# Patient Record
Sex: Female | Born: 2000 | Race: White | Hispanic: No | Marital: Single | Smoking: Never smoker
Health system: Southern US, Community
[De-identification: ages and names within clinical notes are randomized; demographics above are authoritative.]

## PROBLEM LIST (undated history)

## (undated) DIAGNOSIS — N390 Urinary tract infection, site not specified: Secondary | ICD-10-CM

## (undated) DIAGNOSIS — J45909 Unspecified asthma, uncomplicated: Secondary | ICD-10-CM

## (undated) DIAGNOSIS — O139 Gestational [pregnancy-induced] hypertension without significant proteinuria, unspecified trimester: Secondary | ICD-10-CM

## (undated) HISTORY — DX: Gestational (pregnancy-induced) hypertension without significant proteinuria, unspecified trimester: O13.9

---
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
## 2019-01-17 ENCOUNTER — Other Ambulatory Visit: Payer: Self-pay

## 2019-01-17 ENCOUNTER — Encounter: Payer: Self-pay | Admitting: Obstetrics and Gynecology

## 2019-01-17 ENCOUNTER — Other Ambulatory Visit (HOSPITAL_COMMUNITY)
Admission: RE | Admit: 2019-01-17 | Discharge: 2019-01-17 | Disposition: A | Discharge status: Home / Self Care | Payer: Medicaid Other | Source: Ambulatory Visit | Attending: Obstetrics and Gynecology | Admitting: Obstetrics and Gynecology

## 2019-01-17 ENCOUNTER — Ambulatory Visit (INDEPENDENT_AMBULATORY_CARE_PROVIDER_SITE_OTHER): Payer: Medicaid Other | Admitting: Obstetrics and Gynecology

## 2019-01-17 ENCOUNTER — Ambulatory Visit: Payer: Medicaid Other | Admitting: Obstetrics and Gynecology

## 2019-01-17 VITALS — BP 118/72 | HR 70 | Ht 63.0 in | Wt 169.0 lb

## 2019-01-17 DIAGNOSIS — N941 Unspecified dyspareunia: Secondary | ICD-10-CM | POA: Diagnosis not present

## 2019-01-17 NOTE — Progress Notes (Signed)
GYN presents for painful sex on deep penetration x 6 days.  Denies discharge, odor or abdominal pain.

## 2019-01-17 NOTE — Progress Notes (Signed)
18 yo P0 here for the evaluation of dyspareunia. Patient states that this started almost a week ago. She describes lower abdominal pain with deep penetration irrespective of sexual position. She also reports a similar pain prior to urination. Patient denies any abnormal vaginal bleeding or discharge. She denies any fever  Past Medical History:  Diagnosis Date  . UTI (urinary tract infection)    Past Surgical History:  Procedure Laterality Date  . TONSILLECTOMY AND ADENOIDECTOMY Bilateral 09/29/2012   Procedure: TONSILLECTOMY AND ADENOIDECTOMY;  Surgeon: Ruby Cola, MD;  Location: [facility] OR;  Service: ENT;  Laterality: Bilateral;   Family History  Problem Relation Age of Onset  . Cervical cancer Mother   . Ovarian cancer Maternal Grandmother    Social History   Tobacco Use  . Smoking status: Passive Smoke Exposure - Never Smoker  . Smokeless tobacco: Never Used  Substance Use Topics  . Alcohol use: No  . Drug use: No   ROS See pertinent in HPI  Blood pressure 118/72, pulse 70, height 5\' 3"  (1.6 m), weight 169 lb (76.7 kg), last menstrual period 12/17/2018. GENERAL: Well-developed, well-nourished female in no acute distress.  ABDOMEN: Soft, nontender, nondistended. No organomegaly. PELVIC: Normal external female genitalia. Vagina is pink and rugated.  Normal discharge. Normal appearing cervix. Uterus is normal in size. No adnexal mass or tenderness. EXTREMITIES: No cyanosis, clubbing, or edema, 2+ distal pulses.  A/P 18 yo with dysparunia - Urine culture and vaginal swab collected - Patient will be contacted with any abnormal results - May need to refer to pelvic PT if all results are negative - RTC prn

## 2019-01-19 LAB — URINE CULTURE

## 2019-01-25 LAB — CERVICOVAGINAL ANCILLARY ONLY
Bacterial Vaginitis (gardnerella): NEGATIVE
Candida Glabrata: NEGATIVE
Candida Vaginitis: NEGATIVE
Chlamydia: NEGATIVE
Comment: NEGATIVE
Comment: NEGATIVE
Comment: NEGATIVE
Comment: NEGATIVE
Comment: NEGATIVE
Comment: NORMAL
Neisseria Gonorrhea: NEGATIVE
Trichomonas: NEGATIVE

## 2019-01-26 NOTE — Addendum Note (Signed)
Addended by: Mora Bellman on: 01/26/2019 08:23 AM   Modules accepted: Orders

## 2019-01-29 ENCOUNTER — Ambulatory Visit: Payer: Medicaid Other | Admitting: Physical Therapy

## 2019-02-06 ENCOUNTER — Ambulatory Visit: Payer: Medicaid Other | Attending: Obstetrics and Gynecology | Admitting: Physical Therapy

## 2019-02-12 MED ORDER — SLYND 4 MG PO TABS: 1.0000 | ORAL_TABLET | Freq: Every day | ORAL | 4 refills | Status: DC

## 2019-03-16 DIAGNOSIS — Z3041 Encounter for surveillance of contraceptive pills: Secondary | ICD-10-CM | POA: Diagnosis not present

## 2019-03-16 DIAGNOSIS — L708 Other acne: Secondary | ICD-10-CM | POA: Diagnosis not present

## 2019-04-03 DIAGNOSIS — H16223 Keratoconjunctivitis sicca, not specified as Sjogren's, bilateral: Secondary | ICD-10-CM | POA: Diagnosis not present

## 2019-04-03 DIAGNOSIS — H40033 Anatomical narrow angle, bilateral: Secondary | ICD-10-CM | POA: Diagnosis not present

## 2019-04-05 DIAGNOSIS — H5213 Myopia, bilateral: Secondary | ICD-10-CM | POA: Diagnosis not present

## 2019-05-07 ENCOUNTER — Encounter (HOSPITAL_COMMUNITY): Payer: Self-pay | Admitting: *Deleted

## 2019-05-07 ENCOUNTER — Other Ambulatory Visit: Payer: Self-pay | Admitting: Advanced Practice Midwife

## 2019-05-07 ENCOUNTER — Inpatient Hospital Stay (HOSPITAL_COMMUNITY): Payer: Medicaid Other

## 2019-05-07 ENCOUNTER — Inpatient Hospital Stay (HOSPITAL_COMMUNITY)
Admission: EM | Admit: 2019-05-07 | Discharge: 2019-05-07 | Disposition: A | Discharge status: Home / Self Care | Payer: Medicaid Other | Attending: Obstetrics & Gynecology | Admitting: Obstetrics & Gynecology

## 2019-05-07 DIAGNOSIS — O26891 Other specified pregnancy related conditions, first trimester: Secondary | ICD-10-CM | POA: Insufficient documentation

## 2019-05-07 DIAGNOSIS — Z7722 Contact with and (suspected) exposure to environmental tobacco smoke (acute) (chronic): Secondary | ICD-10-CM | POA: Diagnosis not present

## 2019-05-07 DIAGNOSIS — Z3A01 Less than 8 weeks gestation of pregnancy: Secondary | ICD-10-CM

## 2019-05-07 DIAGNOSIS — O3680X0 Pregnancy with inconclusive fetal viability, not applicable or unspecified: Secondary | ICD-10-CM | POA: Diagnosis not present

## 2019-05-07 DIAGNOSIS — R109 Unspecified abdominal pain: Secondary | ICD-10-CM | POA: Insufficient documentation

## 2019-05-07 LAB — ABO/RH: ABO/RH(D): O NEG

## 2019-05-07 LAB — CBC
HCT: 40.4 % (ref 36.0–46.0)
Hemoglobin: 13.7 g/dL (ref 12.0–15.0)
MCH: 30.3 pg (ref 26.0–34.0)
MCHC: 33.9 g/dL (ref 30.0–36.0)
MCV: 89.4 fL (ref 80.0–100.0)
Platelets: 221 10*3/uL (ref 150–400)
RBC: 4.52 MIL/uL (ref 3.87–5.11)
RDW: 13.2 % (ref 11.5–15.5)
WBC: 7.7 10*3/uL (ref 4.0–10.5)
nRBC: 0 % (ref 0.0–0.2)

## 2019-05-07 LAB — POCT PREGNANCY, URINE: Preg Test, Ur: POSITIVE — AB

## 2019-05-07 LAB — URINALYSIS, ROUTINE W REFLEX MICROSCOPIC
Bilirubin Urine: NEGATIVE
Glucose, UA: NEGATIVE mg/dL
Hgb urine dipstick: NEGATIVE
Ketones, ur: NEGATIVE mg/dL
Leukocytes,Ua: NEGATIVE
Nitrite: NEGATIVE
Protein, ur: NEGATIVE mg/dL
Specific Gravity, Urine: 1.012 (ref 1.005–1.030)
pH: 7 (ref 5.0–8.0)

## 2019-05-07 LAB — HCG, QUANTITATIVE, PREGNANCY: hCG, Beta Chain, Quant, S: 2412 m[IU]/mL — ABNORMAL HIGH (ref <5)

## 2019-05-07 LAB — WET PREP, GENITAL
Clue Cells Wet Prep HPF POC: NONE SEEN
Sperm: NONE SEEN
Trich, Wet Prep: NONE SEEN
Yeast Wet Prep HPF POC: NONE SEEN

## 2019-05-07 NOTE — MAU Note (Signed)
Burgess Amor PA called for provider to provider report, took report on patient due to no provider being on the unit for report.

## 2019-05-07 NOTE — ED Triage Notes (Signed)
Pt states she is 5w 5d pregnant.  C/o lower abd cramping.  Denies vaginal bleeding.  Burgess Amor, PA at triage for [facility].  Report called to Marisue Humble in MAU and pt transported to MAU in wheelchair.

## 2019-05-07 NOTE — MAU Note (Signed)
.   Angela Calderon is a 19 y.o. at [redacted]w[redacted]d here in MAU reporting: 6 positive pregnancy tests at home. Lower abdominal cramping started yesterday morning at 11am. Denies any VB. Pt states that she has had high blood before when she was on birth control . LMP: 03/28/19 Onset of complaint: yesterday Pain score: 4 Vitals:   05/07/19 0729 05/07/19 0801  BP: 132/82 (!) 146/78  Pulse: 100 85  Resp: 16 16  Temp: 98.9 F (37.2 C) 98.8 F (37.1 C)  SpO2: 100%      FHT: Lab orders placed from triage: UA/UPT

## 2019-05-07 NOTE — Discharge Instructions (Signed)
Return to care   If you have heavier bleeding that soaks through more that 2 pads per hour for an hour or more  If you bleed so much that you feel like you might pass out or you do pass out  If you have significant abdominal pain that is not improved with Tylenol   If you develop a fever > 100.5     First Trimester of Pregnancy  The first trimester of pregnancy is from week 1 until the end of week 13 (months 1 through 3). During this time, your baby will begin to develop inside you. At 6-8 weeks, the eyes and face are formed, and the heartbeat can be seen on ultrasound. At the end of 12 weeks, all the baby's organs are formed. Prenatal care is all the medical care you receive before the birth of your baby. Make sure you get good prenatal care and follow all of your doctor's instructions. Follow these instructions at home: Medicines  Take over-the-counter and prescription medicines only as told by your doctor. Some medicines are safe and some medicines are not safe during pregnancy.  Take a prenatal vitamin that contains at least 600 micrograms (mcg) of folic acid.  If you have trouble pooping (constipation), take medicine that will make your stool soft (stool softener) if your doctor approves. Eating and drinking   Eat regular, healthy meals.  Your doctor will tell you the amount of weight gain that is right for you.  Avoid raw meat and uncooked cheese.  If you feel sick to your stomach (nauseous) or throw up (vomit): ? Eat 4 or 5 small meals a day instead of 3 large meals. ? Try eating a few soda crackers. ? Drink liquids between meals instead of during meals.  To prevent constipation: ? Eat foods that are high in fiber, like fresh fruits and vegetables, whole grains, and beans. ? Drink enough fluids to keep your pee (urine) clear or pale yellow. Activity  Exercise only as told by your doctor. Stop exercising if you have cramps or pain in your lower belly (abdomen) or low  back.  Do not exercise if it is too hot, too humid, or if you are in a place of great height (high altitude).  Try to avoid standing for long periods of time. Move your legs often if you must stand in one place for a long time.  Avoid heavy lifting.  Wear low-heeled shoes. Sit and stand up straight.  You can have sex unless your doctor tells you not to. Relieving pain and discomfort  Wear a good support bra if your breasts are sore.  Take warm water baths (sitz baths) to soothe pain or discomfort caused by hemorrhoids. Use hemorrhoid cream if your doctor says it is okay.  Rest with your legs raised if you have leg cramps or low back pain.  If you have puffy, bulging veins (varicose veins) in your legs: ? Wear support hose or compression stockings as told by your doctor. ? Raise (elevate) your feet for 15 minutes, 3-4 times a day. ? Limit salt in your food. Prenatal care  Schedule your prenatal visits by the twelfth week of pregnancy.  Write down your questions. Take them to your prenatal visits.  Keep all your prenatal visits as told by your doctor. This is important. Safety  Wear your seat belt at all times when driving.  Make a list of emergency phone numbers. The list should include numbers for family, friends, the hospital,  and police and fire departments. General instructions  Ask your doctor for a referral to a local prenatal class. Begin classes no later than at the start of month 6 of your pregnancy.  Ask for help if you need counseling or if you need help with nutrition. Your doctor can give you advice or tell you where to go for help.  Do not use hot tubs, steam rooms, or saunas.  Do not douche or use tampons or scented sanitary pads.  Do not cross your legs for long periods of time.  Avoid all herbs and alcohol. Avoid drugs that are not approved by your doctor.  Do not use any tobacco products, including cigarettes, chewing tobacco, and electronic  cigarettes. If you need help quitting, ask your doctor. You may get counseling or other support to help you quit.  Avoid cat litter boxes and soil used by cats. These carry germs that can cause birth defects in the baby and can cause a loss of your baby (miscarriage) or stillbirth.  Visit your dentist. At home, brush your teeth with a soft toothbrush. Be gentle when you floss. Contact a doctor if:  You are dizzy.  You have mild cramps or pressure in your lower belly.  You have a nagging pain in your belly area.  You continue to feel sick to your stomach, you throw up, or you have watery poop (diarrhea).  You have a bad smelling fluid coming from your vagina.  You have pain when you pee (urinate).  You have increased puffiness (swelling) in your face, hands, legs, or ankles. Get help right away if:  You have a fever.  You are leaking fluid from your vagina.  You have spotting or bleeding from your vagina.  You have very bad belly cramping or pain.  You gain or lose weight rapidly.  You throw up blood. It may look like coffee grounds.  You are around people who have [place] measles, fifth disease, or chickenpox.  You have a very bad headache.  You have shortness of breath.  You have any kind of trauma, such as from a fall or a car accident. Summary  The first trimester of pregnancy is from week 1 until the end of week 13 (months 1 through 3).  To take care of yourself and your unborn baby, you will need to eat healthy meals, take medicines only if your doctor tells you to do so, and do activities that are safe for you and your baby.  Keep all follow-up visits as told by your doctor. This is important as your doctor will have to ensure that your baby is healthy and growing well. This information is not intended to replace advice given to you by your health care provider. Make sure you discuss any questions you have with your health care provider. Document Revised:  07/20/2018 Document Reviewed: 04/06/2016 Elsevier Patient Education  2020 [employer].

## 2019-05-07 NOTE — MAU Provider Note (Signed)
Chief Complaint: Abdominal Pain   First Provider Initiated Contact with Patient 05/07/19 0818     SUBJECTIVE HPI: Angela Calderon is a 19 y.o. G1P0000 at [redacted]w[redacted]d who presents to Maternity Admissions reporting abdominal cramping. Symptoms started yesterday morning. Pain throughout lower abdomen. Last BM was 2 days ago; has not treated constipation. Vomited once this morning.  Denies dysuria, vaginal bleeding, or vaginal discharge.   Location: abdomen Quality: cramping Severity: 4/10 on pain scale Duration: 2 days Timing: intermittent Modifying factors: none. Hasn't treated symptoms.  Associated signs and symptoms: vomiting, constipation  Past Medical History:  Diagnosis Date  . UTI (urinary tract infection)    OB History  Gravida Para Term Preterm AB Living  1 0 0 0 0 0  SAB TAB Ectopic Multiple Live Births  0 0 0 0 0    # Outcome Date GA Lbr Len/2nd Weight Sex Delivery Anes PTL Lv  1 Current            Past Surgical History:  Procedure Laterality Date  . TONSILLECTOMY AND ADENOIDECTOMY Bilateral 09/29/2012   Procedure: TONSILLECTOMY AND ADENOIDECTOMY;  Surgeon: Melvenia Beam, MD;  Location: [facility] OR;  Service: ENT;  Laterality: Bilateral;   Social History   Socioeconomic History  . Marital status: Single    Spouse name: Not on file  . Number of children: Not on file  . Years of education: Not on file  . Highest education level: Not on file  Occupational History  . Not on file  Tobacco Use  . Smoking status: Passive Smoke Exposure - Never Smoker  . Smokeless tobacco: Never Used  Substance and Sexual Activity  . Alcohol use: No  . Drug use: No  . Sexual activity: Yes    Partners: Male    Birth control/protection: Pill  Other Topics Concern  . Not on file  Social History Narrative   ** Merged History Encounter **       Social Determinants of Health   Financial Resource Strain:   . Difficulty of Paying Living Expenses: Not on file  Food Insecurity:   . Worried  About Programme researcher, broadcasting/film/video in the Last Year: Not on file  . Ran Out of Food in the Last Year: Not on file  Transportation Needs:   . Lack of Transportation (Medical): Not on file  . Lack of Transportation (Non-Medical): Not on file  Physical Activity:   . Days of Exercise per Week: Not on file  . Minutes of Exercise per Session: Not on file  Stress:   . Feeling of Stress : Not on file  Social Connections:   . Frequency of Communication with Friends and Family: Not on file  . Frequency of Social Gatherings with Friends and Family: Not on file  . Attends Religious Services: Not on file  . Active Member of Clubs or Organizations: Not on file  . Attends Banker Meetings: Not on file  . Marital Status: Not on file  Intimate Partner Violence:   . Fear of Current or Ex-Partner: Not on file  . Emotionally Abused: Not on file  . Physically Abused: Not on file  . Sexually Abused: Not on file   Family History  Problem Relation Age of Onset  . Cervical cancer Mother   . Ovarian cancer Maternal Grandmother    No current facility-administered medications on file prior to encounter.   Current Outpatient Medications on File Prior to Encounter  Medication Sig Dispense Refill  . albuterol (VENTOLIN  HFA) 108 (90 Base) MCG/ACT inhaler albuterol sulfate HFA 90 mcg/actuation aerosol inhaler  TAKE 2 PUFFS BY MOUTH EVERY 4 HOURS    . Prenatal Vit-Fe Fumarate-FA (PRENATAL MULTIVITAMIN) TABS tablet Take 1 tablet by mouth daily at 12 noon.     No Known Allergies  I have reviewed patient's Past Medical Hx, Surgical Hx, Family Hx, Social Hx, medications and allergies.   Review of Systems  Constitutional: Negative.   Gastrointestinal: Positive for abdominal pain, constipation and vomiting. Negative for diarrhea and nausea.  Genitourinary: Negative.     OBJECTIVE Patient Vitals for the past 24 hrs:  BP Temp Temp src Pulse Resp SpO2 Height Weight  05/07/19 1125 140/74 - - 80 16 - - -   05/07/19 0811 132/78 - - 86 - - - -  05/07/19 0802 - - - 93 - - - -  05/07/19 0801 (!) 146/78 98.8 F (37.1 C) - 85 16 - 5\' 3"  (1.6 m) 74.4 kg  05/07/19 0729 132/82 98.9 F (37.2 C) Oral 100 16 100 % - -   Constitutional: Well-developed, well-nourished female in no acute distress.  Cardiovascular: normal rate & rhythm, no murmur Respiratory: normal rate and effort. Lung sounds clear throughout GI: Abd soft, non-tender, Pos BS x 4. No guarding or rebound tenderness MS: Extremities nontender, no edema, normal ROM Neurologic: Alert and oriented x 4.  GU: NEFG, no blood     LAB RESULTS Results for orders placed or performed during the hospital encounter of 05/07/19 (from the past 24 hour(s))  Urinalysis, Routine w reflex microscopic     Status: Abnormal   Collection Time: 05/07/19  7:56 AM  Result Value Ref Range   Color, Urine YELLOW YELLOW   APPearance HAZY (A) CLEAR   Specific Gravity, Urine 1.012 1.005 - 1.030   pH 7.0 5.0 - 8.0   Glucose, UA NEGATIVE NEGATIVE mg/dL   Hgb urine dipstick NEGATIVE NEGATIVE   Bilirubin Urine NEGATIVE NEGATIVE   Ketones, ur NEGATIVE NEGATIVE mg/dL   Protein, ur NEGATIVE NEGATIVE mg/dL   Nitrite NEGATIVE NEGATIVE   Leukocytes,Ua NEGATIVE NEGATIVE  Pregnancy, urine POC     Status: Abnormal   Collection Time: 05/07/19  8:02 AM  Result Value Ref Range   Preg Test, Ur POSITIVE (A) NEGATIVE  Wet prep, genital     Status: Abnormal   Collection Time: 05/07/19  8:31 AM  Result Value Ref Range   Yeast Wet Prep HPF POC NONE SEEN NONE SEEN   Trich, Wet Prep NONE SEEN NONE SEEN   Clue Cells Wet Prep HPF POC NONE SEEN NONE SEEN   WBC, Wet Prep HPF POC MANY (A) NONE SEEN   Sperm NONE SEEN   CBC     Status: None   Collection Time: 05/07/19  9:47 AM  Result Value Ref Range   WBC 7.7 4.0 - 10.5 K/uL   RBC 4.52 3.87 - 5.11 MIL/uL   Hemoglobin 13.7 12.0 - 15.0 g/dL   HCT 40.4 36.0 - 46.0 %   MCV 89.4 80.0 - 100.0 fL   MCH 30.3 26.0 - 34.0 pg   MCHC 33.9  30.0 - 36.0 g/dL   RDW 13.2 11.5 - 15.5 %   Platelets 221 150 - 400 K/uL   nRBC 0.0 0.0 - 0.2 %  ABO/Rh     Status: None   Collection Time: 05/07/19  9:47 AM  Result Value Ref Range   ABO/RH(D)      O NEG Performed at [facility], [address], [place], [place] [address]   hCG, quantitative, pregnancy     Status: Abnormal   Collection Time: 05/07/19  9:47 AM  Result Value Ref Range   hCG, Beta Chain, Quant, S 2,412 (H) <5 mIU/mL    IMAGING US OB LESS THAN 14 WEEKS WITH OB TRANSVAGINAL  Result Date: 05/07/2019 CLINICAL DATA:  Abdominal pain and first-trimester pregnancy EXAM: OBSTETRIC <14 WK [place] AND TRANSVAGINAL OB US TECHNIQUE: Both transabdominal and transvaginal ultrasound examinations were performed for complete evaluation of the gestation as well as the maternal uterus, adnexal regions, and pelvic cul-de-sac. Transvaginal technique was performed to assess early pregnancy. COMPARISON:  None. FINDINGS: Intrauterine gestational sac: Presumed and single Yolk sac:  Not seen Embryo:  Not seen MSD: 4.58 mm   5 w   1 d Subchorionic hemorrhage:  None visualized. Maternal uterus/adnexae: Normal IMPRESSION: Probable intrauterine gestational sac but no yolk sac or fetal pole yet visualized. Mean sac diameter would correlate with 5 weeks 1 day. Probable early intrauterine gestational sac, but no yolk sac, fetal pole, or cardiac activity yet visualized. Recommend follow-up quantitative B-HCG levels and follow-up US in 14 days to assess viability. This recommendation follows SRU consensus guidelines: Diagnostic Criteria for Nonviable Pregnancy Early in the First Trimester. Malva Limes Med 2013; 016:0109-32. Electronically Signed   By: Marnee Spring M.D.   On: 05/07/2019 09:03    MAU COURSE Orders Placed This Encounter  Procedures  . Wet prep, genital  . US OB LESS THAN 14 WEEKS WITH OB TRANSVAGINAL  . Urinalysis, Routine w reflex microscopic  . CBC  . hCG, quantitative, pregnancy  .  Pregnancy, urine POC  . ABO/Rh  . Discharge patient   No orders of the defined types were placed in this encounter.   MDM +UPT UA, wet prep, GC/chlamydia, CBC, ABO/Rh, quant hCG, and [place] today to rule out ectopic pregnancy which can be life threatening.   RH negative. No bleeding  Ultrasound shows possible empty IUGS. HCG is 2412 This abdominal pain could represent a normal pregnancy, spontaneous abortion, or even an ectopic pregnancy which can be life-threatening. Cultures were obtained to rule out pelvic infection.     ASSESSMENT 1. Pregnancy of unknown anatomic location   2. Abdominal pain during pregnancy in first trimester     PLAN Discharge home in stable condition. SAB vs ectopic precautions GC/CT pending Msg to [facility] for stat HCG on Wednesday  Follow-up Information    [facility] Follow up.   Specialty: Obstetrics and Gynecology Why: the office will call you for an appointment on Wednesday Contact information: [address] [place] [address] [phone]       [facility] Follow up.   Specialty: Obstetrics and Gynecology Why: return for worsening symptoms Contact information: [address] [id number] mc [place] [address] [phone]         Allergies as of 05/07/2019   No Known Allergies     Medication List    STOP taking these medications   cephALEXin 500 MG capsule Commonly known as: KEFLEX   famotidine 20 MG tablet Commonly known as: PEPCID   ibuprofen 600 MG tablet Commonly known as: ADVIL   metroNIDAZOLE 500 MG tablet Commonly known as: Flagyl   norethindrone 0.35 MG tablet Commonly known as: MICRONOR   norethindrone-ethinyl estradiol 1/35 tablet Commonly known as: Ortho-Novum 1/35 (28)   phenazopyridine 200 MG tablet Commonly known  as: Pyridium   Slynd 4 MG Tabs Generic drug: Drospirenone     TAKE these  medications   albuterol 108 (90 Base) MCG/ACT inhaler Commonly known as: VENTOLIN HFA albuterol sulfate HFA 90 mcg/actuation aerosol inhaler  TAKE 2 PUFFS BY MOUTH EVERY 4 HOURS   prenatal multivitamin Tabs tablet Take 1 tablet by mouth daily at 12 noon.        Judeth Horn, NP 05/07/2019  3:46 PM

## 2019-05-07 NOTE — ED Provider Notes (Signed)
MSE was initiated and I personally evaluated the patient and placed orders (if any) at  7:45 AM on May 07, 2019.  The patient appears stable so that the remainder of the MSE may be completed by another provider.  Pt presents to main Ed with complaints of low pelvic cramping, increased flatulence.  Denies vaginal discharge or spotting.  [redacted]w[redacted]d per LMP and home preg tests.  Has not yet established prenatal care.  No dizziness, n/v or other complaint.    PE: VSS, Borderline tachy, no respiratory distress.    Appropriate for transfer to MAU.  Call to MAU.  Unable to speak to provider, was told was in OR.  Advised pt being transferred to them.    Burgess Amor, PA-C 05/07/19 7939    Tegeler, Canary Brim, MD 05/07/19 2010

## 2019-05-08 ENCOUNTER — Other Ambulatory Visit: Payer: Self-pay

## 2019-05-08 ENCOUNTER — Encounter (HOSPITAL_COMMUNITY): Payer: Self-pay | Admitting: Family Medicine

## 2019-05-08 ENCOUNTER — Inpatient Hospital Stay (HOSPITAL_COMMUNITY)
Admission: AD | Admit: 2019-05-08 | Discharge: 2019-05-08 | Disposition: A | Discharge status: Home / Self Care | Payer: Medicaid Other | Attending: Family Medicine | Admitting: Family Medicine

## 2019-05-08 DIAGNOSIS — Z3A01 Less than 8 weeks gestation of pregnancy: Secondary | ICD-10-CM | POA: Diagnosis not present

## 2019-05-08 DIAGNOSIS — O3680X0 Pregnancy with inconclusive fetal viability, not applicable or unspecified: Secondary | ICD-10-CM | POA: Diagnosis not present

## 2019-05-08 DIAGNOSIS — O034 Incomplete spontaneous abortion without complication: Secondary | ICD-10-CM | POA: Diagnosis not present

## 2019-05-08 DIAGNOSIS — O209 Hemorrhage in early pregnancy, unspecified: Secondary | ICD-10-CM | POA: Diagnosis not present

## 2019-05-08 DIAGNOSIS — Z6791 Unspecified blood type, Rh negative: Secondary | ICD-10-CM | POA: Insufficient documentation

## 2019-05-08 DIAGNOSIS — Z7722 Contact with and (suspected) exposure to environmental tobacco smoke (acute) (chronic): Secondary | ICD-10-CM | POA: Diagnosis not present

## 2019-05-08 DIAGNOSIS — O26899 Other specified pregnancy related conditions, unspecified trimester: Secondary | ICD-10-CM

## 2019-05-08 DIAGNOSIS — O039 Complete or unspecified spontaneous abortion without complication: Secondary | ICD-10-CM | POA: Insufficient documentation

## 2019-05-08 LAB — GC/CHLAMYDIA PROBE AMP (~~LOC~~) NOT AT ARMC
Chlamydia: NEGATIVE
Comment: NEGATIVE
Comment: NORMAL
Neisseria Gonorrhea: NEGATIVE

## 2019-05-08 LAB — HCG, QUANTITATIVE, PREGNANCY: hCG, Beta Chain, Quant, S: 1858 m[IU]/mL — ABNORMAL HIGH (ref <5)

## 2019-05-08 MED ORDER — RHO D IMMUNE GLOBULIN 1500 UNIT/2ML IJ SOSY
300.0000 ug | PREFILLED_SYRINGE | Freq: Once | INTRAMUSCULAR | Status: AC
  Administered 2019-05-08: 22:00:00 300 ug via INTRAMUSCULAR
  Filled 2019-05-08: qty 2

## 2019-05-08 MED ORDER — MISOPROSTOL 200 MCG PO TABS: ORAL_TABLET | ORAL | 1 refills | Status: DC

## 2019-05-08 MED ORDER — PROMETHAZINE HCL 25 MG PO TABS: 25.0000 mg | ORAL_TABLET | Freq: Four times a day (QID) | ORAL | 2 refills | Status: DC | PRN

## 2019-05-08 MED ORDER — IBUPROFEN 600 MG PO TABS: 600.0000 mg | ORAL_TABLET | Freq: Four times a day (QID) | ORAL | 1 refills | Status: DC | PRN

## 2019-05-08 MED ORDER — OXYCODONE-ACETAMINOPHEN 5-325 MG PO TABS: 1.0000 | ORAL_TABLET | Freq: Four times a day (QID) | ORAL | 0 refills | Status: DC | PRN

## 2019-05-08 NOTE — Discharge Instructions (Signed)
Incomplete Miscarriage A miscarriage is the loss of an unborn baby (fetus) before the 20th week of pregnancy. In an incomplete miscarriage, parts of the fetus or placenta (afterbirth) remain in the body. Most miscarriages happen in the first 3 months of pregnancy. Sometimes, it happens before a woman even knows she is pregnant. Having a miscarriage can be an emotional experience. If you have had a miscarriage, talk with your health care provider about any questions you may have about miscarrying, the grieving process, and your future pregnancy plans. What are the causes? This condition may be caused by:  Problems with the genes or chromosomes that make it impossible for the baby to develop normally. These problems are most often the result of random errors that occur early in development, and are not passed from parent to child (not inherited).  Infection of the cervix or uterus.  Conditions that affect hormone balance in the body.  Problems with the cervix, such as the cervix opening and thinning before pregnancy is at term (cervical insufficiency).  Problems with the uterus, such as a uterus with an abnormal shape, fibroids in the uterus, or problems that were present from birth (congenital abnormalities).  Certain medical conditions.  Smoking, drinking alcohol, or using drugs.  Injury (trauma). Many times, the cause of a miscarriage is not known. What are the signs or symptoms? Symptoms of this condition include:  Vaginal bleeding or spotting, with or without cramps or pain.  Pain or cramping in the abdomen or lower back.  Passing fluid, tissue, or blood clots from the vagina. How is this diagnosed? This condition may be diagnosed based on:  A physical exam.  Ultrasound.  Blood tests.  Urine tests. How is this treated? An incomplete miscarriage may be treated with:  Dilation and curettage (D&C). This is a procedure in which the cervix is stretched open and the lining of  the uterus (endometrium) is scraped to remove any remaining tissue from the pregnancy.  Medicines, such as: ? Antibiotic medicine to treat infection. ? Medicine to help any remaining tissue pass out of your uterus. ? Medicine to reduce (contract) the size of the uterus. These medicines may be given if you have a lot of bleeding. If you have Rh negative blood and your baby was Rh positive, you will need a shot of medicine called Rh immunoglobulinto protect future babies from Rh blood problems. "Rh-negative" and "Rh-positive" refer to whether or not the blood has a specific protein found on the surface of red blood cells (Rh factor). Follow these instructions at home: Medicines   Take over-the-counter and prescription medicines only as told by your health care provider.  If you were prescribed antibiotic medicine, take your antibiotic as told by your health care provider. Do not stop taking the antibiotic even if you start to feel better.  Do not take NSAIDs, such as aspirin and ibuprofen, unless approved by your doctor. These medicines can cause bleeding. Activity  Rest as directed. Ask your health care provider what activities are safe for you.  Have someone help with home and family responsibilities during this time. General instructions  Keep track of the number of sanitary pads you use each day and how soaked (saturated) they are. Write down this information.  Monitor the amount of tissue or blood clots that you pass from your vagina. Save any large amounts of tissue for your health care provider to examine.  Do not use tampons, douche, or have sex until your health care provider   approves.  To help you and your partner with the process of grieving, talk with your health care provider or seek counseling to help cope with the pregnancy loss.  When you are ready, meet with your health care provider to discuss important steps you should take for your health, as well as steps to take in  order to have a healthy pregnancy in the future.  Keep all follow-up visits as told by your health care provider. This is important. Where to find more information  The American Congress of Obstetricians and Gynecologists: www.acog.org  [place] Department of Health and Human Services Office of Women's Health: www.womenshealth.gov Contact a health care provider if:  You have a fever or chills.  You have a foul smelling vaginal discharge. Get help right away if:  You have severe cramps or pain in your back or abdomen.  You pass walnut-sized (or larger) blood clots or tissue from your vagina.  You have heavy bleeding, soaking more than 1 regular sanitary pad in an hour.  You become lightheaded or weak.  You pass out.  You have feelings of sadness that take over your thoughts, or you have thoughts of hurting yourself. Summary  In an incomplete miscarriage, parts of the fetus or placenta (afterbirth) remain in the body.  There are multiple treatment options for an incomplete miscarriage, talk to your health care provider about the best option for you.  Follow your health care provider's instructions for follow-up care.  To help you and your partner with the process of grieving, talk with your health care provider or seek counseling to help cope with the pregnancy loss. This information is not intended to replace advice given to you by your health care provider. Make sure you discuss any questions you have with your health care provider. Document Revised: 05/05/2017 Document Reviewed: 05/05/2016 Elsevier Patient Education  2020 [employer]  

## 2019-05-08 NOTE — MAU Provider Note (Signed)
Chief Complaint: Abdominal Pain and Vaginal Bleeding   First Provider Initiated Contact with Patient 05/08/19 2011        SUBJECTIVE HPI: Angela Calderon is a 19 y.o. G1P0000 at [redacted]w[redacted]d by LMP who presents to maternity admissions reporting vaginal bleeding for the past hour.  Soaked clothing.  Mild cramping.  Was seen here yesterday for cramping and found to have a small gestational sac without yolk sac or embryo.. She denies vaginal itching/burning, urinary symptoms, h/a, dizziness, n/v, or fever/chills.    Abdominal Pain This is a recurrent problem. The current episode started in the past 7 days. The problem occurs intermittently. The problem has been unchanged. The pain is mild. The quality of the pain is cramping. The abdominal pain does not radiate. Pertinent negatives include no constipation, diarrhea, dysuria, fever, frequency, nausea or vomiting. Nothing aggravates the pain. The pain is relieved by nothing. She has tried nothing for the symptoms.  Vaginal Bleeding The patient's primary symptoms include pelvic pain and vaginal bleeding. The patient's pertinent negatives include no genital itching, genital lesions, genital odor or vaginal discharge. This is a new problem. The current episode started today. The problem occurs intermittently. The pain is mild. She is pregnant. Associated symptoms include abdominal pain. Pertinent negatives include no constipation, diarrhea, dysuria, fever, frequency, nausea or vomiting. The vaginal discharge was bloody. The vaginal bleeding is lighter than menses. She has been passing clots. She has not been passing tissue. Nothing aggravates the symptoms. She has tried nothing for the symptoms.   RN Note: Stared having bleeding approximately 1 hour ago, dark red blood.  Not wearing a pad but it has soaked through her clothes.  Reports she's feeling a light amount of pain-constant pain.  Cramping has been going on for 2 hours  Past Medical History:  Diagnosis  Date  . UTI (urinary tract infection)    Past Surgical History:  Procedure Laterality Date  . TONSILLECTOMY AND ADENOIDECTOMY Bilateral 09/29/2012   Procedure: TONSILLECTOMY AND ADENOIDECTOMY;  Surgeon: Ruby Cola, MD;  Location: [place];  Service: ENT;  Laterality: Bilateral;   Social History   Socioeconomic History  . Marital status: Single    Spouse name: Not on file  . Number of children: Not on file  . Years of education: Not on file  . Highest education level: Not on file  Occupational History  . Not on file  Tobacco Use  . Smoking status: Passive Smoke Exposure - Never Smoker  . Smokeless tobacco: Never Used  Substance and Sexual Activity  . Alcohol use: No  . Drug use: No  . Sexual activity: Yes    Partners: Male    Birth control/protection: Pill  Other Topics Concern  . Not on file  Social History Narrative   ** Merged History Encounter **       Social Determinants of Health   Financial Resource Strain:   . Difficulty of Paying Living Expenses: Not on file  Food Insecurity:   . Worried About Charity fundraiser in the Last Year: Not on file  . Ran Out of Food in the Last Year: Not on file  Transportation Needs:   . Lack of Transportation (Medical): Not on file  . Lack of Transportation (Non-Medical): Not on file  Physical Activity:   . Days of Exercise per Week: Not on file  . Minutes of Exercise per Session: Not on file  Stress:   . Feeling of Stress : Not on file  Social Connections:   .  Frequency of Communication with Friends and Family: Not on file  . Frequency of Social Gatherings with Friends and Family: Not on file  . Attends Religious Services: Not on file  . Active Member of Clubs or Organizations: Not on file  . Attends Banker Meetings: Not on file  . Marital Status: Not on file  Intimate Partner Violence:   . Fear of Current or Ex-Partner: Not on file  . Emotionally Abused: Not on file  . Physically Abused: Not on file  .  Sexually Abused: Not on file   No current facility-administered medications on file prior to encounter.   Current Outpatient Medications on File Prior to Encounter  Medication Sig Dispense Refill  . albuterol (VENTOLIN HFA) 108 (90 Base) MCG/ACT inhaler albuterol sulfate HFA 90 mcg/actuation aerosol inhaler  TAKE 2 PUFFS BY MOUTH EVERY 4 HOURS    . Prenatal Vit-Fe Fumarate-FA (PRENATAL MULTIVITAMIN) TABS tablet Take 1 tablet by mouth daily at 12 noon.     No Known Allergies  I have reviewed patient's Past Medical Hx, Surgical Hx, Family Hx, Social Hx, medications and allergies.   ROS:  Review of Systems  Constitutional: Negative for fever.  Gastrointestinal: Positive for abdominal pain. Negative for constipation, diarrhea, nausea and vomiting.  Genitourinary: Positive for pelvic pain and vaginal bleeding. Negative for dysuria, frequency and vaginal discharge.   Review of Systems  Other systems negative   Physical Exam  Physical Exam Patient Vitals for the past 24 hrs:  BP Temp Temp src Pulse Resp SpO2 Weight  05/08/19 1940 135/85 98.9 F (37.2 C) -- 99 19 100 % --  05/08/19 1935 -- -- -- -- -- -- 74.6 kg  05/08/19 1913 (!) 154/98 98.8 F (37.1 C) Oral (!) 121 -- 100 % --   Constitutional: Well-developed, well-nourished female in no acute distress.  Cardiovascular: normal rate Respiratory: normal effort GI: Abd soft, non-tender. Pos BS x 4 MS: Extremities nontender, no edema, normal ROM Neurologic: Alert and oriented x 4.  GU: Neg CVAT.  PELVIC EXAM: Cervix pink, visually closed, without lesion, small to moderate clotted blood in vault,  vaginal walls and external genitalia normal   LAB RESULTS Results for orders placed or performed during the hospital encounter of 05/08/19 (from the past 24 hour(s))  hCG, quantitative, pregnancy     Status: Abnormal   Collection Time: 05/08/19  8:30 PM  Result Value Ref Range   hCG, Beta Chain, Quant, S 1,858 (H) <5 mIU/mL  Rh IG  workup (includes ABO/Rh)     Status: None (Preliminary result)   Collection Time: 05/08/19  8:30 PM  Result Value Ref Range   Gestational Age(Wks) 6    ABO/RH(D) O NEG    Antibody Screen      NEG Performed at [facility], [address], [place], [place] [address]    Unit Number [id number]    Blood Component Type RHIG    Unit division 00    Status of Unit ALLOCATED    Transfusion Status OK TO TRANSFUSE     Ref. Range 05/07/2019 09:47  HCG, Beta Chain, Quant, S Latest Ref Range: <5 mIU/mL 2,412 (H)    --/--/O NEG (01/26 2030)  IMAGING DONE YESTERDAY US OB LESS THAN 14 WEEKS WITH OB TRANSVAGINAL  Result Date: 05/07/2019 CLINICAL DATA:  Abdominal pain and first-trimester pregnancy EXAM: OBSTETRIC <14 WK [place] AND TRANSVAGINAL OB US TECHNIQUE: Both transabdominal and transvaginal ultrasound examinations were performed for complete evaluation of the gestation  as well as the maternal uterus, adnexal regions, and pelvic cul-de-sac. Transvaginal technique was performed to assess early pregnancy. COMPARISON:  None. FINDINGS: Intrauterine gestational sac: Presumed and single Yolk sac:  Not seen Embryo:  Not seen MSD: 4.58 mm   5 w   1 d Subchorionic hemorrhage:  None visualized. Maternal uterus/adnexae: Normal IMPRESSION: Probable intrauterine gestational sac but no yolk sac or fetal pole yet visualized. Mean sac diameter would correlate with 5 weeks 1 day. Probable early intrauterine gestational sac, but no yolk sac, fetal pole, or cardiac activity yet visualized. Recommend follow-up quantitative B-HCG levels and follow-up US in 14 days to assess viability. This recommendation follows SRU consensus guidelines: Diagnostic Criteria for Nonviable Pregnancy Early in the First Trimester. Malva Limes Med 2013; 749:4496-75. Electronically Signed   By: Marnee Spring M.D.   On: 05/07/2019 09:03    MAU Management/MDM: Ordered Quant HCG and Rhophylac workup   Hemoglobin was 13.7 yesterday so nor  repeated. This bleeding/pain can represent a normal pregnancy with bleeding, spontaneous abortion or even an ectopic which can be life-threatening.  The process as listed above helps to determine which of these is present.  Discussed findings of decreasing HCG level indicates an Inevitable SAB Offered options of expectant management vs. Cytotec.  She works 60 hrs/week and wants to get it overwith so will use Cytotec Discussed process and medications involved Consult Dr Adrian Blackwater re: plan of care.  Since we never saw a yolk sac, cannot definitively rule out ectopic pregnancy.  Considered Methotrexate but will proceed with CYtotec and recheck HCG late this week to see how well it drops    If it only drops small amount, will need Methotrexate.   WIll give Rhophylac before she leaves  ASSESSMENT Pregnancy at [redacted]w[redacted]d Inevitable Spontaneous abortion Rh Negative status   PLAN Discharge home Plan to repeat HCG level in 2 days to see how far it drops .  If it drops only a small amount, will need methotrexate.   Otherwise follow weekly until 0. Rhophylac today. Pt stable at time of discharge. Encouraged to return here or to other Urgent Care/ED if she develops worsening of symptoms, increase in pain, fever, or other concerning symptoms.    Wynelle Bourgeois CNM, MSN Certified Nurse-Midwife 05/08/2019  10:05 PM

## 2019-05-08 NOTE — MAU Note (Signed)
Stared having bleeding approximately 1 hour ago, dark red blood.  Not wearing a pad but it has soaked through her clothes.  Reports she's feeling a light amount of pain-constant pain.  Cramping has been going on for 2 hours.

## 2019-05-08 NOTE — ED Provider Notes (Signed)
MSE was initiated and I personally evaluated the patient and placed orders (if any) at  7:17 PM on May 08, 2019.  19 year old female reportedly [redacted] weeks pregnant seen at MAU yesterday for pelvic cramping returns today for worsening pelvic cramping (not one-sided) with bleeding.  Patient reports about an hour ago she felt like she was peeing on herself and noticed it was blood.  States it is not heavy but is also not light spotting it is somewhere in between, not currently using pads or tampons.  Patient is tearful on exam and visibly upset, she is tachycardic with a heart rate of 120, blood pressure is elevated.  Discussed with MAU provider, patient to go to MAU for evaluation.  The patient appears stable so that the remainder of the MSE may be completed by another provider.   Jeannie Fend, PA-C 05/08/19 1918    Clarene Duke Ambrose Finland, MD 05/08/19 [phone]

## 2019-05-09 ENCOUNTER — Other Ambulatory Visit: Payer: Medicaid Other

## 2019-05-09 LAB — RH IG WORKUP (INCLUDES ABO/RH)
ABO/RH(D): O NEG
Antibody Screen: NEGATIVE
Gestational Age(Wks): 6
Unit division: 0

## 2019-05-11 ENCOUNTER — Ambulatory Visit: Payer: Medicaid Other

## 2019-05-11 ENCOUNTER — Other Ambulatory Visit: Payer: Self-pay

## 2019-05-11 VITALS — BP 118/75 | HR 76 | Wt 164.9 lb

## 2019-05-11 DIAGNOSIS — O3680X0 Pregnancy with inconclusive fetal viability, not applicable or unspecified: Secondary | ICD-10-CM | POA: Diagnosis not present

## 2019-05-11 LAB — BETA HCG QUANT (REF LAB): hCG Quant: 317 m[IU]/mL

## 2019-05-11 NOTE — Progress Notes (Signed)
Pt is in the office for stat hcg, pt denies pain and reports bleeding has slowed down a lot and is close to stopping. Pt states that emotionally she is doing better and is able to process and accept what is happening Pt was advised that she will be called with results.

## 2019-05-11 NOTE — Progress Notes (Signed)
Results for orders placed or performed in visit on 05/11/19 (from the past 24 hour(s))  Beta hCG quant (ref lab)     Status: None   Collection Time: 05/11/19  8:56 AM  Result Value Ref Range   hCG Quant 317 mIU/mL   Narrative   Performed at:  01 - [facility] [address]  [address], [place], [place]  [id number] Lab Director: Mila Homer MD, Phone:  [phone]   HCG results today indicate SAB as expected from last MAU visit. Dr. Jolayne Panther reviewed results and has communicated this to [facility] to update the patient.   Marny Lowenstein, PA-C 05/11/2019 2:49 PM

## 2019-05-12 DIAGNOSIS — Z20828 Contact with and (suspected) exposure to other viral communicable diseases: Secondary | ICD-10-CM | POA: Diagnosis not present

## 2019-05-14 ENCOUNTER — Telehealth: Payer: Self-pay

## 2019-05-14 NOTE — Telephone Encounter (Signed)
LVM for patient to give [place] a call back regarding lab results.

## 2019-05-14 NOTE — Telephone Encounter (Signed)
Informed pt to come in once out of quarantine (beginning on 2/8) for another lab only visit to follow HCG down to 0. Pt voices understanding and reports she will call back to schedule this.

## 2019-05-19 DIAGNOSIS — Z03818 Encounter for observation for suspected exposure to other biological agents ruled out: Secondary | ICD-10-CM | POA: Diagnosis not present

## 2019-05-19 DIAGNOSIS — Z20828 Contact with and (suspected) exposure to other viral communicable diseases: Secondary | ICD-10-CM | POA: Diagnosis not present

## 2019-05-21 ENCOUNTER — Other Ambulatory Visit: Payer: Medicaid Other

## 2019-05-22 ENCOUNTER — Other Ambulatory Visit: Payer: Medicaid Other

## 2019-06-06 ENCOUNTER — Encounter: Payer: Medicaid Other | Admitting: Advanced Practice Midwife

## 2019-06-10 DIAGNOSIS — H1013 Acute atopic conjunctivitis, bilateral: Secondary | ICD-10-CM | POA: Diagnosis not present

## 2019-08-23 ENCOUNTER — Ambulatory Visit: Payer: Medicaid Other | Admitting: Advanced Practice Midwife

## 2020-05-01 ENCOUNTER — Other Ambulatory Visit: Payer: Self-pay

## 2020-05-01 ENCOUNTER — Inpatient Hospital Stay (HOSPITAL_COMMUNITY)
Admission: AD | Admit: 2020-05-01 | Discharge: 2020-05-01 | Disposition: A | Discharge status: Home / Self Care | Payer: Medicaid Other | Attending: Obstetrics & Gynecology | Admitting: Obstetrics & Gynecology

## 2020-05-01 DIAGNOSIS — Z3202 Encounter for pregnancy test, result negative: Secondary | ICD-10-CM | POA: Diagnosis not present

## 2020-05-01 LAB — HCG, SERUM, QUALITATIVE: Preg, Serum: NEGATIVE

## 2020-05-01 LAB — POCT PREGNANCY, URINE: Preg Test, Ur: NEGATIVE

## 2020-05-01 NOTE — MAU Provider Note (Signed)
Event Date/Time  First Provider Initiated Contact with Patient 05/01/20 2137     S [name] is a 20 y.o. G1P0000 patient who presents to MAU today requesting pregnancy confirmation and Rhogam. Most recent LMP 04/12/2020. Unprotected sex 01/01 and 01/05 then took Plan B on 01/06. Patient had unprotected sex on 01/11 and experienced bleeding 01/12. She is concerned for pregnancy and states she was told that she would need Rhogam each trimester. She endorses multiple home pregnancy tests, some negative and some positive. She denies abdominal pain, vaginal bleeding, nausea, vomiting, breast tenderness, dysuria, fever, recent illness.  Patient is s/p SAB diagnosed and managed exactly one year ago and is concerned that she will have a traumatic miscarriage experience on the one year anniversary of her loss.  O BP 137/90 (BP Location: Right Arm)    Pulse (!) 102    Resp 16    SpO2 100%    Physical Exam Vitals and nursing note reviewed. Exam conducted with a chaperone present.  Constitutional:      Appearance: Normal appearance.  Cardiovascular:     Rate and Rhythm: Normal rate.     Pulses: Normal pulses.  Pulmonary:     Effort: Pulmonary effort is normal.     Breath sounds: Normal breath sounds.  Abdominal:     General: Abdomen is flat.     Tenderness: There is no abdominal tenderness.  Skin:    Capillary Refill: Capillary refill takes less than 2 seconds.  Neurological:     Mental Status: She is alert and oriented to person, place, and time.    A Medical screening exam complete Negative urine pregnancy test Negative qualitative serum (ordered due to absence of symptoms s/p discussion with Dr. Crissie Calderon)  P Discharge from MAU in stable condition Patient may return to MAU as needed for pregnancy-related concerns  Angela Calderon 05/01/2020 11:54 PM

## 2020-05-01 NOTE — MAU Note (Signed)
Pt reports positive and negative home pregnancy tests.  Presents to MAU for evaluation of possible pregnancy.  Pt denies any vaginal bleeding.

## 2020-05-01 NOTE — Discharge Instructions (Signed)

## 2020-07-08 DIAGNOSIS — N9089 Other specified noninflammatory disorders of vulva and perineum: Secondary | ICD-10-CM | POA: Diagnosis not present

## 2020-09-05 ENCOUNTER — Other Ambulatory Visit: Payer: Self-pay

## 2020-09-05 ENCOUNTER — Encounter: Payer: Self-pay | Admitting: Obstetrics and Gynecology

## 2020-09-05 ENCOUNTER — Ambulatory Visit (INDEPENDENT_AMBULATORY_CARE_PROVIDER_SITE_OTHER): Payer: Medicaid Other | Admitting: Obstetrics and Gynecology

## 2020-09-05 ENCOUNTER — Other Ambulatory Visit (HOSPITAL_COMMUNITY)
Admission: RE | Admit: 2020-09-05 | Discharge: 2020-09-05 | Disposition: A | Discharge status: Home / Self Care | Payer: Medicaid Other | Source: Ambulatory Visit | Attending: Obstetrics and Gynecology | Admitting: Obstetrics and Gynecology

## 2020-09-05 DIAGNOSIS — N898 Other specified noninflammatory disorders of vagina: Secondary | ICD-10-CM

## 2020-09-05 DIAGNOSIS — Z30011 Encounter for initial prescription of contraceptive pills: Secondary | ICD-10-CM | POA: Diagnosis not present

## 2020-09-05 DIAGNOSIS — Z309 Encounter for contraceptive management, unspecified: Secondary | ICD-10-CM

## 2020-09-05 HISTORY — DX: Encounter for contraceptive management, unspecified: Z30.9

## 2020-09-05 MED ORDER — NORETHIN ACE-ETH ESTRAD-FE 1-20 MG-MCG PO TABS: 1.0000 | ORAL_TABLET | Freq: Every day | ORAL | 11 refills | Status: DC

## 2020-09-05 NOTE — Progress Notes (Signed)
Patient ID: Angela Calderon, female   DOB: 2000/10/11, 19 y.o.   MRN: [id number] Ms Lagrand presents with c/o intermittent vaginal odor after IC. She has a new sexual partner. No change in her vaginal discharge. Cycles are monthly, except this month she had some spotting mid cycle. Home UPT negative No contraception H/O SAB x 1  PE AF VSS Lungs clear Heart RRR Abd soft + Bs  A/P Vaginal odor        Contraception management  Suspect vaginal odor related to ph change with new sexual partner. Reviewed with pt. Self swab collected today. Will treat according to results. Discussed contraception may help with this problem plus provide contraception. Pt is agreeable. U/R/B and back up method reviewed with pt. To start Sunday after next cycle. F/U in 3-4 months

## 2020-09-05 NOTE — Progress Notes (Signed)
Pt states she is having issue with vaginal odor, no change in d/c.  Pt would like std screen to r/o any problems.

## 2020-09-09 LAB — CERVICOVAGINAL ANCILLARY ONLY
Bacterial Vaginitis (gardnerella): POSITIVE — AB
Candida Glabrata: NEGATIVE
Candida Vaginitis: NEGATIVE
Chlamydia: NEGATIVE
Comment: NEGATIVE
Comment: NEGATIVE
Comment: NEGATIVE
Comment: NEGATIVE
Comment: NEGATIVE
Comment: NORMAL
Neisseria Gonorrhea: NEGATIVE
Trichomonas: NEGATIVE

## 2020-09-10 ENCOUNTER — Other Ambulatory Visit: Payer: Self-pay | Admitting: *Deleted

## 2020-09-10 MED ORDER — METRONIDAZOLE 500 MG PO TABS: 500.0000 mg | ORAL_TABLET | Freq: Two times a day (BID) | ORAL | 1 refills | Status: DC

## 2020-09-10 NOTE — Progress Notes (Signed)
Rx sent for +BV See lab result

## 2020-09-15 DIAGNOSIS — Z03818 Encounter for observation for suspected exposure to other biological agents ruled out: Secondary | ICD-10-CM | POA: Diagnosis not present

## 2020-09-15 DIAGNOSIS — J309 Allergic rhinitis, unspecified: Secondary | ICD-10-CM | POA: Diagnosis not present

## 2020-09-15 DIAGNOSIS — J069 Acute upper respiratory infection, unspecified: Secondary | ICD-10-CM | POA: Diagnosis not present

## 2020-09-15 DIAGNOSIS — R509 Fever, unspecified: Secondary | ICD-10-CM | POA: Diagnosis not present

## 2020-09-15 DIAGNOSIS — Z20822 Contact with and (suspected) exposure to covid-19: Secondary | ICD-10-CM | POA: Diagnosis not present

## 2020-09-17 DIAGNOSIS — R059 Cough, unspecified: Secondary | ICD-10-CM | POA: Diagnosis not present

## 2020-09-17 DIAGNOSIS — Z03818 Encounter for observation for suspected exposure to other biological agents ruled out: Secondary | ICD-10-CM | POA: Diagnosis not present

## 2020-09-17 DIAGNOSIS — Z20822 Contact with and (suspected) exposure to covid-19: Secondary | ICD-10-CM | POA: Diagnosis not present

## 2020-09-17 DIAGNOSIS — J189 Pneumonia, unspecified organism: Secondary | ICD-10-CM | POA: Diagnosis not present

## 2020-09-17 DIAGNOSIS — J309 Allergic rhinitis, unspecified: Secondary | ICD-10-CM | POA: Diagnosis not present

## 2020-10-18 ENCOUNTER — Encounter (HOSPITAL_COMMUNITY): Payer: Self-pay

## 2020-10-18 ENCOUNTER — Emergency Department (HOSPITAL_COMMUNITY)
Admission: EM | Admit: 2020-10-18 | Discharge: 2020-10-18 | Disposition: A | Discharge status: Home / Self Care | Payer: Medicaid Other | Attending: Emergency Medicine | Admitting: Emergency Medicine

## 2020-10-18 ENCOUNTER — Other Ambulatory Visit: Payer: Self-pay

## 2020-10-18 DIAGNOSIS — L292 Pruritus vulvae: Secondary | ICD-10-CM | POA: Diagnosis not present

## 2020-10-18 DIAGNOSIS — B373 Candidiasis of vulva and vagina: Secondary | ICD-10-CM | POA: Diagnosis not present

## 2020-10-18 DIAGNOSIS — B3731 Acute candidiasis of vulva and vagina: Secondary | ICD-10-CM

## 2020-10-18 DIAGNOSIS — Z7722 Contact with and (suspected) exposure to environmental tobacco smoke (acute) (chronic): Secondary | ICD-10-CM | POA: Insufficient documentation

## 2020-10-18 DIAGNOSIS — R300 Dysuria: Secondary | ICD-10-CM | POA: Diagnosis present

## 2020-10-18 LAB — URINALYSIS, ROUTINE W REFLEX MICROSCOPIC
Bilirubin Urine: NEGATIVE
Glucose, UA: NEGATIVE mg/dL
Hgb urine dipstick: NEGATIVE
Ketones, ur: NEGATIVE mg/dL
Nitrite: NEGATIVE
Protein, ur: NEGATIVE mg/dL
Specific Gravity, Urine: 1.019 (ref 1.005–1.030)
pH: 6 (ref 5.0–8.0)

## 2020-10-18 LAB — WET PREP, GENITAL
Clue Cells Wet Prep HPF POC: NONE SEEN
Sperm: NONE SEEN
Trich, Wet Prep: NONE SEEN

## 2020-10-18 LAB — POC URINE PREG, ED: Preg Test, Ur: NEGATIVE

## 2020-10-18 MED ORDER — FLUCONAZOLE 150 MG PO TABS: 150.0000 mg | ORAL_TABLET | Freq: Every day | ORAL | 0 refills | Status: AC

## 2020-10-18 NOTE — ED Triage Notes (Signed)
Pt states she has hx of recurrent UTIs and has been having burning with urination x1 month. Pt reports malodorous vaginal discharge intermittently during that time. States she was seen at [facility] and prescribed 500 mg Flagyl on June 1 without relief. LMP 6/24

## 2020-10-18 NOTE — Discharge Instructions (Addendum)
Take Diflucan as prescribed and complete the full course. Consider vaginal probiotic suppository. Be sure to drink plenty of water. If you would prefer to see a different provider (than your PCP) or for GYN health needs, please see list below.  Walk-ins for certain complaints available at:   [facility] [address]  [phone]  See hours at [email or web address]   [facility] [employer] [facility] [employer] [facility]  [address]  [phone]   [facility] [employer] [facility] [employer]  [phone]   You can make an appointment to see a GYN provider:   [facility] for [facility] at [facility]  [address]  [phone]   [facility] for [facility] at [facility]  [address] [employer]  [phone]   [facility] for [facility] at [facility]  [phone]   Center for [facility] at [facility]  [address]  [phone]   If you already have an established OB/GYN provider in the area you can make an appointment with them as well.

## 2020-10-18 NOTE — ED Provider Notes (Signed)
[facility] EMERGENCY DEPARTMENT Provider Note   CSN: [id number] Arrival date & time: 10/18/20  1683     History Chief Complaint  Patient presents with   Recurrent UTI    Angela Calderon is a 20 y.o. female.  20 year old female presents with complaint of dysuria, vaginal itching and odor with discharge.  Patient states symptoms have been intermittent since March when she began having intercourse with a new partner.  Patient has been to her gynecologist for her symptoms however reported seeing a female provider was not comfortable having a pelvic exam at that time so a urine sample was sent.  Patient has tested positive for BV as well as yeast on separate visits.  States her symptoms today started about mid June.  Denies abdominal pain, fevers, nausea, vomiting.  No other complaints or concerns today.  LMP 10/03/2020      Past Medical History:  Diagnosis Date   UTI (urinary tract infection)     Patient Active Problem List   Diagnosis Date Noted   Vaginal odor 09/05/2020   Contraception management 09/05/2020    Past Surgical History:  Procedure Laterality Date   TONSILLECTOMY AND ADENOIDECTOMY Bilateral 09/29/2012   Procedure: TONSILLECTOMY AND ADENOIDECTOMY;  Surgeon: Melvenia Beam, MD;  Location: [facility] OR;  Service: ENT;  Laterality: Bilateral;     OB History     Gravida  1   Para  0   Term  0   Preterm  0   AB  0   Living  0      SAB  0   IAB  0   Ectopic  0   Multiple  0   Live Births  0           Family History  Problem Relation Age of Onset   Cervical cancer Mother    Ovarian cancer Maternal Grandmother     Social History   Tobacco Use   Smoking status: Passive Smoke Exposure - Never Smoker   Smokeless tobacco: Never  Vaping Use   Vaping Use: Never used  Substance Use Topics   Alcohol use: No   Drug use: No    Home Medications Prior to Admission medications   Medication Sig Start Date End Date Taking?  Authorizing Provider  fluconazole (DIFLUCAN) 150 MG tablet Take 1 tablet (150 mg total) by mouth daily for 5 days. 10/18/20 10/23/20 Yes Jeannie Fend, PA-C  metroNIDAZOLE (FLAGYL) 500 MG tablet Take 1 tablet (500 mg total) by mouth 2 (two) times daily. 09/10/20   Hermina Staggers, MD  norethindrone-ethinyl estradiol-FE (JUNEL FE 1/20) 1-20 MG-MCG tablet Take 1 tablet by mouth daily. 09/05/20   Hermina Staggers, MD    Allergies    Patient has no known allergies.  Review of Systems   Review of Systems  Constitutional:  Negative for chills and fever.  Respiratory:  Negative for shortness of breath.   Cardiovascular:  Negative for chest pain.  Gastrointestinal:  Negative for abdominal pain, constipation, diarrhea, nausea and vomiting.  Genitourinary:  Positive for dysuria, frequency and vaginal discharge. Negative for pelvic pain.  Skin:  Negative for rash and wound.  Allergic/Immunologic: Negative for immunocompromised state.  Neurological:  Negative for weakness.  Hematological:  Negative for adenopathy.  Psychiatric/Behavioral:  Negative for confusion.   All other systems reviewed and are negative.  Physical Exam Updated Vital Signs BP 108/70 (BP Location: Right Arm)   Pulse 66   Temp 98.4 F (36.9 C) (  Oral)   Resp 17   Ht 5\' 3"  (1.6 m)   Wt 63.5 kg   LMP 10/03/2020 (Exact Date)   SpO2 100%   BMI 24.80 kg/m   Physical Exam Vitals and nursing note reviewed. Exam conducted with a chaperone present.  Constitutional:      General: She is not in acute distress.    Appearance: She is well-developed. She is not diaphoretic.  HENT:     Head: Normocephalic and atraumatic.  Cardiovascular:     Rate and Rhythm: Normal rate and regular rhythm.     Pulses: Normal pulses.     Heart sounds: Normal heart sounds.  Pulmonary:     Effort: Pulmonary effort is normal.     Breath sounds: Normal breath sounds.  Abdominal:     Palpations: Abdomen is soft.     Tenderness: There is no  abdominal tenderness. There is no right CVA tenderness or left CVA tenderness.  Genitourinary:    Vagina: Vaginal discharge, erythema and tenderness present.     Cervix: No cervical motion tenderness or discharge.     Uterus: Normal. Not tender.      Adnexa: Right adnexa normal and left adnexa normal.     Comments: Large amount of thick, white, cheesy discharge present in the vagina with general irritation Musculoskeletal:     Right lower leg: No edema.     Left lower leg: No edema.  Skin:    General: Skin is warm and dry.  Neurological:     Mental Status: She is alert and oriented to person, place, and time.  Psychiatric:        Behavior: Behavior normal.    ED Results / Procedures / Treatments   Labs (all labs ordered are listed, but only abnormal results are displayed) Labs Reviewed  WET PREP, GENITAL - Abnormal; Notable for the following components:      Result Value   Yeast Wet Prep HPF POC PRESENT (*)    WBC, Wet Prep HPF POC MANY (*)    All other components within normal limits  URINALYSIS, ROUTINE W REFLEX MICROSCOPIC - Abnormal; Notable for the following components:   APPearance HAZY (*)    Leukocytes,Ua LARGE (*)    Bacteria, UA RARE (*)    All other components within normal limits  URINE CULTURE  POC URINE PREG, ED  GC/CHLAMYDIA PROBE AMP ([place]) NOT AT [facility]    EKG None  Radiology No results found.  Procedures Pelvic exam  Date/Time: 10/18/2020 9:24 AM Performed by: 12/19/2020, PA-C Authorized by: Jeannie Fend, PA-C  Consent: Verbal consent obtained. Risks and benefits: risks, benefits and alternatives were discussed Consent given by: patient Patient understanding: patient states understanding of the procedure being performed Patient identity confirmed: verbally with patient Local anesthesia used: no  Anesthesia: Local anesthesia used: no  Sedation: Patient sedated: no  Patient tolerance: patient tolerated the procedure well with no  immediate complications     Medications Ordered in ED Medications - No data to display  ED Course  I have reviewed the triage vital signs and the nursing notes.  Pertinent labs & imaging results that were available during my care of the patient were reviewed by me and considered in my medical decision making (see chart for details).  Clinical Course as of 10/18/20 1020  Sat Oct 18, 2020  1229 20 year old female presents with complaint of dysuria, frequency, vaginal discharge and odor as above.  On exam with chaperone present  is found to have a copious amount of thick white cheesy discharge with vaginal irritation. Wet prep is positive for yeast.  Pregnancy test is negative.  Urinalysis with large leukocytes, rare bacteria and is a contaminated sample, suspect secondary to yeast.  We will send a culture due to her complaints of dysuria and frequency. Plan is to treat with Diflucan, she has tried Monistat twice without improvement in her symptoms.  Patient is advised to follow-up in her MyChart account for her gonorrhea and Chlamydia tests although reports having a negative test last month with PCP. Advised to follow-up with PCP for recheck or referred to GYN clinic for recheck if symptoms or not improving.  Discussed use of vaginal probiotic. [LM]    Clinical Course User Index [LM] Alden Hipp   MDM Rules/Calculators/A&P                           Final Clinical Impression(s) / ED Diagnoses Final diagnoses:  Yeast vaginitis    Rx / DC Orders ED Discharge Orders          Ordered    fluconazole (DIFLUCAN) 150 MG tablet  Daily        10/18/20 1005             Alden Hipp 10/18/20 1020    Vanetta Mulders, MD 10/19/20 [phone]

## 2020-10-20 ENCOUNTER — Telehealth: Payer: Self-pay

## 2020-10-20 LAB — URINE CULTURE: Culture: 20000 — AB

## 2020-10-20 LAB — GC/CHLAMYDIA PROBE AMP (~~LOC~~) NOT AT ARMC
Chlamydia: NEGATIVE
Comment: NEGATIVE
Comment: NORMAL
Neisseria Gonorrhea: NEGATIVE

## 2020-10-20 NOTE — Telephone Encounter (Signed)
Transition Care Management Follow-up Telephone Call Date of discharge and from where: 10/18/2020- [facility]  How have you been since you were released from the hospital? Feeling better  Any questions or concerns? No  Items Reviewed: Did the pt receive and understand the discharge instructions provided? Yes  Medications obtained and verified? Yes  Other? No  Any new allergies since your discharge? No  Dietary orders reviewed? N/A Do you have support at home? Yes   Home Care and Equipment/Supplies: Were home health services ordered? not applicable If so, what is the name of the agency? N/A  Has the agency set up a time to come to the patient's home? not applicable Were any new equipment or medical supplies ordered?  No What is the name of the medical supply agency? N/A Were you able to get the supplies/equipment? not applicable Do you have any questions related to the use of the equipment or supplies? No  Functional Questionnaire: (I = Independent and D = Dependent) ADLs: I  Bathing/Dressing- I  Meal Prep- I  Eating- I  Maintaining continence- I  Transferring/Ambulation- I  Managing Meds- I  Follow up appointments reviewed:  PCP Hospital f/u appt confirmed? No  Patient does not have PCP. Stated she will fine one. Specialist Hospital f/u appt confirmed? No   Are transportation arrangements needed? No  If their condition worsens, is the pt aware to call PCP or go to the Emergency Dept.? Yes Was the patient provided with contact information for the PCP's office or ED? Yes Was to pt encouraged to call back with questions or concerns? Yes

## 2020-10-21 ENCOUNTER — Telehealth: Payer: Self-pay | Admitting: *Deleted

## 2020-10-21 NOTE — Telephone Encounter (Signed)
Post ED Visit - Positive Culture Follow-up  Culture report reviewed by antimicrobial stewardship pharmacist: [facility] []  , Pharm.D. []  Enzo Bi, Pharm.D., BCPS AQ-ID []  , Pharm.D., BCPS []  Celedonio Miyamoto, Pharm.D., BCPS []  Raymond, Garvin Fila.D., BCPS, AAHIVP []  , Pharm.D., BCPS, AAHIVP []  Georgina Pillion, PharmD, BCPS []  , PharmD, BCPS []  [place], PharmD, BCPS []  [address], PharmD []  , PharmD, BCPS []  Estella Husk, PharmD  Pharmacy Team []  Lysle Pearl, PharmD []  , PharmD []  Rembert Climes, PharmD []  , Rph []  Agapito Games) , PharmD []  Verlan Friends, PharmD []  , PharmD []  Mervyn Gay, PharmD []  , PharmD []  Vinnie Level, PharmD []  [facility], PharmD []  , PharmD []  Len Childs, PharmD   Positive urine culture, likely yeast. Treated with Fluconazole, organism sensitive to the same and no further patient follow-up is required at this time. , PharmD  Greer Pickerel Talley 10/21/2020, 12:00 PM

## 2020-10-29 ENCOUNTER — Other Ambulatory Visit: Payer: Self-pay

## 2020-10-29 DIAGNOSIS — N390 Urinary tract infection, site not specified: Secondary | ICD-10-CM

## 2020-10-29 MED ORDER — NITROFURANTOIN MONOHYD MACRO 100 MG PO CAPS: 100.0000 mg | ORAL_CAPSULE | Freq: Two times a day (BID) | ORAL | 1 refills | Status: DC

## 2020-10-31 ENCOUNTER — Ambulatory Visit: Payer: Medicaid Other | Admitting: Obstetrics and Gynecology

## 2020-10-31 ENCOUNTER — Other Ambulatory Visit: Payer: Self-pay

## 2020-10-31 ENCOUNTER — Inpatient Hospital Stay (HOSPITAL_COMMUNITY)
Admission: AD | Admit: 2020-10-31 | Discharge: 2020-10-31 | Disposition: A | Discharge status: Home / Self Care | Payer: Medicaid Other | Attending: Obstetrics and Gynecology | Admitting: Obstetrics and Gynecology

## 2020-10-31 DIAGNOSIS — Z3202 Encounter for pregnancy test, result negative: Secondary | ICD-10-CM | POA: Insufficient documentation

## 2020-10-31 LAB — POCT PREGNANCY, URINE: Preg Test, Ur: NEGATIVE

## 2020-10-31 NOTE — MAU Provider Note (Signed)
Patient Angela Calderon is a 20 y.o. G1P0000  Here with complaints of vaginal bleeding. She reports clotting;  her LMP was on 10/03/2020. She reports that her pregnancy test at home was negative but she was  worried because she had a miscarriage last year and it was the "same symptoms".  She reports pain yesterday to, 9/10. Today her pain is a 2/10. The pain and bleeding have now resolved.   She is taking antibiotics for UTI; was seen in [facility] last week and given that RX.   Given no positive pregnancy test at home and negative UPT here in [place], patient is not ruled in for pregnancy of unknown location.  Reviewed that she most likely is having a heavy period, not a miscarriage.   Reviewed signs of pregnancy, heavy bleeding of a period, how and when to take pregnancy test.   Patient is tearful about not getting pregnant; support and reassurance given. Patient can follow up at [facility] as necessary.   Charlesetta Garibaldi Amadou Katzenstein 10/31/2020, 8:33 AM

## 2020-10-31 NOTE — MAU Note (Signed)
Pt reports her period is 2 days early and it has been heavier than usual passing clots nickel and quarter sized. Cramping was stronger as well. Pt stated bleeding today is a lot lighter. Was worried she might be having a miscarriage because when she did last year the bleeding and cramping was the same.

## 2020-11-05 ENCOUNTER — Ambulatory Visit: Payer: Medicaid Other | Admitting: Obstetrics and Gynecology

## 2020-11-10 ENCOUNTER — Ambulatory Visit (INDEPENDENT_AMBULATORY_CARE_PROVIDER_SITE_OTHER): Payer: Medicaid Other | Admitting: Obstetrics and Gynecology

## 2020-11-10 ENCOUNTER — Other Ambulatory Visit (HOSPITAL_COMMUNITY)
Admission: RE | Admit: 2020-11-10 | Discharge: 2020-11-10 | Disposition: A | Discharge status: Home / Self Care | Payer: Medicaid Other | Source: Ambulatory Visit | Attending: Obstetrics and Gynecology | Admitting: Obstetrics and Gynecology

## 2020-11-10 ENCOUNTER — Other Ambulatory Visit: Payer: Self-pay

## 2020-11-10 ENCOUNTER — Encounter: Payer: Self-pay | Admitting: Obstetrics and Gynecology

## 2020-11-10 VITALS — BP 114/77 | HR 77 | Wt 139.0 lb

## 2020-11-10 DIAGNOSIS — Z124 Encounter for screening for malignant neoplasm of cervix: Secondary | ICD-10-CM

## 2020-11-10 DIAGNOSIS — Z3009 Encounter for other general counseling and advice on contraception: Secondary | ICD-10-CM | POA: Diagnosis not present

## 2020-11-10 DIAGNOSIS — N760 Acute vaginitis: Secondary | ICD-10-CM | POA: Diagnosis not present

## 2020-11-10 MED ORDER — LO LOESTRIN FE 1 MG-10 MCG / 10 MCG PO TABS: 1.0000 | ORAL_TABLET | Freq: Every day | ORAL | 11 refills | Status: DC

## 2020-11-10 NOTE — Progress Notes (Signed)
20 yo P0 here as ED follow up on recent UTI and concerns for recurrent yeast infection. Patient reports a monthly period lasting 5-7 days. She is sexually active without contraception. She would welcome a pregnancy if it were to happen but she is interested in contraception. Patient reports the presence of an occasional brown discharge. She denies  pelvic pain  Past Medical History:  Diagnosis Date   UTI (urinary tract infection)    Past Surgical History:  Procedure Laterality Date   TONSILLECTOMY AND ADENOIDECTOMY Bilateral 09/29/2012   Procedure: TONSILLECTOMY AND ADENOIDECTOMY;  Surgeon: Melvenia Beam, MD;  [facility]: [facility];  Service: ENT;  Laterality: Bilateral;   Family History  Problem Relation Age of Onset   Cervical cancer Mother    Ovarian cancer Maternal Grandmother    Social History   Tobacco Use   Smoking status: Passive Smoke Exposure - Never Smoker   Smokeless tobacco: Never  Vaping Use   Vaping Use: Never used  Substance Use Topics   Alcohol use: No   Drug use: No   ROS See pertinent in HPI. All other systems reviewed and non contributory  Blood pressure 114/77, pulse 77, weight 139 lb (63 kg), last menstrual period 10/29/2020, unknown if currently breastfeeding. GENERAL: Well-developed, well-nourished female in no acute distress.  HEENT: Normocephalic, atraumatic. Sclerae anicteric.  NECK: Supple. Normal thyroid.  LUNGS: Clear to auscultation bilaterally.  HEART: Regular rate and rhythm. BREASTS: Symmetric in size. No palpable masses or lymphadenopathy, skin changes, or nipple drainage. ABDOMEN: Soft, nontender, nondistended. No organomegaly. PELVIC: Normal external female genitalia. Vagina is pink and rugated.  Normal discharge. Normal appearing cervix. Uterus is normal in size.  No adnexal mass or tenderness. EXTREMITIES: No cyanosis, clubbing, or edema, 2+ distal pulses.  A/P 20 yo with vaginitis - vaginal swab collected - Patient declined STI testing-  Patient with negative GC/Cl on 10/18/20 - Pap smear collected - Rx Lo Loestrin provided - patient will be contacted with abnormal results - RTC prn

## 2020-11-10 NOTE — Progress Notes (Signed)
RGYN patient presents for F/U on UTI and yeast infection.  LMP:10/29/20   CC: Over the weekend noticed vaginal discharge at first was brownish, then later had a yeast like white discharge no vaginal itching Pt also noticed sharp pelvic pain.  *Pt wants swab declines STD screening.   UPT : neg pt requested .

## 2020-11-12 LAB — URINE CULTURE

## 2020-11-12 LAB — CYTOLOGY - PAP: Diagnosis: NEGATIVE

## 2020-11-12 LAB — CERVICOVAGINAL ANCILLARY ONLY
Bacterial Vaginitis (gardnerella): NEGATIVE
Candida Glabrata: NEGATIVE
Candida Vaginitis: NEGATIVE
Comment: NEGATIVE
Comment: NEGATIVE
Comment: NEGATIVE

## 2020-11-24 ENCOUNTER — Inpatient Hospital Stay (HOSPITAL_COMMUNITY)
Admission: AD | Admit: 2020-11-24 | Discharge: 2020-11-24 | Disposition: A | Discharge status: Home / Self Care | Payer: Medicaid Other | Attending: Obstetrics & Gynecology | Admitting: Obstetrics & Gynecology

## 2020-11-24 DIAGNOSIS — R109 Unspecified abdominal pain: Secondary | ICD-10-CM | POA: Diagnosis not present

## 2020-11-24 DIAGNOSIS — Z3201 Encounter for pregnancy test, result positive: Secondary | ICD-10-CM | POA: Diagnosis present

## 2020-11-24 LAB — HCG, QUANTITATIVE, PREGNANCY: hCG, Beta Chain, Quant, S: 29 m[IU]/mL — ABNORMAL HIGH (ref <5)

## 2020-11-24 LAB — POCT PREGNANCY, URINE: Preg Test, Ur: POSITIVE — AB

## 2020-11-24 NOTE — MAU Provider Note (Signed)
S Angela Calderon is a 20 y.o. G1P0000 possibly pregnant female who presents to MAU today with complaint of positive home pregnancy tests and very mild diffuse cramping this morning, none now, also denies vaginal bleeding. Period is not due until 11/29/20 but she has breast tenderness and nausea so took 3 HPTs. Expresses concern for safety of the pregnancy given hx of miscarriage and current BV (being treated). Also requests PNV prescription if she is pregnant.   Receives care at [facility], records reviewed.  Pertinent items noted in HPI and remainder of comprehensive ROS otherwise negative.  O BP (!) 134/92   Pulse 91   Ht 5\' 3"  (1.6 m)   LMP 10/29/2020 (Exact Date)   BMI 24.62 kg/m  Physical Exam Vitals and nursing note reviewed.  Constitutional:      General: She is not in acute distress.    Appearance: She is well-developed and normal weight. She is not ill-appearing.  HENT:     Head: Normocephalic.  Eyes:     Pupils: Pupils are equal, round, and reactive to light.  Cardiovascular:     Rate and Rhythm: Normal rate and regular rhythm.     Pulses: Normal pulses.  Pulmonary:     Effort: Pulmonary effort is normal.  Abdominal:     General: There is no distension.     Palpations: Abdomen is soft.     Tenderness: There is no abdominal tenderness.  Musculoskeletal:        General: Normal range of motion.  Skin:    General: Skin is warm.     Capillary Refill: Capillary refill takes less than 2 seconds.  Neurological:     Mental Status: She is alert and oriented to person, place, and time.  Psychiatric:        Mood and Affect: Mood normal.        Behavior: Behavior normal.        Thought Content: Thought content normal.        Judgment: Judgment normal.   Results for orders placed or performed during the hospital encounter of 11/24/20 (from the past 24 hour(s))  Pregnancy, urine POC     Status: Abnormal   Collection Time: 11/24/20  8:53 PM  Result Value Ref Range    Preg Test, Ur POSITIVE (A) NEGATIVE  hCG, quantitative, pregnancy     Status: Abnormal   Collection Time: 11/24/20  9:10 PM  Result Value Ref Range   hCG, Beta Chain, Quant, S 29 (H) <5 mIU/mL  Pregnancy test potentially positive (two done, first negative, second VERY faint).   Quantitative HCG ordered, but since pt has no pain she was sent home, will call with HCG results and prescribe PNV if needed. Encouraged her to keep taking flagyl (with food) and reassured her that it is safe in pregnancy and recommended to treat BV to prevent cramping, etc. Pt expressed understanding and relief.  Pt informed of results via MyChart message. PNV sent to pharmacy, pregnancy verification letter uploaded to MyChart. Pregnancy episode started. Advised pt to follow up in MAU if cramping or bleeding occurs.  A Pregnant, less than 8 weeks Medical screening exam complete  P Discharge from MAU in stable condition with return precautions Will begin care at [facility]  Warning signs for worsening condition that would warrant emergency follow-up discussed Patient may return to MAU as needed for emergent OB/GYN related complaints  11/26/20, CNM 11/25/2020 2:43 AM

## 2020-11-24 NOTE — MAU Note (Signed)
Had positive HPT.  Started  having some cramping this morning but none now. . Denies any vag bleeding or discharge. Is being treated for BV and is concerned if she should continue antibiotic or not.

## 2020-11-25 ENCOUNTER — Other Ambulatory Visit: Payer: Self-pay | Admitting: Certified Nurse Midwife

## 2020-11-25 ENCOUNTER — Encounter: Payer: Self-pay | Admitting: Certified Nurse Midwife

## 2020-11-25 DIAGNOSIS — Z3401 Encounter for supervision of normal first pregnancy, first trimester: Secondary | ICD-10-CM | POA: Insufficient documentation

## 2020-11-25 DIAGNOSIS — Z3A01 Less than 8 weeks gestation of pregnancy: Secondary | ICD-10-CM

## 2020-11-25 DIAGNOSIS — Z8759 Personal history of other complications of pregnancy, childbirth and the puerperium: Secondary | ICD-10-CM

## 2020-11-25 HISTORY — DX: Personal history of other complications of pregnancy, childbirth and the puerperium: Z87.59

## 2020-11-25 MED ORDER — PRENATAL PLUS 27-1 MG PO TABS: 1.0000 | ORAL_TABLET | Freq: Every day | ORAL | 9 refills | Status: DC

## 2020-12-09 DIAGNOSIS — R112 Nausea with vomiting, unspecified: Secondary | ICD-10-CM | POA: Diagnosis not present

## 2020-12-12 ENCOUNTER — Encounter (HOSPITAL_COMMUNITY): Payer: Self-pay | Admitting: Obstetrics & Gynecology

## 2020-12-12 ENCOUNTER — Other Ambulatory Visit: Payer: Self-pay

## 2020-12-12 ENCOUNTER — Inpatient Hospital Stay (HOSPITAL_COMMUNITY)
Admission: AD | Admit: 2020-12-12 | Discharge: 2020-12-12 | Disposition: A | Discharge status: Home / Self Care | Payer: Medicaid Other | Attending: Obstetrics & Gynecology | Admitting: Obstetrics & Gynecology

## 2020-12-12 ENCOUNTER — Telehealth: Payer: Self-pay | Admitting: *Deleted

## 2020-12-12 ENCOUNTER — Inpatient Hospital Stay (HOSPITAL_COMMUNITY): Payer: Medicaid Other

## 2020-12-12 DIAGNOSIS — O209 Hemorrhage in early pregnancy, unspecified: Secondary | ICD-10-CM | POA: Insufficient documentation

## 2020-12-12 DIAGNOSIS — R102 Pelvic and perineal pain: Secondary | ICD-10-CM | POA: Insufficient documentation

## 2020-12-12 DIAGNOSIS — Z6791 Unspecified blood type, Rh negative: Secondary | ICD-10-CM

## 2020-12-12 DIAGNOSIS — O26891 Other specified pregnancy related conditions, first trimester: Secondary | ICD-10-CM | POA: Insufficient documentation

## 2020-12-12 DIAGNOSIS — Z3A01 Less than 8 weeks gestation of pregnancy: Secondary | ICD-10-CM | POA: Insufficient documentation

## 2020-12-12 DIAGNOSIS — R109 Unspecified abdominal pain: Secondary | ICD-10-CM

## 2020-12-12 DIAGNOSIS — O26899 Other specified pregnancy related conditions, unspecified trimester: Secondary | ICD-10-CM

## 2020-12-12 DIAGNOSIS — Z3490 Encounter for supervision of normal pregnancy, unspecified, unspecified trimester: Secondary | ICD-10-CM

## 2020-12-12 LAB — CBC
HCT: 38.4 % (ref 36.0–46.0)
Hemoglobin: 13.1 g/dL (ref 12.0–15.0)
MCH: 30.6 pg (ref 26.0–34.0)
MCHC: 34.1 g/dL (ref 30.0–36.0)
MCV: 89.7 fL (ref 80.0–100.0)
Platelets: 288 10*3/uL (ref 150–400)
RBC: 4.28 MIL/uL (ref 3.87–5.11)
RDW: 12.5 % (ref 11.5–15.5)
WBC: 12 10*3/uL — ABNORMAL HIGH (ref 4.0–10.5)
nRBC: 0 % (ref 0.0–0.2)

## 2020-12-12 LAB — COMPREHENSIVE METABOLIC PANEL
ALT: 16 U/L (ref 0–44)
AST: 19 U/L (ref 15–41)
Albumin: 3.8 g/dL (ref 3.5–5.0)
Alkaline Phosphatase: 54 U/L (ref 38–126)
Anion gap: 6 (ref 5–15)
BUN: 8 mg/dL (ref 6–20)
CO2: 24 mmol/L (ref 22–32)
Calcium: 9.3 mg/dL (ref 8.9–10.3)
Chloride: 107 mmol/L (ref 98–111)
Creatinine, Ser: 0.75 mg/dL (ref 0.44–1.00)
GFR, Estimated: >60 mL/min (ref >60)
Glucose, Bld: 89 mg/dL (ref 70–99)
Potassium: 4 mmol/L (ref 3.5–5.1)
Sodium: 137 mmol/L (ref 135–145)
Total Bilirubin: 0.9 mg/dL (ref 0.3–1.2)
Total Protein: 6.4 g/dL — ABNORMAL LOW (ref 6.5–8.1)

## 2020-12-12 LAB — URINALYSIS, ROUTINE W REFLEX MICROSCOPIC
Bilirubin Urine: NEGATIVE
Glucose, UA: NEGATIVE mg/dL
Hgb urine dipstick: NEGATIVE
Ketones, ur: NEGATIVE mg/dL
Leukocytes,Ua: NEGATIVE
Nitrite: NEGATIVE
Protein, ur: NEGATIVE mg/dL
Specific Gravity, Urine: 1.019 (ref 1.005–1.030)
pH: 5 (ref 5.0–8.0)

## 2020-12-12 LAB — HCG, QUANTITATIVE, PREGNANCY: hCG, Beta Chain, Quant, S: 56433 m[IU]/mL — ABNORMAL HIGH (ref <5)

## 2020-12-12 LAB — WET PREP, GENITAL
Sperm: NONE SEEN
Trich, Wet Prep: NONE SEEN
Yeast Wet Prep HPF POC: NONE SEEN

## 2020-12-12 LAB — POCT PREGNANCY, URINE: Preg Test, Ur: POSITIVE — AB

## 2020-12-12 MED ORDER — RHO D IMMUNE GLOBULIN 1500 UNIT/2ML IJ SOSY
300.0000 ug | PREFILLED_SYRINGE | Freq: Once | INTRAMUSCULAR | Status: AC
  Administered 2020-12-12: 300 ug via INTRAMUSCULAR
  Filled 2020-12-12: qty 2

## 2020-12-12 MED ORDER — TERCONAZOLE 0.4 % VA CREA: 1.0000 | TOPICAL_CREAM | Freq: Every day | VAGINAL | 0 refills | Status: DC

## 2020-12-12 NOTE — Telephone Encounter (Signed)
Returned TC to patient regarding report of pink discharge with 6.[redacted] wk GA pregnancy. Patient's concern was previously addressed in returned MyChart message following consultation with Dr. Alysia Penna. Per Dr. Alysia Penna, RN is to follow protocol and advise that the patient is seen in the office or in MAU. Patient instructed to go to MAU do to office closing at 12N.

## 2020-12-12 NOTE — MAU Provider Note (Addendum)
Patient Angela Calderon is a  20 y.o. G2P0010  At [redacted]w[redacted]d here with complaints of vaginal bleeding and abdominal pain that started last night. She reports some nausea and vomiting but denies diarrhea, constipation, fever, SOB.  She receives her care at [facility]. She denies any other complaints. SHe called her provider today and they told her to come into MAU based on the fact that she was early pregnant with abdominal pain and spotting.  History     CSN: [id number]  Arrival date and time: 12/12/20 1628   Event Date/Time   First Provider Initiated Contact with Patient 12/12/20 1723      Chief Complaint  Patient presents with   Abdominal Pain   Vaginal Bleeding   Abdominal Pain This is a new problem. The current episode started yesterday. The problem has been resolved since onset. The pain is located in the suprapubic region. The pain is at a severity of 4/10. Associated symptoms include nausea. Pertinent negatives include no constipation, diarrhea, dysuria or fever. Nothing relieves the symptoms.  Vaginal Bleeding The patient's primary symptoms include vaginal bleeding. This is a new problem. The current episode started yesterday. The problem has been resolved. Associated symptoms include abdominal pain and nausea. Pertinent negatives include no constipation, diarrhea, dysuria or fever. The vaginal discharge was thin. The vaginal bleeding is spotting. She has not been passing clots. She has not been passing tissue.   OB History     Gravida  2   Para  0   Term  0   Preterm  0   AB  1   Living  0      SAB  1   IAB  0   Ectopic  0   Multiple  0   Live Births  0           Past Medical History:  Diagnosis Date   Contraception management 09/05/2020   UTI (urinary tract infection)     Past Surgical History:  Procedure Laterality Date   TONSILLECTOMY AND ADENOIDECTOMY Bilateral 09/29/2012   Procedure: TONSILLECTOMY AND ADENOIDECTOMY;  Surgeon: Melvenia Beam, MD;   Location: [facility] OR;  Service: ENT;  Laterality: Bilateral;    Family History  Problem Relation Age of Onset   Cervical cancer Mother    Ovarian cancer Maternal Grandmother     Social History   Tobacco Use   Smoking status: Never    Passive exposure: Yes   Smokeless tobacco: Never  Vaping Use   Vaping Use: Never used  Substance Use Topics   Alcohol use: No   Drug use: No    Allergies: No Known Allergies  Medications Prior to Admission  Medication Sig Dispense Refill Last Dose   metroNIDAZOLE (FLAGYL) 500 MG tablet Take 1 tablet (500 mg total) by mouth 2 (two) times daily. (Patient not taking: Reported on 11/10/2020) 14 tablet 1    prenatal vitamin w/FE, FA (PRENATAL 1 + 1) 27-1 MG TABS tablet Take 1 tablet by mouth daily at 12 noon. 30 tablet 9     Review of Systems  Constitutional:  Negative for fever.  Respiratory: Negative.    Cardiovascular: Negative.   Gastrointestinal:  Positive for abdominal pain and nausea. Negative for constipation and diarrhea.  Genitourinary:  Positive for vaginal bleeding. Negative for dysuria.  Neurological: Negative.   Physical Exam   Blood pressure 135/81, pulse 93, temperature 99.3 F (37.4 C), temperature source Oral, resp. rate 16, last menstrual period 10/29/2020, SpO2 99 %, unknown if  currently breastfeeding.  Physical Exam Constitutional:      Appearance: She is well-developed.  Abdominal:     General: Abdomen is flat.     Palpations: Abdomen is soft.     Tenderness: There is no abdominal tenderness.  Genitourinary:    Vagina: Normal.     Cervix: Normal.     Adnexa: Right adnexa normal and left adnexa normal.  Neurological:     Mental Status: She is alert.  NEFG; white discharge in the vagina consistent with yeast, but no blood. Cervix is long, closed, posterior, no CMT, no suprapubic tenderness.   MAU Course  Procedures  MDM -patient had full ectopic work-up done due to complaint of early pregnant and abdominal pain with  spotting - I have independently reviewed the [place] images, which reveal finding of SIUP with cardiac activity. -Patient blood type O neg; she received Rhogam in MAU -bHCG is above 56, 000 -patient had uneventful MAU course Assessment and Plan   1. Intrauterine pregnancy   2. Abdominal pain   -reviewed first trimester precautions, patient and partner verbalized understanding -RX for terazole given -patient stable for discharge with plan to start prenatal care; she has appt set up in two weeks at [facility] 12/12/2020, 6:12 PM

## 2020-12-12 NOTE — MAU Note (Signed)
Angela Calderon is a 20 y.o. at [redacted]w[redacted]d here in MAU reporting: intermittent vaginal bleeding and abdominal pain since yesterday. States bleeding started as pink yesterday and is darker today, bleeding is only when she wipes. Is also concerned for yeast infection because she is having some itching and shedding of some skin.   Onset of complaint: yesterday  Pain score: 4/10  Vitals:   12/12/20 1705  BP: 135/81  Pulse: 93  Resp: 16  Temp: 99.3 F (37.4 C)  SpO2: 99%     Lab orders placed from triage: upt, ua

## 2020-12-13 DIAGNOSIS — Z6791 Unspecified blood type, Rh negative: Secondary | ICD-10-CM

## 2020-12-13 DIAGNOSIS — O26899 Other specified pregnancy related conditions, unspecified trimester: Secondary | ICD-10-CM

## 2020-12-13 LAB — RH IG WORKUP (INCLUDES ABO/RH)
ABO/RH(D): O NEG
Antibody Screen: NEGATIVE
Gestational Age(Wks): 6.2
Unit division: 0

## 2020-12-16 LAB — GC/CHLAMYDIA PROBE AMP (~~LOC~~) NOT AT ARMC
Chlamydia: NEGATIVE
Comment: NEGATIVE
Comment: NORMAL
Neisseria Gonorrhea: NEGATIVE

## 2020-12-17 ENCOUNTER — Other Ambulatory Visit: Payer: Self-pay | Admitting: *Deleted

## 2020-12-17 MED ORDER — DICLEGIS 10-10 MG PO TBEC: DELAYED_RELEASE_TABLET | ORAL | 3 refills | Status: DC

## 2020-12-17 NOTE — Progress Notes (Signed)
Pt states need for N&V Rx.  Pt states she has been taking Promethazine but cannot function due to drowsiness. Diclegis sent today.

## 2020-12-25 ENCOUNTER — Ambulatory Visit (INDEPENDENT_AMBULATORY_CARE_PROVIDER_SITE_OTHER): Payer: Medicaid Other

## 2020-12-25 DIAGNOSIS — Z3401 Encounter for supervision of normal first pregnancy, first trimester: Secondary | ICD-10-CM

## 2020-12-25 MED ORDER — BLOOD PRESSURE MONITOR DEVI: 0 refills | Status: DC

## 2020-12-25 NOTE — Progress Notes (Signed)
New OB Intake  I connected with  Angela Calderon on 12/25/20 at 10:00 AM EDT by telephone Video Visit and verified that I am speaking with the correct person using two identifiers. Nurse is located at [facility] and pt is located at office.  I discussed the limitations, risks, security and privacy concerns of performing an evaluation and management service by telephone and the availability of in person appointments. I also discussed with the patient that there may be a patient responsible charge related to this service. The patient expressed understanding and agreed to proceed.  I explained I am completing New OB Intake today. We discussed her EDD of 08/05/2021 that is based on LMP of 10/31/2020. Pt is G2/P0. I reviewed her allergies, medications, Medical/Surgical/OB history, and appropriate screenings. I informed her of [facility] services. Based on history, this is a/an  pregnancy uncomplicated .   Patient Active Problem List   Diagnosis Date Noted   Rh negative state in antepartum period 12/13/2020   Supervision of low-risk first pregnancy, first trimester 11/25/2020   History of miscarriage 11/25/2020    Concerns addressed today  Delivery Plans:  Plans to deliver at [facility].   MyChart/Babyscripts MyChart access verified. I explained pt will have some visits in office and some virtually. Babyscripts instructions given and order placed. Patient verifies receipt of registration text/e-mail. Account successfully created and app downloaded.  Blood Pressure Cuff  Blood pressure cuff ordered for patient to pick-up from [employer]. Explained after first prenatal appt pt will check weekly and document in Babyscripts.  Weight scale: Patient    have weight scale. Weight scale ordered for patient to pick up form [facility].   Anatomy US Explained first scheduled [place] will be around 19 weeks. Anatomy US scheduled for MFM at next in-person appointment. Pt notified to arrive at  01/08/2021.  Labs Discussed Avelina Laine genetic screening with patient. Would like both Panorama and Horizon drawn at new OB visit. Routine prenatal labs needed.  Covid Vaccine Patient has not covid vaccine.   Mother/ Baby Dyad Candidate?    If yes, offer as possibility  Informed patient of [facility] website  and placed link in her AVS.   Social Determinants of Health Food Insecurity: Patient expresses food insecurity. Food Market information given to patient; explained patient may visit at the end of first OB appointment. WIC Referral: Patient is interested in referral to [facility].  Transportation: Patient denies transportation needs. Childcare: Discussed no children allowed at ultrasound appointments. Offered childcare services; Patient would like link to Pregnancy Navigators   Placed OB Box on problem list and updated  First visit review I reviewed new OB appt with pt. I explained she will have a pelvic exam, ob bloodwork with genetic screening, and PAP smear. Explained pt will be seen by provider at first visit; encounter routed to appropriate provider. Explained that patient will be seen by pregnancy navigator following visit with provider. [facility] information placed in AVS.   Amaree Loisel A Cheree Ditto, CMA 12/25/2020  10:43 AM

## 2020-12-31 ENCOUNTER — Other Ambulatory Visit: Payer: Self-pay

## 2020-12-31 ENCOUNTER — Ambulatory Visit (INDEPENDENT_AMBULATORY_CARE_PROVIDER_SITE_OTHER): Payer: Medicaid Other

## 2020-12-31 ENCOUNTER — Other Ambulatory Visit (HOSPITAL_COMMUNITY)
Admission: RE | Admit: 2020-12-31 | Discharge: 2020-12-31 | Disposition: A | Discharge status: Home / Self Care | Payer: Medicaid Other | Source: Ambulatory Visit | Attending: Obstetrics and Gynecology | Admitting: Obstetrics and Gynecology

## 2020-12-31 DIAGNOSIS — N898 Other specified noninflammatory disorders of vagina: Secondary | ICD-10-CM | POA: Insufficient documentation

## 2020-12-31 DIAGNOSIS — R300 Dysuria: Secondary | ICD-10-CM | POA: Diagnosis not present

## 2020-12-31 LAB — POCT URINALYSIS DIPSTICK
Bilirubin, UA: NEGATIVE
Glucose, UA: NEGATIVE
Ketones, UA: NEGATIVE
Nitrite, UA: NEGATIVE
Odor: NEGATIVE
Protein, UA: NEGATIVE
Spec Grav, UA: 1.01 (ref 1.010–1.025)
Urobilinogen, UA: 0.2 E.U./dL
pH, UA: 6 (ref 5.0–8.0)

## 2020-12-31 NOTE — Progress Notes (Signed)
SUBJECTIVE:  20 y.o. female complains of malodorous vaginal discharge, dysuria x 1 week and tinge of pink spotting after wiping. Denies abnormal vaginal bleeding or significant pelvic pain or fever. Pt also c/o dysuria. Denies history of known exposure to STD.  Patient's last menstrual period was 10/29/2020 (exact date).  OBJECTIVE:  She appears well, afebrile. Urine dipstick: positive for leukocytes, small trace blood  ASSESSMENT:  Vaginal Discharge  Vaginal Odor   PLAN:  GC, chlamydia, trichomonas, BVAG, CVAG probe sent to lab. Treatment: To be determined once lab results are reviewed by the provider ROV prn if symptoms persist or worsen.

## 2020-12-31 NOTE — Progress Notes (Signed)
Agree with A & P. 

## 2021-01-01 LAB — CERVICOVAGINAL ANCILLARY ONLY
Bacterial Vaginitis (gardnerella): POSITIVE — AB
Candida Glabrata: NEGATIVE
Candida Vaginitis: NEGATIVE
Chlamydia: NEGATIVE
Comment: NEGATIVE
Comment: NEGATIVE
Comment: NEGATIVE
Comment: NEGATIVE
Comment: NEGATIVE
Comment: NORMAL
Neisseria Gonorrhea: NEGATIVE
Trichomonas: NEGATIVE

## 2021-01-02 ENCOUNTER — Other Ambulatory Visit: Payer: Self-pay

## 2021-01-02 DIAGNOSIS — B9689 Other specified bacterial agents as the cause of diseases classified elsewhere: Secondary | ICD-10-CM

## 2021-01-02 DIAGNOSIS — N760 Acute vaginitis: Secondary | ICD-10-CM

## 2021-01-02 MED ORDER — METRONIDAZOLE 500 MG PO TABS: 500.0000 mg | ORAL_TABLET | Freq: Two times a day (BID) | ORAL | 0 refills | Status: DC

## 2021-01-04 LAB — URINE CULTURE, OB REFLEX

## 2021-01-04 LAB — CULTURE, OB URINE

## 2021-01-05 ENCOUNTER — Encounter: Payer: Self-pay | Admitting: *Deleted

## 2021-01-05 ENCOUNTER — Other Ambulatory Visit: Payer: Self-pay | Admitting: *Deleted

## 2021-01-05 DIAGNOSIS — O2341 Unspecified infection of urinary tract in pregnancy, first trimester: Secondary | ICD-10-CM

## 2021-01-05 MED ORDER — NITROFURANTOIN MONOHYD MACRO 100 MG PO CAPS: 100.0000 mg | ORAL_CAPSULE | Freq: Two times a day (BID) | ORAL | 0 refills | Status: DC

## 2021-01-05 NOTE — Progress Notes (Signed)
RX sent for [employer]. Patient is active in MyChart. Message sent regarding RX. Patient education on UTI in pregnancy included.

## 2021-01-08 ENCOUNTER — Other Ambulatory Visit: Payer: Self-pay

## 2021-01-08 ENCOUNTER — Ambulatory Visit (INDEPENDENT_AMBULATORY_CARE_PROVIDER_SITE_OTHER): Payer: Medicaid Other | Admitting: Obstetrics and Gynecology

## 2021-01-08 ENCOUNTER — Encounter: Payer: Self-pay | Admitting: Obstetrics and Gynecology

## 2021-01-08 VITALS — BP 133/81 | HR 112 | Wt 141.0 lb

## 2021-01-08 DIAGNOSIS — Z31430 Encounter of female for testing for genetic disease carrier status for procreative management: Secondary | ICD-10-CM | POA: Diagnosis not present

## 2021-01-08 DIAGNOSIS — Z6791 Unspecified blood type, Rh negative: Secondary | ICD-10-CM

## 2021-01-08 DIAGNOSIS — Z3481 Encounter for supervision of other normal pregnancy, first trimester: Secondary | ICD-10-CM | POA: Diagnosis not present

## 2021-01-08 DIAGNOSIS — O26899 Other specified pregnancy related conditions, unspecified trimester: Secondary | ICD-10-CM

## 2021-01-08 DIAGNOSIS — Z3A10 10 weeks gestation of pregnancy: Secondary | ICD-10-CM | POA: Diagnosis not present

## 2021-01-08 DIAGNOSIS — Z3401 Encounter for supervision of normal first pregnancy, first trimester: Secondary | ICD-10-CM | POA: Diagnosis not present

## 2021-01-08 NOTE — Progress Notes (Signed)
Pt request refills on phenergan.

## 2021-01-08 NOTE — Patient Instructions (Addendum)
Considering Waterbirth? Guide for patients at [facility]) Why consider waterbirth? Gentle birth for babies  Less pain medicine used in labor  May allow for passive descent/less pushing  May reduce perineal tears  More mobility and instinctive maternal position changes  Increased maternal relaxation   Is waterbirth safe? What are the risks of infection, drowning or other complications? Infection:  Very low risk (3.7 % for tub vs 4.8% for bed)  7 in 8000 waterbirths with documented infection  Poorly cleaned equipment most common cause  Slightly lower group B strep transmission rate  Drowning  Maternal:  Very low risk  Related to seizures or fainting  Newborn:  Very low risk. No evidence of increased risk of respiratory problems in multiple large studies  Physiological protection from breathing under water  Avoid underwater birth if there are any fetal complications  Once baby's head is out of the water, keep it out.  Birth complication  Some reports of cord trauma, but risk decreased by bringing baby to surface gradually  No evidence of increased risk of shoulder dystocia. Mothers can usually change positions faster in water than in a bed, possibly aiding the maneuvers to free the shoulder.   There are 2 things you MUST do to have a waterbirth with [facility]: Attend a waterbirth class at [facility] at [facility]   3rd Wednesday of every month from 7-9 pm (virtual during COVID) [employer] at [email or web address] or [email or web address] or by calling [phone] Bring [place] the certificate from the class to your prenatal appointment or send via MyChart Meet with a midwife at 36 weeks* to see if you can still plan a waterbirth and to sign the consent.   *We also recommend that you schedule as many of your prenatal visits with a midwife as possible.    Helpful information: You may want to bring a bathing suit top to the hospital  to wear during labor but this is optional.  All other supplies are provided by the hospital. Please arrive at the hospital with signs of active labor, and do not wait at home until late in labor. It takes 45 min- 2 hours for COVID testing, fetal monitoring, and check in to your room to take place, plus transport and filling of the waterbirth tub.    Things that would prevent you from having a waterbirth: Unknown or Positive COVID-19 diagnosis upon admission to hospital* Premature, <37wks  Previous cesarean birth  Presence of thick meconium-stained fluid  Multiple gestation (Twins, triplets, etc.)  Uncontrolled diabetes or gestational diabetes requiring medication  Hypertension diagnosed in pregnancy or preexisting hypertension (gestational hypertension, preeclampsia, or chronic hypertension) Fetal growth restriction (your baby measures less than 10th percentile on ultrasound) Heavy vaginal bleeding  Non-reassuring fetal heart rate  Active infection (MRSA, etc.). Group B Strep is NOT a contraindication for waterbirth.  If your labor has to be induced and induction method requires continuous monitoring of the baby's heart rate  Other risks/issues identified by your obstetrical provider   Please remember that birth is unpredictable. Under certain unforeseeable circumstances your provider may advise against giving birth in the tub. These decisions will be made on a case-by-case basis and with the safety of you and your baby as our highest priority.   *Please remember that in order to have a waterbirth, you must test Negative to COVID-19 upon admission to the hospital.  Updated 07/21/20   Second Trimester of Pregnancy The second trimester of pregnancy is  from week 13 through week 27. This is months 4 through 6 of pregnancy. The second trimester is often a time when you feel your best. Your body has adjusted to being pregnant, and you begin to feel better physically. During the second  trimester: Morning sickness has lessened or stopped completely. You may have more energy. You may have an increase in appetite. The second trimester is also a time when the unborn baby (fetus) is growing rapidly. At the end of the sixth month, the fetus may be up to 12 inches long and weigh about 1 pounds. You will likely begin to feel the baby move (quickening) between 16 and 20 weeks of pregnancy. Body changes during your second trimester Your body continues to go through many changes during your second trimester. The changes vary and generally return to normal after the baby is born. Physical changes Your weight will continue to increase. You will notice your lower abdomen bulging out. You may begin to get stretch marks on your hips, abdomen, and breasts. Your breasts will continue to grow and to become tender. Dark spots or blotches (chloasma or mask of pregnancy) may develop on your face. A dark line from your belly button to the pubic area (linea nigra) may appear. You may have changes in your hair. These can include thickening of your hair, rapid growth, and changes in texture. Some people also have hair loss during or after pregnancy, or hair that feels dry or thin. Health changes You may develop headaches. You may have heartburn. You may develop constipation. You may develop hemorrhoids or swollen, bulging veins (varicose veins). Your gums may bleed and may be sensitive to brushing and flossing. You may urinate more often because the fetus is pressing on your bladder. You may have back pain. This is caused by: Weight gain. Pregnancy hormones that are relaxing the joints in your pelvis. A shift in weight and the muscles that support your balance. Follow these instructions at home: Medicines Follow your health care provider's instructions regarding medicine use. Specific medicines may be either safe or unsafe to take during pregnancy. Do not take any medicines unless approved by  your health care provider. Take a prenatal vitamin that contains at least 600 micrograms (mcg) of folic acid. Eating and drinking Eat a healthy diet that includes fresh fruits and vegetables, whole grains, good sources of protein such as meat, eggs, or tofu, and low-fat dairy products. Avoid raw meat and unpasteurized juice, milk, and cheese. These carry germs that can harm you and your baby. You may need to take these actions to prevent or treat constipation: Drink enough fluid to keep your urine pale yellow. Eat foods that are high in fiber, such as beans, whole grains, and fresh fruits and vegetables. Limit foods that are high in fat and processed sugars, such as fried or sweet foods. Activity Exercise only as directed by your health care provider. Most people can continue their usual exercise routine during pregnancy. Try to exercise for 30 minutes at least 5 days a week. Stop exercising if you develop contractions in your uterus. Stop exercising if you develop pain or cramping in the lower abdomen or lower back. Avoid exercising if it is very hot or humid or if you are at a high altitude. Avoid heavy lifting. If you choose to, you may have sex unless your health care provider tells you not to. Relieving pain and discomfort Wear a supportive bra to prevent discomfort from breast tenderness. Take warm sitz  sitz baths to soothe any pain or discomfort caused by hemorrhoids. Use hemorrhoid cream if your health care provider approves. Rest with your legs raised (elevated) if you have leg cramps or low back pain. If you develop varicose veins: Wear support hose as told by your health care provider. Elevate your feet for 15 minutes, 3-4 times a day. Limit salt in your diet. Safety Wear your seat belt at all times when driving or riding in a car. Talk with your health care provider if someone is verbally or physically abusive to you. Lifestyle Do not use hot tubs, steam rooms, or saunas. Do not  douche. Do not use tampons or scented sanitary pads. Avoid cat litter boxes and soil used by cats. These carry germs that can cause birth defects in the baby and possibly loss of the fetus by miscarriage or stillbirth. Do not use herbal remedies, alcohol, illegal drugs, or medicines that are not approved by your health care provider. Chemicals in these products can harm your baby. Do not use any products that contain nicotine or tobacco, such as cigarettes, e-cigarettes, and chewing tobacco. If you need help quitting, ask your health care provider. General instructions During a routine prenatal visit, your health care provider will do a physical exam and other tests. He or she will also discuss your overall health. Keep all follow-up visits. This is important. Ask your health care provider for a referral to a local prenatal education class. Ask for help if you have counseling or nutritional needs during pregnancy. Your health care provider can offer advice or refer you to specialists for help with various needs. Where to find more information American Pregnancy Association: americanpregnancy.org American College of Obstetricians and Gynecologists: acog.org/en/Womens%20Health/Pregnancy Office on Women's Health: womenshealth.gov/pregnancy Contact a health care provider if you have: A headache that does not go away when you take medicine. Vision changes or you see spots in front of your eyes. Mild pelvic cramps, pelvic pressure, or nagging pain in the abdominal area. Persistent nausea, vomiting, or diarrhea. A bad-smelling vaginal discharge or foul-smelling urine. Pain when you urinate. Sudden or extreme swelling of your face, hands, ankles, feet, or legs. A fever. Get help right away if you: Have fluid leaking from your vagina. Have spotting or bleeding from your vagina. Have severe abdominal cramping or pain. Have difficulty breathing. Have chest pain. Have fainting spells. Have not felt  your baby move for the time period told by your health care provider. Have new or increased pain, swelling, or redness in an arm or leg. Summary The second trimester of pregnancy is from week 13 through week 27 (months 4 through 6). Do not use herbal remedies, alcohol, illegal drugs, or medicines that are not approved by your health care provider. Chemicals in these products can harm your baby. Exercise only as directed by your health care provider. Most people can continue their usual exercise routine during pregnancy. Keep all follow-up visits. This is important. This information is not intended to replace advice given to you by your health care provider. Make sure you discuss any questions you have with your health care provider. Document Revised: 09/05/2019 Document Reviewed: 07/12/2019 Elsevier Patient Education  2022 Elsevier Inc.  Contraception Choices Contraception, also called birth control, refers to methods or devices that prevent pregnancy. Hormonal methods Contraceptive implant A contraceptive implant is a thin, plastic tube that contains a hormone that prevents pregnancy. It is different from an intrauterine device (IUD). It is inserted into the upper part of the   by a health care provider. Implants can be effective for up to 3 years. Progestin-only injections Progestin-only injections are injections of progestin, a synthetic form of the hormone progesterone. They are given every 3 months by a health care provider. Birth control pills Birth control pills are pills that contain hormones that prevent pregnancy. They must be taken once a day, preferably at the same time each day. A prescription is needed to use this method of contraception. Birth control patch The birth control patch contains hormones that prevent pregnancy. It is placed on the skin and must be changed once a week for three weeks and removed on the fourth week. A prescription is needed to use this method of  contraception. Vaginal ring A vaginal ring contains hormones that prevent pregnancy. It is placed in the vagina for three weeks and removed on the fourth week. After that, the process is repeated with a new ring. A prescription is needed to use this method of contraception. Emergency contraceptive Emergency contraceptives prevent pregnancy after unprotected sex. They come in pill form and can be taken up to 5 days after sex. They work best the sooner they are taken after having sex. Most emergency contraceptives are available without a prescription. This method should not be used as your only form of birth control. Barrier methods Female condom A female condom is a thin sheath that is worn over the penis during sex. Condoms keep sperm from going inside a woman's body. They can be used with a sperm-killing substance (spermicide) to increase their effectiveness. They should be thrown away after one use. Female condom A female condom is a soft, loose-fitting sheath that is put into the vagina before sex. The condom keeps sperm from going inside a woman's body. They should be thrown away after one use. Diaphragm A diaphragm is a soft, dome-shaped barrier. It is inserted into the vagina before sex, along with a spermicide. The diaphragm blocks sperm from entering the uterus, and the spermicide kills sperm. A diaphragm should be left in the vagina for 6-8 hours after sex and removed within 24 hours. A diaphragm is prescribed and fitted by a health care provider. A diaphragm should be replaced every 1-2 years, after giving birth, after gaining more than 15 lb (6.8 kg), and after pelvic surgery. Cervical cap A cervical cap is a round, soft latex or plastic cup that fits over the cervix. It is inserted into the vagina before sex, along with spermicide. It blocks sperm from entering the uterus. The cap should be left in place for 6-8 hours after sex and removed within 48 hours. A cervical cap must be prescribed and  fitted by a health care provider. It should be replaced every 2 years. Sponge A sponge is a soft, circular piece of polyurethane foam with spermicide in it. The sponge helps block sperm from entering the uterus, and the spermicide kills sperm. To use it, you make it wet and then insert it into the vagina. It should be inserted before sex, left in for at least 6 hours after sex, and removed and thrown away within 30 hours. Spermicides Spermicides are chemicals that kill or block sperm from entering the cervix and uterus. They can come as a cream, jelly, suppository, foam, or tablet. A spermicide should be inserted into the vagina with an applicator at least 10-15 minutes before sex to allow time for it to work. The process must be repeated every time you have sex. Spermicides do not require a prescription.  Intrauterine contraception Intrauterine device (IUD) An IUD is a T-shaped device that is put in a woman's uterus. There are two types: Hormone IUD.This type contains progestin, a synthetic form of the hormone progesterone. This type can stay in place for 3-5 years. Copper IUD.This type is wrapped in copper wire. It can stay in place for 10 years. Permanent methods of contraception Female tubal ligation In this method, a woman's fallopian tubes are sealed, tied, or blocked during surgery to prevent eggs from traveling to the uterus. Hysteroscopic sterilization In this method, a small, flexible insert is placed into each fallopian tube. The inserts cause scar tissue to form in the fallopian tubes and block them, so sperm cannot reach an egg. The procedure takes about 3 months to be effective. Another form of birth control must be used during those 3 months. Female sterilization This is a procedure to tie off the tubes that carry sperm (vasectomy). After the procedure, the man can still ejaculate fluid (semen). Another form of birth control must be used for 3 months after the procedure. Natural planning  methods Natural family planning In this method, a couple does not have sex on days when the woman could become pregnant. Calendar method In this method, the woman keeps track of the length of each menstrual cycle, identifies the days when pregnancy can happen, and does not have sex on those days. Ovulation method In this method, a couple avoids sex during ovulation. Symptothermal method This method involves not having sex during ovulation. The woman typically checks for ovulation by watching changes in her temperature and in the consistency of cervical mucus. Post-ovulation method In this method, a couple waits to have sex until after ovulation. Where to find more information Centers for Disease Control and Prevention: FootballExhibition.com.br Summary Contraception, also called birth control, refers to methods or devices that prevent pregnancy. Hormonal methods of contraception include implants, injections, pills, patches, vaginal rings, and emergency contraceptives. Barrier methods of contraception can include female condoms, female condoms, diaphragms, cervical caps, sponges, and spermicides. There are two types of IUDs (intrauterine devices). An IUD can be put in a woman's uterus to prevent pregnancy for 3-5 years. Permanent sterilization can be done through a procedure for males and females. Natural family planning methods involve nothaving sex on days when the woman could become pregnant. This information is not intended to replace advice given to you by your health care provider. Make sure you discuss any questions you have with your health care provider. Document Revised: 09/03/2019 Document Reviewed: 09/03/2019 Elsevier Patient Education  2022 [employer].

## 2021-01-08 NOTE — Progress Notes (Signed)
  Subjective:    Angela Calderon is a G2P0010 [redacted]w[redacted]d being seen today for her first obstetrical visit.  Her obstetrical history is significant for first pregnancy. Patient does intend to breast feed. Pregnancy history fully reviewed.  Patient reports nausea slowly improving with phenergan  Vitals:   01/08/21 1524  BP: 133/81  Pulse: (!) 112  Weight: 141 lb (64 kg)    HISTORY: OB History  Gravida Para Term Preterm AB Living  2 0 0 0 1 0  SAB IAB Ectopic Multiple Live Births  1 0 0 0 0    # Outcome Date GA Lbr Len/2nd Weight Sex Delivery Anes PTL Lv  2 Current           1 SAB            Past Medical History:  Diagnosis Date  . Contraception management 09/05/2020  . UTI (urinary tract infection)    Past Surgical History:  Procedure Laterality Date  . TONSILLECTOMY AND ADENOIDECTOMY Bilateral 09/29/2012   Procedure: TONSILLECTOMY AND ADENOIDECTOMY;  Surgeon: Melvenia Beam, MD;  Location: [facility] OR;  Service: ENT;  Laterality: Bilateral;   Family History  Problem Relation Age of Onset  . Cervical cancer Mother   . Ovarian cancer Maternal Grandmother      Exam    Uterus:     Pelvic Exam:    Perineum: No Hemorrhoids, Normal Perineum   Vulva: normal   Vagina:  normal mucosa, normal discharge   pH:    Cervix: nulliparous appearance and closed and long   Adnexa: normal adnexa   Bony Pelvis: gynecoid  System: Breast:  normal appearance, no masses or tenderness   Skin: normal coloration and turgor, no rashes    Neurologic: oriented, no focal deficits   Extremities: normal strength, tone, and muscle mass   HEENT extra ocular movement intact   Mouth/Teeth mucous membranes moist, pharynx normal without lesions and dental hygiene good   Neck supple and no masses   Cardiovascular: regular rate and rhythm   Respiratory:  appears well, vitals normal, no respiratory distress, acyanotic, normal RR, chest clear, no wheezing, crepitations, rhonchi, normal symmetric air entry    Abdomen: soft, non-tender; bowel sounds normal; no masses,  no organomegaly   Urinary:       Assessment:    Pregnancy: G2P0010 Patient Active Problem List   Diagnosis Date Noted  . Rh negative state in antepartum period 12/13/2020  . Supervision of low-risk first pregnancy, first trimester 11/25/2020  . History of miscarriage 11/25/2020        Plan:     Initial labs drawn. Prenatal vitamins. Problem list reviewed and updated. Genetic Screening discussed : panorama and carrier screening ordered.  Ultrasound discussed; fetal survey: ordered.  Follow up in 4 weeks. 50% of 30 min visit spent on counseling and coordination of care.     Angela Calderon 01/08/2021

## 2021-01-09 ENCOUNTER — Other Ambulatory Visit: Payer: Self-pay | Admitting: *Deleted

## 2021-01-09 MED ORDER — PROMETHAZINE HCL 25 MG PO TABS: 12.5000 mg | ORAL_TABLET | ORAL | 0 refills | Status: DC | PRN

## 2021-01-12 LAB — AB SCR+ANTIBODY ID: Antibody Screen: POSITIVE — AB

## 2021-01-12 LAB — OBSTETRIC PANEL, INCLUDING HIV
Basophils Absolute: 0.1 10*3/uL (ref 0.0–0.2)
Basos: 0 %
EOS (ABSOLUTE): 0.1 10*3/uL (ref 0.0–0.4)
Eos: 1 %
HIV Screen 4th Generation wRfx: NONREACTIVE
Hematocrit: 39.3 % (ref 34.0–46.6)
Hemoglobin: 13.4 g/dL (ref 11.1–15.9)
Hepatitis B Surface Ag: NEGATIVE
Immature Grans (Abs): 0 10*3/uL (ref 0.0–0.1)
Immature Granulocytes: 0 %
Lymphocytes Absolute: 2.3 10*3/uL (ref 0.7–3.1)
Lymphs: 17 %
MCH: 30.7 pg (ref 26.6–33.0)
MCHC: 34.1 g/dL (ref 31.5–35.7)
MCV: 90 fL (ref 79–97)
Monocytes Absolute: 0.9 10*3/uL (ref 0.1–0.9)
Monocytes: 7 %
Neutrophils Absolute: 10.2 10*3/uL — ABNORMAL HIGH (ref 1.4–7.0)
Neutrophils: 75 %
Platelets: 301 10*3/uL (ref 150–450)
RBC: 4.37 x10E6/uL (ref 3.77–5.28)
RDW: 12.6 % (ref 11.7–15.4)
RPR Ser Ql: NONREACTIVE
Rh Factor: NEGATIVE
Rubella Antibodies, IGG: 3.83 index (ref >0.99)
WBC: 13.6 10*3/uL — ABNORMAL HIGH (ref 3.4–10.8)

## 2021-01-16 ENCOUNTER — Encounter: Payer: Self-pay | Admitting: Obstetrics and Gynecology

## 2021-01-19 ENCOUNTER — Encounter: Payer: Self-pay | Admitting: Obstetrics and Gynecology

## 2021-02-13 ENCOUNTER — Other Ambulatory Visit: Payer: Self-pay | Admitting: Obstetrics and Gynecology

## 2021-02-13 ENCOUNTER — Other Ambulatory Visit: Payer: Self-pay

## 2021-02-13 ENCOUNTER — Ambulatory Visit (INDEPENDENT_AMBULATORY_CARE_PROVIDER_SITE_OTHER): Payer: Medicaid Other | Admitting: Obstetrics and Gynecology

## 2021-02-13 VITALS — BP 116/76 | HR 77 | Wt 141.0 lb

## 2021-02-13 DIAGNOSIS — Z3401 Encounter for supervision of normal first pregnancy, first trimester: Secondary | ICD-10-CM | POA: Diagnosis not present

## 2021-02-13 DIAGNOSIS — O2340 Unspecified infection of urinary tract in pregnancy, unspecified trimester: Secondary | ICD-10-CM

## 2021-02-13 DIAGNOSIS — R300 Dysuria: Secondary | ICD-10-CM

## 2021-02-13 DIAGNOSIS — Z3A15 15 weeks gestation of pregnancy: Secondary | ICD-10-CM | POA: Diagnosis not present

## 2021-02-13 DIAGNOSIS — O2332 Infections of other parts of urinary tract in pregnancy, second trimester: Secondary | ICD-10-CM | POA: Diagnosis not present

## 2021-02-13 LAB — POCT URINALYSIS DIPSTICK
Bilirubin, UA: NEGATIVE
Blood, UA: NEGATIVE
Glucose, UA: NEGATIVE
Ketones, UA: POSITIVE
Nitrite, UA: POSITIVE
Protein, UA: POSITIVE — AB
Spec Grav, UA: 1.015 (ref 1.010–1.025)
Urobilinogen, UA: 0.2 E.U./dL
pH, UA: 6 (ref 5.0–8.0)

## 2021-02-13 MED ORDER — CEFADROXIL 500 MG PO CAPS: 500.0000 mg | ORAL_CAPSULE | Freq: Two times a day (BID) | ORAL | 0 refills | Status: AC

## 2021-02-13 NOTE — Progress Notes (Signed)
   PRENATAL VISIT NOTE  Subjective:  Angela Calderon is a 20 y.o. G2P0010 at [redacted]w[redacted]d being seen today for ongoing prenatal care.  She is currently monitored for the following issues for this low-risk pregnancy and has Supervision of low-risk first pregnancy, first trimester; History of miscarriage; and Rh negative state in antepartum period on their problem list.  Patient reports  dysuria. She was treated for a UTI in the first trimester and feels she vomited up the medication. She continues to have dysuria. .   Lockie Pares. Bleeding: None.  Movement: Present. Denies leaking of fluid.   The following portions of the patient's history were reviewed and updated as appropriate: allergies, current medications, past family history, past medical history, past social history, past surgical history and problem list.   Objective:   Vitals:   02/13/21 0838  BP: 116/76  Pulse: 77  Weight: 141 lb (64 kg)    Fetal Status: Fetal Heart Rate (bpm): 145   Movement: Present     General:  Alert, oriented and cooperative. Patient is in no acute distress.  Skin: Skin is warm and dry. No rash noted.   Cardiovascular: Normal heart rate noted  Respiratory: Normal respiratory effort, no problems with respiration noted  Abdomen: Soft, gravid, appropriate for gestational age.  Pain/Pressure: Absent     Pelvic: Cervical exam deferred        Extremities: Normal range of motion.  Edema: None  Mental Status: Normal mood and affect. Normal behavior. Normal judgment and thought content.   Assessment and Plan:  Pregnancy: G2P0010 at [redacted]w[redacted]d  1. Supervision of low-risk first pregnancy, first trimester  AFP today MFM [place] is scheduled She has started feeling fetal movements.   2. Dysuria  UA & urine culture   Preterm labor symptoms and general obstetric precautions including but not limited to vaginal bleeding, contractions, leaking of fluid and fetal movement were reviewed in detail with the patient. Please refer to  After Visit Summary for other counseling recommendations.   No follow-ups on file.  Future Appointments  Date Time Provider Department Center  03/11/2021  1:45 PM [facility] US[facility]    Venia Carbon, NP

## 2021-02-13 NOTE — Addendum Note (Signed)
Addended by: Maretta Bees on: 02/13/2021 10:23 AM   Modules accepted: Orders

## 2021-02-15 LAB — AFP, SERUM, OPEN SPINA BIFIDA
AFP MoM: 1.11
AFP Value: 35.5 ng/mL
Gest. Age on Collection Date: 15 weeks
Maternal Age At EDD: 21.8 yr
OSBR Risk 1 IN: 8647
Test Results:: NEGATIVE
Weight: 141 [lb_av]

## 2021-02-16 ENCOUNTER — Encounter: Payer: Self-pay | Admitting: Obstetrics and Gynecology

## 2021-02-16 DIAGNOSIS — O2340 Unspecified infection of urinary tract in pregnancy, unspecified trimester: Secondary | ICD-10-CM | POA: Insufficient documentation

## 2021-02-24 LAB — URINE CULTURE

## 2021-03-03 ENCOUNTER — Inpatient Hospital Stay (HOSPITAL_COMMUNITY)
Admission: AD | Admit: 2021-03-03 | Discharge: 2021-03-03 | Disposition: A | Discharge status: Home / Self Care | Payer: Medicaid Other | Attending: Obstetrics & Gynecology | Admitting: Obstetrics & Gynecology

## 2021-03-03 ENCOUNTER — Other Ambulatory Visit: Payer: Self-pay

## 2021-03-03 ENCOUNTER — Encounter (HOSPITAL_COMMUNITY): Payer: Self-pay | Admitting: Obstetrics & Gynecology

## 2021-03-03 DIAGNOSIS — O2342 Unspecified infection of urinary tract in pregnancy, second trimester: Secondary | ICD-10-CM | POA: Diagnosis not present

## 2021-03-03 DIAGNOSIS — Z3A17 17 weeks gestation of pregnancy: Secondary | ICD-10-CM | POA: Diagnosis not present

## 2021-03-03 DIAGNOSIS — U071 COVID-19: Secondary | ICD-10-CM | POA: Insufficient documentation

## 2021-03-03 DIAGNOSIS — O98512 Other viral diseases complicating pregnancy, second trimester: Secondary | ICD-10-CM | POA: Diagnosis not present

## 2021-03-03 LAB — CBC WITH DIFFERENTIAL/PLATELET
Abs Immature Granulocytes: 0.09 10*3/uL — ABNORMAL HIGH (ref 0.00–0.07)
Basophils Absolute: 0 10*3/uL (ref 0.0–0.1)
Basophils Relative: 0 %
Eosinophils Absolute: 0 10*3/uL (ref 0.0–0.5)
Eosinophils Relative: 0 %
HCT: 34.4 % — ABNORMAL LOW (ref 36.0–46.0)
Hemoglobin: 11.6 g/dL — ABNORMAL LOW (ref 12.0–15.0)
Immature Granulocytes: 1 %
Lymphocytes Relative: 3 %
Lymphs Abs: 0.4 10*3/uL — ABNORMAL LOW (ref 0.7–4.0)
MCH: 30.6 pg (ref 26.0–34.0)
MCHC: 33.7 g/dL (ref 30.0–36.0)
MCV: 90.8 fL (ref 80.0–100.0)
Monocytes Absolute: 0.8 10*3/uL (ref 0.1–1.0)
Monocytes Relative: 7 %
Neutro Abs: 9.7 10*3/uL — ABNORMAL HIGH (ref 1.7–7.7)
Neutrophils Relative %: 89 %
Platelets: 241 10*3/uL (ref 150–400)
RBC: 3.79 MIL/uL — ABNORMAL LOW (ref 3.87–5.11)
RDW: 12.9 % (ref 11.5–15.5)
WBC: 11 10*3/uL — ABNORMAL HIGH (ref 4.0–10.5)
nRBC: 0 % (ref 0.0–0.2)

## 2021-03-03 LAB — COMPREHENSIVE METABOLIC PANEL
ALT: 16 U/L (ref 0–44)
AST: 20 U/L (ref 15–41)
Albumin: 3.2 g/dL — ABNORMAL LOW (ref 3.5–5.0)
Alkaline Phosphatase: 55 U/L (ref 38–126)
Anion gap: 9 (ref 5–15)
BUN: <5 mg/dL — ABNORMAL LOW (ref 6–20)
CO2: 21 mmol/L — ABNORMAL LOW (ref 22–32)
Calcium: 8.8 mg/dL — ABNORMAL LOW (ref 8.9–10.3)
Chloride: 105 mmol/L (ref 98–111)
Creatinine, Ser: 0.6 mg/dL (ref 0.44–1.00)
GFR, Estimated: >60 mL/min (ref >60)
Glucose, Bld: 78 mg/dL (ref 70–99)
Potassium: 4.3 mmol/L (ref 3.5–5.1)
Sodium: 135 mmol/L (ref 135–145)
Total Bilirubin: 0.5 mg/dL (ref 0.3–1.2)
Total Protein: 5.9 g/dL — ABNORMAL LOW (ref 6.5–8.1)

## 2021-03-03 LAB — URINALYSIS, ROUTINE W REFLEX MICROSCOPIC
Bilirubin Urine: NEGATIVE
Glucose, UA: NEGATIVE mg/dL
Hgb urine dipstick: NEGATIVE
Ketones, ur: NEGATIVE mg/dL
Nitrite: NEGATIVE
Protein, ur: NEGATIVE mg/dL
Specific Gravity, Urine: 1.019 (ref 1.005–1.030)
pH: 5 (ref 5.0–8.0)

## 2021-03-03 LAB — RESP PANEL BY RT-PCR (FLU A&B, COVID) ARPGX2
Influenza A by PCR: NEGATIVE
Influenza B by PCR: NEGATIVE
SARS Coronavirus 2 by RT PCR: POSITIVE — AB

## 2021-03-03 LAB — LACTIC ACID, PLASMA: Lactic Acid, Venous: 0.8 mmol/L (ref 0.5–1.9)

## 2021-03-03 MED ORDER — CEFTRIAXONE SODIUM 1 G IJ SOLR: 2.0000 g | Freq: Once | INTRAMUSCULAR | Status: DC

## 2021-03-03 MED ORDER — SODIUM CHLORIDE 0.9 % IV SOLN
2.0000 g | Freq: Once | INTRAVENOUS | Status: AC
  Administered 2021-03-03: 2 g via INTRAVENOUS
  Filled 2021-03-03: qty 20

## 2021-03-03 MED ORDER — SODIUM CHLORIDE 0.9 % IV SOLN: INTRAVENOUS | Status: DC

## 2021-03-03 MED ORDER — FOSFOMYCIN TROMETHAMINE 3 G PO PACK: 3.0000 g | PACK | Freq: Once | ORAL | 0 refills | Status: AC

## 2021-03-03 NOTE — MAU Note (Signed)
Presents with c/o bilateral flank pain that began as dull intermittent pain 2 weeks ago, but last night pain has worsened and is now constant since 1800.  Also c/o dysuria and burning with urination.   Reports has been on antibiotics for 3 months for UTI treatment, but UTI hasn't cleared completely. Denies VB or LOF, endorses greenish colored vaginal discharge.  Denies vaginal odor.

## 2021-03-03 NOTE — Discharge Instructions (Signed)

## 2021-03-03 NOTE — MAU Provider Note (Signed)
History     CSN: [id number]  Arrival date and time: 03/03/21 [id number]   Event Date/Time   First Provider Initiated Contact with Patient 03/03/21 [id number]      Chief Complaint  Patient presents with   Back Pain   Dysuria   HPI Angela Calderon Angela Calderon is Calderon 20 y.o. G2P0010 at 2114w6d who presents with dysuria and flank pain. Has been diagnosed with Calderon UTI twice since September. Has not been able to complete her antibiotics on either occasion. Most recently she was prescribed duricef on 11/4 but has only been able to take 6 doses. States when she can, she takes the antibiotic at night with her phenergan; last dose was last night.  Since yesterday has had bilateral flank pain - was previously occurring intermittently but now is constant. Rates pain 6/10. Has not treated symptoms. Reports feeling "feverish", has not checked temp at home. N/v is an ongoing issue with the pregnancy. Reports some suprapubic cramping. Denies vaginal bleeding, LOF, or hematuria.  Reports Calderon sore throat since last night. Denies cough, ear pain, or headache.   OB History     Gravida  2   Para  0   Term  0   Preterm  0   AB  1   Living  0      SAB  1   IAB  0   Ectopic  0   Multiple  0   Live Births  0           Past Medical History:  Diagnosis Date   Contraception management 09/05/2020   UTI (urinary tract infection)     Past Surgical History:  Procedure Laterality Date   TONSILLECTOMY AND ADENOIDECTOMY Bilateral 09/29/2012   Procedure: TONSILLECTOMY AND ADENOIDECTOMY;  Surgeon: Melvenia BeamMitchell Gore, MD;  [facility];  Service: ENT;  Laterality: Bilateral;    Family History  Problem Relation Age of Onset   Cervical cancer Mother    Ovarian cancer Paternal Grandmother    Diabetes Paternal Grandfather    Congestive Heart Failure Paternal Grandfather     Social History   Tobacco Use   Smoking status: Never    Passive exposure: Yes   Smokeless tobacco: Never  Vaping Use   Vaping Use: Never  used  Substance Use Topics   Alcohol use: Not Currently   Drug use: No    Allergies:  Allergies  Allergen Reactions   Latex Itching    No medications prior to admission.    Review of Systems  Constitutional:  Positive for chills. Negative for fever (hasn't checked temp at home).  HENT:  Positive for sore throat.   Respiratory: Negative.    Gastrointestinal:  Positive for abdominal pain, nausea and vomiting. Negative for constipation and diarrhea.  Genitourinary:  Positive for dysuria and flank pain. Negative for hematuria, vaginal bleeding and vaginal discharge.  Physical Exam   Blood pressure 108/66, pulse (!) 111, temperature 99.6 F (37.6 C), temperature source Oral, resp. rate 17, height 5\' 3"  (1.6 m), weight 65 kg, last menstrual period 10/29/2020, SpO2 100 %, unknown if currently breastfeeding.  Physical Exam Vitals and nursing note reviewed.  Constitutional:      General: She is not in acute distress.    Appearance: Normal appearance. She is not ill-appearing or toxic-appearing.  HENT:     Head: Normocephalic and atraumatic.  Eyes:     General: No scleral icterus.    Pupils: Pupils are equal, round, and reactive to light.  Cardiovascular:  Rate and Rhythm: Regular rhythm. Tachycardia present.     Heart sounds: Normal heart sounds.  Pulmonary:     Effort: Pulmonary effort is normal. No respiratory distress.     Breath sounds: Normal breath sounds. No wheezing.  Abdominal:     Tenderness: There is no right CVA tenderness.  Skin:    General: Skin is warm and dry.  Neurological:     General: No focal deficit present.     Mental Status: She is alert.  Psychiatric:        Mood and Affect: Mood normal.        Behavior: Behavior normal.    MAU Course  Procedures Results for orders placed or performed during the hospital encounter of 03/03/21 (from the past 24 hour(s))  Urinalysis, Routine w reflex microscopic Urine, Clean Catch     Status: Abnormal    Collection Time: 03/03/21  8:46 AM  Result Value Ref Range   Color, Urine YELLOW YELLOW   APPearance HAZY (Calderon) CLEAR   Specific Gravity, Urine 1.019 1.005 - 1.030   pH 5.0 5.0 - 8.0   Glucose, UA NEGATIVE NEGATIVE mg/dL   Hgb urine dipstick NEGATIVE NEGATIVE   Bilirubin Urine NEGATIVE NEGATIVE   Ketones, ur NEGATIVE NEGATIVE mg/dL   Protein, ur NEGATIVE NEGATIVE mg/dL   Nitrite NEGATIVE NEGATIVE   Leukocytes,Ua TRACE (Calderon) NEGATIVE   RBC / HPF 0-5 0 - 5 RBC/hpf   WBC, UA 0-5 0 - 5 WBC/hpf   Bacteria, UA RARE (Calderon) NONE SEEN   Squamous Epithelial / LPF 11-20 0 - 5   Mucus PRESENT   CBC with Differential/Platelet     Status: Abnormal   Collection Time: 03/03/21  9:21 AM  Result Value Ref Range   WBC 11.0 (H) 4.0 - 10.5 K/uL   RBC 3.79 (L) 3.87 - 5.11 MIL/uL   Hemoglobin 11.6 (L) 12.0 - 15.0 g/dL   HCT 71.2 (L) 45.8 - 09.9 %   MCV 90.8 80.0 - 100.0 fL   MCH 30.6 26.0 - 34.0 pg   MCHC 33.7 30.0 - 36.0 g/dL   RDW 83.3 82.5 - 05.3 %   Platelets 241 150 - 400 K/uL   nRBC 0.0 0.0 - 0.2 %   Neutrophils Relative % 89 %   Neutro Abs 9.7 (H) 1.7 - 7.7 K/uL   Lymphocytes Relative 3 %   Lymphs Abs 0.4 (L) 0.7 - 4.0 K/uL   Monocytes Relative 7 %   Monocytes Absolute 0.8 0.1 - 1.0 K/uL   Eosinophils Relative 0 %   Eosinophils Absolute 0.0 0.0 - 0.5 K/uL   Basophils Relative 0 %   Basophils Absolute 0.0 0.0 - 0.1 K/uL   Immature Granulocytes 1 %   Abs Immature Granulocytes 0.09 (H) 0.00 - 0.07 K/uL  Comprehensive metabolic panel     Status: Abnormal   Collection Time: 03/03/21  9:21 AM  Result Value Ref Range   Sodium 135 135 - 145 mmol/L   Potassium 4.3 3.5 - 5.1 mmol/L   Chloride 105 98 - 111 mmol/L   CO2 21 (L) 22 - 32 mmol/L   Glucose, Bld 78 70 - 99 mg/dL   BUN <5 (L) 6 - 20 mg/dL   Creatinine, Ser 9.76 0.44 - 1.00 mg/dL   Calcium 8.8 (L) 8.9 - 10.3 mg/dL   Total Protein 5.9 (L) 6.5 - 8.1 g/dL   Albumin 3.2 (L) 3.5 - 5.0 g/dL   AST 20 15 - 41 U/L  ALT 16 0 - 44 U/L    Alkaline Phosphatase 55 38 - 126 U/L   Total Bilirubin 0.5 0.3 - 1.2 mg/dL   GFR, Estimated >60 >60 mL/min   Anion gap 9 5 - 15  Lactic acid, plasma     Status: None   Collection Time: 03/03/21  9:21 AM  Result Value Ref Range   Lactic Acid, Venous 0.8 0.5 - 1.9 mmol/L  Resp Panel by RT-PCR (Flu Calderon&B, Covid) Nasopharyngeal Swab     Status: Abnormal   Collection Time: 03/03/21  9:50 AM   Specimen: Nasopharyngeal Swab; Nasopharyngeal(NP) swabs in vial transport medium  Result Value Ref Range   SARS Coronavirus 2 by RT PCR POSITIVE (Calderon) NEGATIVE   Influenza Calderon by PCR NEGATIVE NEGATIVE   Influenza B by PCR NEGATIVE NEGATIVE    MDM Urinalysis looks better than previous values. She does not have CVA tenderness. Per consult with Dr. Harolyn Rutherford - will give IV rocephin 2 gm in MAU & 1 dose of fosfomycin at home. Patient is positive for COVID which likely explains her chills, back pain & sore throat. Discussed symptomatic treatment & quarantine.   Assessment and Plan   1. Urinary tract infection in mother during second trimester of pregnancy  -Rx fosfomycin for patient to take after antiemetic -d/c other antibiotics previously prescribed -reviewed s/s of pyelo  2. COVID-19 affecting pregnancy in second trimester  -given list of OTC meds safe in pregnancy  3. [redacted] weeks gestation of pregnancy      Jorje Guild 03/03/2021, 4:59 PM

## 2021-03-04 LAB — CULTURE, OB URINE
Culture: <10000 — AB
Special Requests: NORMAL

## 2021-03-11 ENCOUNTER — Other Ambulatory Visit: Payer: Self-pay

## 2021-03-11 ENCOUNTER — Ambulatory Visit: Payer: Medicaid Other | Attending: Obstetrics and Gynecology

## 2021-03-11 DIAGNOSIS — Z363 Encounter for antenatal screening for malformations: Secondary | ICD-10-CM | POA: Diagnosis not present

## 2021-03-11 DIAGNOSIS — Z3A19 19 weeks gestation of pregnancy: Secondary | ICD-10-CM | POA: Insufficient documentation

## 2021-03-11 DIAGNOSIS — Z3401 Encounter for supervision of normal first pregnancy, first trimester: Secondary | ICD-10-CM

## 2021-03-11 DIAGNOSIS — O2692 Pregnancy related conditions, unspecified, second trimester: Secondary | ICD-10-CM | POA: Diagnosis not present

## 2021-03-13 ENCOUNTER — Encounter: Payer: Medicaid Other | Admitting: Obstetrics and Gynecology

## 2021-03-13 NOTE — Progress Notes (Deleted)
Patient did not keep ROB appointment. Unable to reach patient for virtual appointment

## 2021-03-16 ENCOUNTER — Other Ambulatory Visit: Payer: Self-pay | Admitting: *Deleted

## 2021-03-16 DIAGNOSIS — Z362 Encounter for other antenatal screening follow-up: Secondary | ICD-10-CM

## 2021-03-19 ENCOUNTER — Other Ambulatory Visit: Payer: Self-pay | Admitting: Obstetrics

## 2021-03-20 MED ORDER — PROMETHAZINE HCL 25 MG PO TABS: 12.5000 mg | ORAL_TABLET | ORAL | 0 refills | Status: DC | PRN

## 2021-03-24 ENCOUNTER — Other Ambulatory Visit: Payer: Self-pay

## 2021-03-24 ENCOUNTER — Ambulatory Visit (INDEPENDENT_AMBULATORY_CARE_PROVIDER_SITE_OTHER): Payer: Medicaid Other | Admitting: Obstetrics & Gynecology

## 2021-03-24 VITALS — BP 124/76 | HR 84 | Wt 148.0 lb

## 2021-03-24 DIAGNOSIS — Z3401 Encounter for supervision of normal first pregnancy, first trimester: Secondary | ICD-10-CM

## 2021-03-24 DIAGNOSIS — Z6791 Unspecified blood type, Rh negative: Secondary | ICD-10-CM

## 2021-03-24 DIAGNOSIS — O26899 Other specified pregnancy related conditions, unspecified trimester: Secondary | ICD-10-CM

## 2021-03-24 NOTE — Progress Notes (Signed)
° °  PRENATAL VISIT NOTE  Subjective:  Angela Calderon is a 20 y.o. G2P0010 at [redacted]w[redacted]d being seen today for ongoing prenatal care.  She is currently monitored for the following issues for this low-risk pregnancy and has Supervision of low-risk first pregnancy, first trimester; History of miscarriage; Rh negative state in antepartum period; and UTI in pregnancy on their problem list.  Patient reports no complaints.  Contractions: Not present. Vag. Bleeding: None.  Movement: Present. Denies leaking of fluid.   The following portions of the patient's history were reviewed and updated as appropriate: allergies, current medications, past family history, past medical history, past social history, past surgical history and problem list.   Objective:   Vitals:   03/24/21 1528  BP: 124/76  Pulse: 84  Weight: 148 lb (67.1 kg)    Fetal Status: Fetal Heart Rate (bpm): 153   Movement: Present     General:  Alert, oriented and cooperative. Patient is in no acute distress.  Skin: Skin is warm and dry. No rash noted.   Cardiovascular: Normal heart rate noted  Respiratory: Normal respiratory effort, no problems with respiration noted  Abdomen: Soft, gravid, appropriate for gestational age.  Pain/Pressure: Absent     Pelvic: Cervical exam deferred        Extremities: Normal range of motion.  Edema: None  Mental Status: Normal mood and affect. Normal behavior. Normal judgment and thought content.   Assessment and Plan:  Pregnancy: G2P0010 at [redacted]w[redacted]d 1. Supervision of low-risk first pregnancy, first trimester Second trimester doing well  2. Rh negative state in antepartum period   Preterm labor symptoms and general obstetric precautions including but not limited to vaginal bleeding, contractions, leaking of fluid and fetal movement were reviewed in detail with the patient. Please refer to After Visit Summary for other counseling recommendations.   Return in about 4 weeks (around  04/21/2021).  Future Appointments  Date Time Provider Department Center  03/24/2021  3:50 PM Adam Phenix, MD [facility] None  04/09/2021  2:30 PM [facility] NURSE [facility]  04/09/2021  2:45 PM [facility] US5 [facility]    Scheryl Darter, MD

## 2021-04-09 ENCOUNTER — Ambulatory Visit: Payer: Medicaid Other | Admitting: *Deleted

## 2021-04-09 ENCOUNTER — Ambulatory Visit: Payer: Medicaid Other | Attending: Obstetrics and Gynecology

## 2021-04-09 ENCOUNTER — Other Ambulatory Visit: Payer: Self-pay

## 2021-04-09 VITALS — BP 129/76 | HR 101

## 2021-04-09 DIAGNOSIS — Z3A23 23 weeks gestation of pregnancy: Secondary | ICD-10-CM | POA: Diagnosis not present

## 2021-04-09 DIAGNOSIS — O2342 Unspecified infection of urinary tract in pregnancy, second trimester: Secondary | ICD-10-CM | POA: Insufficient documentation

## 2021-04-09 DIAGNOSIS — Z362 Encounter for other antenatal screening follow-up: Secondary | ICD-10-CM | POA: Insufficient documentation

## 2021-04-12 NOTE — L&D Delivery Note (Addendum)
Angela Calderon is a 21 y.o. female G2P0010 with IUP at [redacted]w[redacted]d admitted for IOL for gHTN .  She progressed with cytotec, FB, Pitocin and AROM augmentation to complete and pushed 1 time to deliver.  Cord clamping delayed by several minutes then clamped by CNM and cut by FOB.   ? ?Delivery Note ?At 8:00 PM a viable female was delivered via Vaginal, Spontaneous (Presentation: Right Occiput Anterior).  APGAR: , 9; weight pending.   ?Placenta status: Spontaneous, Intact.  Cord: 3 vessels with the following complications: cord around leg x1 ? ?Anesthesia: Epidural ?Episiotomy: None ?Lacerations: None ?Suture Repair:  n/a ?Est. Blood Loss (mL): 100 ? ?Mom to postpartum.  Baby to Couplet care / Skin to Skin. ? ?Rolm Bookbinder CNM ?07/16/2021, 8:11 PM ? ? ? ?

## 2021-04-22 ENCOUNTER — Other Ambulatory Visit: Payer: Self-pay

## 2021-04-22 ENCOUNTER — Telehealth (INDEPENDENT_AMBULATORY_CARE_PROVIDER_SITE_OTHER): Payer: Medicaid Other | Admitting: Nurse Practitioner

## 2021-04-22 VITALS — BP 131/66 | HR 87

## 2021-04-22 DIAGNOSIS — O2342 Unspecified infection of urinary tract in pregnancy, second trimester: Secondary | ICD-10-CM

## 2021-04-22 DIAGNOSIS — O26899 Other specified pregnancy related conditions, unspecified trimester: Secondary | ICD-10-CM

## 2021-04-22 DIAGNOSIS — Z6791 Unspecified blood type, Rh negative: Secondary | ICD-10-CM

## 2021-04-22 DIAGNOSIS — N390 Urinary tract infection, site not specified: Secondary | ICD-10-CM

## 2021-04-22 DIAGNOSIS — Z3A25 25 weeks gestation of pregnancy: Secondary | ICD-10-CM

## 2021-04-22 DIAGNOSIS — Z3401 Encounter for supervision of normal first pregnancy, first trimester: Secondary | ICD-10-CM

## 2021-04-22 NOTE — Progress Notes (Signed)
OBSTETRICS PRENATAL VIRTUAL VISIT ENCOUNTER NOTE  Provider location: [facility] [place] [facility]   Patient location: Home  I connected with Angela Calderon on 04/22/21 at  2:50 PM EST by MyChart Video Encounter and verified that I am speaking with the correct person using two identifiers. I discussed the limitations, risks, security and privacy concerns of performing an evaluation and management service virtually and the availability of in person appointments. I also discussed with the patient that there may be a patient responsible charge related to this service. The patient expressed understanding and agreed to proceed. Subjective:  Angela Calderon is a 21 y.o. G2P0010 at [redacted]w[redacted]d being seen today for ongoing prenatal care.  She is currently monitored for the following issues for this low-risk pregnancy and has Supervision of low-risk first pregnancy, first trimester; History of miscarriage; Rh negative state in antepartum period; and UTI in pregnancy on their problem list.  Patient reports no complaints.  Contractions: Not present. Vag. Bleeding: None.  Movement: Present. Denies any leaking of fluid.   The following portions of the patient's history were reviewed and updated as appropriate: allergies, current medications, past family history, past medical history, past social history, past surgical history and problem list.   Objective:   Vitals:   04/22/21 1431  BP: 131/66  Pulse: 87    Fetal Status:     Movement: Present     General:  Alert, oriented and cooperative. Patient is in no acute distress.  Respiratory: Normal respiratory effort, no problems with respiration noted  Mental Status: Normal mood and affect. Normal behavior. Normal judgment and thought content.  Rest of physical exam deferred due to type of encounter  Imaging: [place] MFM OB FOLLOW UP  Result Date: 04/09/2021 ----------------------------------------------------------------------  OBSTETRICS  REPORT                       (Signed Final 04/09/2021 03:54 pm) ---------------------------------------------------------------------- Patient Info  ID #:       [id number]                          D.O.B.:  Sep 11, 2000 (20 yrs)  Name:       Angela Calderon              Visit Date: 04/09/2021 03:03 pm ---------------------------------------------------------------------- Performed By  Attending:        Tama High MD        Ref. Address:     [place], [place]  [id number]  Performed By:     Margaretann Loveless     [facility]                                                             [place] [facility]                                                             [facility]  Referred By:      Woodroe Mode                    MD ---------------------------------------------------------------------- Orders  #  Description                           Code        Ordered By  1  [place] MFM OB FOLLOW UP                   [id number]    Peterson Ao ----------------------------------------------------------------------  #  Order #                     Accession #                Episode #  1  [id number]                   [id number]                 [id number] ---------------------------------------------------------------------- Indications  Medical complication of pregnancy (COVID       O26.90  + 03/03/21)  LR NIPS - Female, Neg Horizon, Neg AFP  Antenatal follow-up for nonvisualized fetal    Z36.2  anatomy  [redacted] weeks gestation of pregnancy                Z3A.23 ---------------------------------------------------------------------- Fetal Evaluation  Num Of Fetuses:         1  Fetal Heart Rate(bpm):  129  Cardiac Activity:       Observed  Presentation:           Cephalic   Placenta:               Posterior  P. Cord Insertion:      Previously Visualized  Amniotic Fluid  AFI FV:      Within normal limits                              Largest Pocket(cm)  4.52 ---------------------------------------------------------------------- Biometry  BPD:      57.2  mm     G. Age:  23w 4d         59  %    CI:        71.73   %    70 - 86                                                          FL/HC:      19.0   %    19.2 - 20.8  HC:       215   mm     G. Age:  23w 4d         51  %    HC/AC:      1.13        1.05 - 1.21  AC:       191   mm     G. Age:  23w 6d         64  %    FL/BPD:     71.5   %    71 - 87  FL:       40.9  mm     G. Age:  23w 2d         42  %    FL/AC:      21.4   %    20 - 24  HUM:      37.3  mm     G. Age:  23w 0d         41  %  CER:      25.3  mm     G. Age:  23w 0d         48  %  LV:        5.5  mm  CM:        5.6  mm  Est. FW:     608  gm      1 lb 5 oz     64  % ---------------------------------------------------------------------- OB History  Gravidity:    2          SAB:   1  Living:       0 ---------------------------------------------------------------------- Gestational Age  LMP:           23w 1d        Date:  10/29/20                 EDD:   08/05/21  U/S Today:     23w 4d                                        EDD:   08/02/21  Best:          23w 1d     Det. By:  LMP  (10/29/20)          EDD:   08/05/21 ---------------------------------------------------------------------- Anatomy  Cranium:               Appears normal         LVOT:  Appears normal  Cavum:                 Appears normal         Aortic Arch:            Appears normal  Ventricles:            Appears normal         Ductal Arch:            Appears normal  Choroid Plexus:        Appears normal         Diaphragm:              Appears normal  Cerebellum:            Appears normal         Stomach:                Appears normal, left                                                                         sided  Posterior Fossa:       Appears normal         Abdomen:                Appears normal  Nuchal Fold:           Not applicable ([id number]    Abdominal Wall:         Appears nml (cord                         wks GA)                                        insert, abd wall)  Face:                  Orbits and profile     Cord Vessels:           Appears normal (3                         previously seen                                vessel cord)  Lips:                  Previously seen        Kidneys:                Appear normal  Palate:                Appears normal         Bladder:                Appears normal  Thoracic:              Appears normal         Spine:  Previously seen  Heart:                 Appears normal         Upper Extremities:      Previously seen                         (4CH, axis, and                         situs)  RVOT:                  Appears normal         Lower Extremities:      Previously seen  Other:  VC, 3VV and 3VTV visualized .Fetus appears to be a female. Nasal          bone, lenses, Heels/feet and open hands/5th digits previously          visualized. ---------------------------------------------------------------------- Cervix Uterus Adnexa  Cervix  Length:           3.86  cm.  Normal appearance by transabdominal scan.  Uterus  Normal shape and size.  Right Ovary  Not visualized.  Left Ovary  Not visualized.  Cul De Sac  No free fluid seen. ---------------------------------------------------------------------- Impression  Patient returned for completion of fetal anatomy .Amniotic  fluid is normal and good fetal activity is seen .Fetal biometry  is consistent with her previously-established dates .Fetal  anatomical survey was completed and appears normal. ---------------------------------------------------------------------- Recommendations  Follow-up scans as clinically indicated.  ----------------------------------------------------------------------                  Tama High, MD Electronically Signed Final Report   04/09/2021 03:54 pm ----------------------------------------------------------------------   Assessment and Plan:  Pregnancy: G2P0010 at [redacted]w[redacted]d 1. Supervision of low-risk first pregnancy, first trimester Doing well.  Feeling the baby move well. Wants a waterbirth - advised first step is to take the class and will be talking with midwife in the office later in pregnancy  2. Urinary tract infection in mother during second trimester of pregnancy Virtual visit today - Needs TOC urine culture at next visit  3. Rh negative state in antepartum period Rhogam due at next visit  4. [redacted] weeks gestation of pregnancy   Preterm labor symptoms and general obstetric precautions including but not limited to vaginal bleeding, contractions, leaking of fluid and fetal movement were reviewed in detail with the patient. I discussed the assessment and treatment plan with the patient. The patient was provided an opportunity to ask questions and all were answered. The patient agreed with the plan and demonstrated an understanding of the instructions. The patient was advised to call back or seek an in-person office evaluation/go to MAU at [facility] for any urgent or concerning symptoms. Please refer to After Visit Summary for other counseling recommendations.   I provided 5 minutes of face-to-face time during this encounter.  Return in about 2 weeks (around 05/06/2021) for early AM appt. for ROB and Fasting glucola.  No future appointments.  Virginia Rochester, NP [facility], [place]

## 2021-05-05 ENCOUNTER — Inpatient Hospital Stay (HOSPITAL_COMMUNITY)
Admission: AD | Admit: 2021-05-05 | Discharge: 2021-05-05 | Disposition: A | Discharge status: Home / Self Care | Payer: Medicaid Other | Attending: Obstetrics and Gynecology | Admitting: Obstetrics and Gynecology

## 2021-05-05 ENCOUNTER — Encounter: Payer: Self-pay | Admitting: Obstetrics and Gynecology

## 2021-05-05 ENCOUNTER — Other Ambulatory Visit: Payer: Self-pay

## 2021-05-05 ENCOUNTER — Encounter (HOSPITAL_COMMUNITY): Payer: Self-pay | Admitting: Obstetrics and Gynecology

## 2021-05-05 DIAGNOSIS — Z87440 Personal history of urinary (tract) infections: Secondary | ICD-10-CM | POA: Insufficient documentation

## 2021-05-05 DIAGNOSIS — N390 Urinary tract infection, site not specified: Secondary | ICD-10-CM | POA: Insufficient documentation

## 2021-05-05 DIAGNOSIS — O26892 Other specified pregnancy related conditions, second trimester: Secondary | ICD-10-CM | POA: Insufficient documentation

## 2021-05-05 DIAGNOSIS — O2342 Unspecified infection of urinary tract in pregnancy, second trimester: Secondary | ICD-10-CM | POA: Diagnosis not present

## 2021-05-05 DIAGNOSIS — N898 Other specified noninflammatory disorders of vagina: Secondary | ICD-10-CM | POA: Insufficient documentation

## 2021-05-05 DIAGNOSIS — Z3A26 26 weeks gestation of pregnancy: Secondary | ICD-10-CM | POA: Diagnosis not present

## 2021-05-05 DIAGNOSIS — Z3689 Encounter for other specified antenatal screening: Secondary | ICD-10-CM | POA: Diagnosis not present

## 2021-05-05 LAB — WET PREP, GENITAL
Clue Cells Wet Prep HPF POC: NONE SEEN
Sperm: NONE SEEN
Trich, Wet Prep: NONE SEEN
WBC, Wet Prep HPF POC: >=10 — AB (ref <10)
Yeast Wet Prep HPF POC: NONE SEEN

## 2021-05-05 LAB — POCT FERN TEST: POCT Fern Test: NEGATIVE

## 2021-05-05 NOTE — MAU Note (Signed)
Presents stating LOF that began 1 week ago.  Reports has noticed a lot of moisture in vaginal area, has had to change clothing.  Denies VB.  [place] +FM.

## 2021-05-05 NOTE — MAU Provider Note (Signed)
History   [id number]   Chief Complaint  Patient presents with   Rupture of Membranes    HPI Angela Calderon is a 21 y.o. female  G2P0010 @26 .6 wks here with report of having wet underwear over the last week. At first she thought it was sweat then she thought it was leaking from her vagina. Denies vaginal itching, irritation, or malodor. No gush of fluid. Last IC was 2 days ago. Pt reports no contractions or pain. She denies vaginal bleeding. She reports good fetal movement. All other systems negative.    Patient's last menstrual period was 10/29/2020 (exact date).  OB History  Gravida Para Term Preterm AB Living  2 0 0 0 1 0  SAB IAB Ectopic Multiple Live Births  1 0 0 0 0    # Outcome Date GA Lbr Len/2nd Weight Sex Delivery Anes PTL Lv  2 Current           1 SAB             Past Medical History:  Diagnosis Date   Contraception management 09/05/2020   UTI (urinary tract infection)     Family History  Problem Relation Age of Onset   Cervical cancer Mother    Ovarian cancer Paternal Grandmother    Diabetes Paternal Grandfather    Congestive Heart Failure Paternal Grandfather     Social History   Socioeconomic History   Marital status: Single    Spouse name: Not on file   Number of children: Not on file   Years of education: Not on file   Highest education level: Not on file  Occupational History   Not on file  Tobacco Use   Smoking status: Never    Passive exposure: Yes   Smokeless tobacco: Never  Vaping Use   Vaping Use: Never used  Substance and Sexual Activity   Alcohol use: Not Currently   Drug use: No   Sexual activity: Yes    Partners: Male  Other Topics Concern   Not on file  Social History Narrative   ** Merged History Encounter **       Social Determinants of Health   Financial Resource Strain: Not on file  Food Insecurity: Not on file  Transportation Needs: Not on file  Physical Activity: Not on file  Stress: Not on file  Social  Connections: Not on file    Allergies  Allergen Reactions   Latex Itching    No current facility-administered medications on file prior to encounter.   Current Outpatient Medications on File Prior to Encounter  Medication Sig Dispense Refill   Blood Pressure Monitor DEVI Please check blood pressure once a week. (Patient not taking: Reported on 04/09/2021) 1 each 0   promethazine (PHENERGAN) 25 MG tablet Take 0.5-1 tablets (12.5-25 mg total) by mouth every 4 (four) hours as needed. 30 tablet 0     Review of Systems  Gastrointestinal:  Negative for abdominal pain.  Genitourinary:  Positive for vaginal discharge. Negative for vaginal bleeding.    Physical Exam   Vitals:   05/05/21 1600 05/05/21 1615 05/05/21 1630 05/05/21 1645  BP: 131/70 128/72 120/72 121/66  Pulse: 81 75 76 78  Resp:      Temp:      TempSrc:      SpO2:      Weight:      Height:        Physical Exam Vitals and nursing note reviewed. Exam conducted with a  chaperone present.  Constitutional:      General: She is not in acute distress.    Appearance: Normal appearance.  HENT:     Head: Normocephalic and atraumatic.  Cardiovascular:     Rate and Rhythm: Normal rate.  Pulmonary:     Effort: Pulmonary effort is normal. No respiratory distress.  Abdominal:     Palpations: Abdomen is soft.     Tenderness: There is no abdominal tenderness.  Genitourinary:    Comments: SSE: no pool Musculoskeletal:        General: Normal range of motion.     Cervical back: Normal range of motion.  Skin:    General: Skin is warm and dry.  Neurological:     General: No focal deficit present.     Mental Status: She is alert and oriented to person, place, and time.  Psychiatric:        Mood and Affect: Mood normal.        Behavior: Behavior normal.  EFM: 140 bpm, mod variability, + accels, no decels Toco: none  Results for orders placed or performed during the hospital encounter of 05/05/21 (from the past 24 hour(s))   Wet prep, genital     Status: Abnormal   Collection Time: 05/05/21  4:13 PM  Result Value Ref Range   Yeast Wet Prep HPF POC NONE SEEN NONE SEEN   Trich, Wet Prep NONE SEEN NONE SEEN   Clue Cells Wet Prep HPF POC NONE SEEN NONE SEEN   WBC, Wet Prep HPF POC >=10 (A) <10   Sperm NONE SEEN   Fern Test     Status: None   Collection Time: 05/05/21  4:58 PM  Result Value Ref Range   POCT Fern Test Negative = intact amniotic membranes    MAU Course  Procedures  MDM Labs ordered and reviewed. No signs of PROM. Stable for discharge home.    Assessment and Plan   1. Urinary tract infection in mother during second trimester of pregnancy   2. [redacted] weeks gestation of pregnancy   3. NST (non-stress test) reactive   4. Vaginal discharge during pregnancy in second trimester    Discharge home Follow up at [facility] as scheduled PTL precautions  Allergies as of 05/05/2021       Reactions   Latex Itching        Medication List     TAKE these medications    Blood Pressure Monitor Devi Please check blood pressure once a week.   promethazine 25 MG tablet Commonly known as: PHENERGAN Take 0.5-1 tablets (12.5-25 mg total) by mouth every 4 (four) hours as needed.       Donette Larry, CNM 05/05/2021 5:01 PM

## 2021-05-06 ENCOUNTER — Encounter: Payer: Medicaid Other | Admitting: Nurse Practitioner

## 2021-05-06 ENCOUNTER — Other Ambulatory Visit: Payer: Medicaid Other

## 2021-05-06 LAB — GC/CHLAMYDIA PROBE AMP (~~LOC~~) NOT AT ARMC
Chlamydia: NEGATIVE
Comment: NEGATIVE
Comment: NORMAL
Neisseria Gonorrhea: NEGATIVE

## 2021-05-15 ENCOUNTER — Other Ambulatory Visit: Payer: Medicaid Other

## 2021-05-15 ENCOUNTER — Encounter: Payer: Self-pay | Admitting: Obstetrics and Gynecology

## 2021-05-15 ENCOUNTER — Other Ambulatory Visit: Payer: Self-pay

## 2021-05-15 ENCOUNTER — Ambulatory Visit (INDEPENDENT_AMBULATORY_CARE_PROVIDER_SITE_OTHER): Payer: Medicaid Other | Admitting: Obstetrics and Gynecology

## 2021-05-15 VITALS — BP 117/77 | HR 78 | Wt 156.7 lb

## 2021-05-15 DIAGNOSIS — O26899 Other specified pregnancy related conditions, unspecified trimester: Secondary | ICD-10-CM

## 2021-05-15 DIAGNOSIS — L299 Pruritus, unspecified: Secondary | ICD-10-CM | POA: Insufficient documentation

## 2021-05-15 DIAGNOSIS — Z6791 Unspecified blood type, Rh negative: Secondary | ICD-10-CM | POA: Diagnosis not present

## 2021-05-15 DIAGNOSIS — Z3A28 28 weeks gestation of pregnancy: Secondary | ICD-10-CM

## 2021-05-15 DIAGNOSIS — Z3401 Encounter for supervision of normal first pregnancy, first trimester: Secondary | ICD-10-CM | POA: Diagnosis not present

## 2021-05-15 DIAGNOSIS — O360930 Maternal care for other rhesus isoimmunization, third trimester, not applicable or unspecified: Secondary | ICD-10-CM

## 2021-05-15 HISTORY — DX: Pruritus, unspecified: L29.9

## 2021-05-15 MED ORDER — RHO D IMMUNE GLOBULIN 1500 UNIT/2ML IJ SOSY
300.0000 ug | PREFILLED_SYRINGE | Freq: Once | INTRAMUSCULAR | Status: AC
  Administered 2021-05-15: 300 ug via INTRAMUSCULAR

## 2021-05-15 NOTE — Patient Instructions (Signed)
Third Trimester of Pregnancy The third trimester of pregnancy is from week 28 through week 32. This is months 7 through 9. The third trimester is a time when the unborn baby (fetus) is growing rapidly. At the end of the ninth month, the fetus is about 20 inches long and weighs 6-10 pounds. Body changes during your third trimester During the third trimester, your body will continue to go through many changes. The changes vary and generally return to normal after your baby is born. Physical changes Your weight will continue to increase. You can expect to gain 25-35 pounds (11-16 kg) by the end of the pregnancy if you begin pregnancy at a normal weight. If you are underweight, you can expect to gain 28-40 lb (about 13-18 kg), and if you are overweight, you can expect to gain 15-25 lb (about 7-11 kg). You may begin to get stretch marks on your hips, abdomen, and breasts. Your breasts will continue to grow and may hurt. A yellow fluid (colostrum) may leak from your breasts. This is the first milk you are producing for your baby. You may have changes in your hair. These can include thickening of your hair, rapid growth, and changes in texture. Some people also have hair loss during or after pregnancy, or hair that feels dry or thin. Your belly button may stick out. You may notice more swelling in your hands, face, or ankles. Health changes You may have heartburn. You may have constipation. You may develop hemorrhoids. You may develop swollen, bulging veins (varicose veins) in your legs. You may have increased body aches in the pelvis, back, or thighs. This is due to weight gain and increased hormones that are relaxing your joints. You may have increased tingling or numbness in your hands, arms, and legs. The skin on your abdomen may also feel numb. You may feel short of breath because of your expanding uterus. Other changes You may urinate more often because the fetus is moving lower into your pelvis  and pressing on your bladder. You may have more problems sleeping. This may be caused by the size of your abdomen, an increased need to urinate, and an increase in your body's metabolism. You may notice the fetus "dropping," or moving lower in your abdomen (lightening). You may have increased vaginal discharge. You may notice that you have pain around your pelvic bone as your uterus distends. Follow these instructions at home: Medicines Follow your health care provider's instructions regarding medicine use. Specific medicines may be either safe or unsafe to take during pregnancy. Do not take any medicines unless approved by your health care provider. Take a prenatal vitamin that contains at least 600 micrograms (mcg) of folic acid. Eating and drinking Eat a healthy diet that includes fresh fruits and vegetables, whole grains, good sources of protein such as meat, eggs, or tofu, and low-fat dairy products. Avoid raw meat and unpasteurized juice, milk, and cheese. These carry germs that can harm you and your baby. Eat 4 or 5 small meals rather than 3 large meals a day. You may need to take these actions to prevent or treat constipation: Drink enough fluid to keep your urine pale yellow. Eat foods that are high in fiber, such as beans, whole grains, and fresh fruits and vegetables. Limit foods that are high in fat and processed sugars, such as fried or sweet foods. Activity Exercise only as directed by your health care provider. Most people can continue their usual exercise routine during pregnancy. Try to  exercise for 30 minutes at least 5 days a week. Stop exercising if you experience contractions in the uterus. °Stop exercising if you develop pain or cramping in the lower abdomen or lower back. °Avoid heavy lifting. °Do not exercise if it is very hot or humid or if you are at a high altitude. °If you choose to, you may continue to have sex unless your health care provider tells you not  to. °Relieving pain and discomfort °Take frequent breaks and rest with your legs raised (elevated) if you have leg cramps or low back pain. °Take warm sitz baths to soothe any pain or discomfort caused by hemorrhoids. Use hemorrhoid cream if your health care provider approves. °Wear a supportive bra to prevent discomfort from breast tenderness. °If you develop varicose veins: °Wear support hose as told by your health care provider. °Elevate your feet for 15 minutes, 3-4 times a day. °Limit salt in your diet. °Safety °Talk to your health care provider before traveling far distances. °Do not use hot tubs, steam rooms, or saunas. °Wear your seat belt at all times when driving or riding in a car. °Talk with your health care provider if someone is verbally or physically abusive to you. °Preparing for birth °To prepare for the arrival of your baby: °Take prenatal classes to understand, practice, and ask questions about labor and delivery. °Visit the hospital and tour the maternity area. °Purchase a rear-facing car seat and make sure you know how to install it in your car. °Prepare the baby's room or sleeping area. Make sure to remove all pillows and stuffed animals from the baby's crib to prevent suffocation. °General instructions °Avoid cat litter boxes and soil used by cats. These carry germs that can cause birth defects in the baby. If you have a cat, ask someone to clean the litter box for you. °Do not douche or use tampons. Do not use scented sanitary pads. °Do not use any products that contain nicotine or tobacco, such as cigarettes, e-cigarettes, and chewing tobacco. If you need help quitting, ask your health care provider. °Do not use any herbal remedies, illegal drugs, or medicines that were not prescribed to you. Chemicals in these products can harm your baby. °Do not drink alcohol. °You will have more frequent prenatal exams during the third trimester. During a routine prenatal visit, your health care provider  will do a physical exam, perform tests, and discuss your overall health. Keep all follow-up visits. This is important. °Where to find more information °American Pregnancy Association: americanpregnancy.org °American College of Obstetricians and Gynecologists: acog.org/en/Womens%20Health/Pregnancy °Office on Women's Health: womenshealth.gov/pregnancy °Contact a health care provider if you have: °A fever. °Mild pelvic cramps, pelvic pressure, or nagging pain in your abdominal area or lower back. °Vomiting or diarrhea. °Bad-smelling vaginal discharge or foul-smelling urine. °Pain when you urinate. °A headache that does not go away when you take medicine. °Visual changes or see spots in front of your eyes. °Get help right away if: °Your water breaks. °You have regular contractions less than 5 minutes apart. °You have spotting or bleeding from your vagina. °You have severe abdominal pain. °You have difficulty breathing. °You have chest pain. °You have fainting spells. °You have not felt your baby move for the time period told by your health care provider. °You have new or increased pain, swelling, or redness in an arm or leg. °Summary °The third trimester of pregnancy is from week 28 through week 40 (months 7 through 9). °You may have more problems sleeping.   This can be caused by the size of your abdomen, an increased need to urinate, and an increase in your body's metabolism. You will have more frequent prenatal exams during the third trimester. Keep all follow-up visits. This is important. This information is not intended to replace advice given to you by your health care provider. Make sure you discuss any questions you have with your health care provider. Document Revised: 09/05/2019 Document Reviewed: 07/12/2019 Elsevier Patient Education  2022 [employer].    Considering Hyrum? Guide for patients at [facility]) Why consider waterbirth? Gentle birth for babies  Less pain  medicine used in labor  May allow for passive descent/less pushing  May reduce perineal tears  More mobility and instinctive maternal position changes  Increased maternal relaxation   Is waterbirth safe? What are the risks of infection, drowning or other complications? Infection:  Very low risk (3.7 % for tub vs 4.8% for bed)  7 in 8000 waterbirths with documented infection  Poorly cleaned equipment most common cause  Slightly lower group B strep transmission rate  Drowning  Maternal:  Very low risk  Related to seizures or fainting  Newborn:  Very low risk. No evidence of increased risk of respiratory problems in multiple large studies  Physiological protection from breathing under water  Avoid underwater birth if there are any fetal complications  Once baby's head is out of the water, keep it out.  Birth complication  Some reports of cord trauma, but risk decreased by bringing baby to surface gradually  No evidence of increased risk of shoulder dystocia. Mothers can usually change positions faster in water than in a bed, possibly aiding the maneuvers to free the shoulder.   There are 2 things you MUST do to have a waterbirth with [facility]: Attend a waterbirth class at [facility]   3rd Wednesday of every month from 7-9 pm (virtual during COVID) [employer] at [email or web address] or [email or web address] or by calling [phone] Bring [place] the certificate from the class to your prenatal appointment or send via MyChart Meet with a midwife at 36 weeks* to see if you can still plan a waterbirth and to sign the consent.   *We also recommend that you schedule as many of your prenatal visits with a midwife as possible.    Helpful information: You may want to bring a bathing suit top to the hospital to wear during labor but this is optional.  All other supplies are provided by the hospital. Please arrive at the hospital with signs of active  labor, and do not wait at home until late in labor. It takes 45 min- 2 hours for COVID testing, fetal monitoring, and check in to your room to take place, plus transport and filling of the waterbirth tub.    Things that would prevent you from having a waterbirth: Unknown or Positive COVID-19 diagnosis upon admission to hospital* Premature, <37wks  Previous cesarean birth  Presence of thick meconium-stained fluid  Multiple gestation (Twins, triplets, etc.)  Uncontrolled diabetes or gestational diabetes requiring medication  Hypertension diagnosed in pregnancy or preexisting hypertension (gestational hypertension, preeclampsia, or chronic hypertension) Fetal growth restriction (your baby measures less than 10th percentile on ultrasound) Heavy vaginal bleeding  Non-reassuring fetal heart rate  Active infection (MRSA, etc.). Group B Strep is NOT a contraindication for waterbirth.  If your labor has to be induced and induction method requires continuous monitoring of the baby's heart rate  Other risks/issues  identified by your obstetrical provider   Please remember that birth is unpredictable. Under certain unforeseeable circumstances your provider may advise against giving birth in the tub. These decisions will be made on a case-by-case basis and with the safety of you and your baby as our highest priority.   *Please remember that in order to have a waterbirth, you must test Negative to COVID-19 upon admission to the hospital.  Updated 07/21/20

## 2021-05-15 NOTE — Progress Notes (Signed)
Subjective:  Angela Calderon is a 21 y.o. G2P0010 at 57w2dbeing seen today for ongoing prenatal care.  She is currently monitored for the following issues for this low-risk pregnancy and has Supervision of low-risk first pregnancy, first trimester; History of miscarriage; Rh negative state in antepartum period; and Itching on their problem list.  Patient reports itching on soles of feet.  Contractions: Not present. Vag. Bleeding: None.  Movement: Present. Denies leaking of fluid.   The following portions of the patient's history were reviewed and updated as appropriate: allergies, current medications, past family history, past medical history, past social history, past surgical history and problem list. Problem list updated.  Objective:   Vitals:   05/15/21 0929  BP: 117/77  Pulse: 78  Weight: 156 lb 11.2 oz (71.1 kg)    Fetal Status: Fetal Heart Rate (bpm): 134   Movement: Present     General:  Alert, oriented and cooperative. Patient is in no acute distress.  Skin: Skin is warm and dry. No rash noted.   Cardiovascular: Normal heart rate noted  Respiratory: Normal respiratory effort, no problems with respiration noted  Abdomen: Soft, gravid, appropriate for gestational age. Pain/Pressure: Absent     Pelvic:  Cervical exam deferred        Extremities: Normal range of motion.  Edema: Trace  Mental Status: Normal mood and affect. Normal behavior. Normal judgment and thought content.   Urinalysis:      Assessment and Plan:  Pregnancy: G2P0010 at 231w2d1. Supervision of low-risk first pregnancy, first trimester Stable - Glucose Tolerance, 2 Hours w/1 Hour - CBC - RPR - HIV Antibody (routine testing w rflx) - rho (d) immune globulin (RHIG/RHOPHYLAC) injection 300 mcg Advised to sign up for water birth class  2. Rh negative state in antepartum period  - rho (d) immune globulin (RHIG/RHOPHYLAC) injection 300 mcg  3. Itching  - Comp Met (CMET) - Bile acids,  total  Preterm labor symptoms and general obstetric precautions including but not limited to vaginal bleeding, contractions, leaking of fluid and fetal movement were reviewed in detail with the patient. Please refer to After Visit Summary for other counseling recommendations.  Return in about 2 weeks (around 05/29/2021) for OB visit with midwife to discuss water birth.   ErChancy MilroyMD

## 2021-05-15 NOTE — Progress Notes (Signed)
ROB 28.2 wks GTT, CBC, RPR, HIV today Declines TDAP Depression and anxiety screen negative. RH negative: Rhogam today  Request refill phenergan.  Reports feet have been very itchy. Not relieved by scratching.

## 2021-05-17 ENCOUNTER — Other Ambulatory Visit: Payer: Self-pay | Admitting: Obstetrics

## 2021-05-17 LAB — COMPREHENSIVE METABOLIC PANEL
ALT: 7 IU/L (ref 0–32)
AST: 11 IU/L (ref 0–40)
Albumin/Globulin Ratio: 1.9 (ref 1.2–2.2)
Albumin: 4.2 g/dL (ref 3.9–5.0)
Alkaline Phosphatase: 84 IU/L (ref 44–121)
BUN/Creatinine Ratio: 12 (ref 9–23)
BUN: 6 mg/dL (ref 6–20)
Bilirubin Total: 0.3 mg/dL (ref 0.0–1.2)
CO2: 20 mmol/L (ref 20–29)
Calcium: 9 mg/dL (ref 8.7–10.2)
Chloride: 104 mmol/L (ref 96–106)
Creatinine, Ser: 0.52 mg/dL — ABNORMAL LOW (ref 0.57–1.00)
Globulin, Total: 2.2 g/dL (ref 1.5–4.5)
Glucose: 69 mg/dL — ABNORMAL LOW (ref 70–99)
Potassium: 4.4 mmol/L (ref 3.5–5.2)
Sodium: 139 mmol/L (ref 134–144)
Total Protein: 6.4 g/dL (ref 6.0–8.5)
eGFR: 135 mL/min/{1.73_m2} (ref >59)

## 2021-05-17 LAB — BILE ACIDS, TOTAL: Bile Acids Total: 2.2 umol/L (ref 0.0–10.0)

## 2021-05-17 LAB — GLUCOSE TOLERANCE, 2 HOURS W/ 1HR
Glucose, 1 hour: 128 mg/dL (ref 70–179)
Glucose, 2 hour: 92 mg/dL (ref 70–152)
Glucose, Fasting: 68 mg/dL — ABNORMAL LOW (ref 70–91)

## 2021-05-17 LAB — CBC
Hematocrit: 35.6 % (ref 34.0–46.6)
Hemoglobin: 12.5 g/dL (ref 11.1–15.9)
MCH: 31.9 pg (ref 26.6–33.0)
MCHC: 35.1 g/dL (ref 31.5–35.7)
MCV: 91 fL (ref 79–97)
Platelets: 271 10*3/uL (ref 150–450)
RBC: 3.92 x10E6/uL (ref 3.77–5.28)
RDW: 12.7 % (ref 11.7–15.4)
WBC: 11.4 10*3/uL — ABNORMAL HIGH (ref 3.4–10.8)

## 2021-05-17 LAB — RPR: RPR Ser Ql: NONREACTIVE

## 2021-05-17 LAB — HIV ANTIBODY (ROUTINE TESTING W REFLEX): HIV Screen 4th Generation wRfx: NONREACTIVE

## 2021-05-18 ENCOUNTER — Other Ambulatory Visit: Payer: Self-pay | Admitting: Obstetrics

## 2021-05-18 ENCOUNTER — Encounter: Payer: Self-pay | Admitting: Obstetrics and Gynecology

## 2021-05-19 ENCOUNTER — Other Ambulatory Visit: Payer: Self-pay | Admitting: *Deleted

## 2021-05-19 DIAGNOSIS — Z3401 Encounter for supervision of normal first pregnancy, first trimester: Secondary | ICD-10-CM

## 2021-05-19 MED ORDER — PROMETHAZINE HCL 25 MG PO TABS: 12.5000 mg | ORAL_TABLET | ORAL | 0 refills | Status: DC | PRN

## 2021-05-29 ENCOUNTER — Other Ambulatory Visit: Payer: Self-pay

## 2021-05-29 ENCOUNTER — Encounter: Payer: Self-pay | Admitting: Obstetrics

## 2021-05-29 ENCOUNTER — Ambulatory Visit (INDEPENDENT_AMBULATORY_CARE_PROVIDER_SITE_OTHER): Payer: Medicaid Other | Admitting: Obstetrics

## 2021-05-29 ENCOUNTER — Encounter: Payer: Medicaid Other | Admitting: Obstetrics

## 2021-05-29 VITALS — BP 116/73 | HR 74 | Wt 155.0 lb

## 2021-05-29 DIAGNOSIS — Z3480 Encounter for supervision of other normal pregnancy, unspecified trimester: Secondary | ICD-10-CM

## 2021-05-29 NOTE — Progress Notes (Signed)
ROB 30.2 No unusual complaints

## 2021-05-29 NOTE — Progress Notes (Signed)
Subjective:  Angela Calderon is a 21 y.o. G2P0010 at [redacted]w[redacted]d being seen today for ongoing prenatal care.  She is currently monitored for the following issues for this low-risk pregnancy and has Supervision of low-risk first pregnancy, first trimester; History of miscarriage; Rh negative state in antepartum period; and Itching on their problem list.  Patient reports heartburn.  Contractions: Not present. Vag. Bleeding: None.  Movement: Present. Denies leaking of fluid.   The following portions of the patient's history were reviewed and updated as appropriate: allergies, current medications, past family history, past medical history, past social history, past surgical history and problem list. Problem list updated.  Objective:   Vitals:   05/29/21 1135  BP: 116/73  Pulse: 74  Weight: 155 lb (70.3 kg)    Fetal Status: Fetal Heart Rate (bpm): 144   Movement: Present     General:  Alert, oriented and cooperative. Patient is in no acute distress.  Skin: Skin is warm and dry. No rash noted.   Cardiovascular: Normal heart rate noted  Respiratory: Normal respiratory effort, no problems with respiration noted  Abdomen: Soft, gravid, appropriate for gestational age. Pain/Pressure: Present   FHT: 27 cm  Pelvic:  Cervical exam deferred        Extremities: Normal range of motion.  Edema: None  Mental Status: Normal mood and affect. Normal behavior. Normal judgment and thought content.   Urinalysis:      Assessment and Plan:  Pregnancy: G2P0010 at [redacted]w[redacted]d  1. Supervision of other normal pregnancy, antepartum  2. SGA (small for gestational age) Rx: - [place] MFM OB FOLLOW UP; Future    There are no diagnoses linked to this encounter. Preterm labor symptoms and general obstetric precautions including but not limited to vaginal bleeding, contractions, leaking of fluid and fetal movement were reviewed in detail with the patient. Please refer to After Visit Summary for other counseling  recommendations.   Return in about 2 weeks (around 06/12/2021) for ROB.   Brock Bad, MD  05/29/21

## 2021-06-01 ENCOUNTER — Encounter: Payer: Self-pay | Admitting: Obstetrics

## 2021-06-03 ENCOUNTER — Encounter: Payer: Medicaid Other | Admitting: Advanced Practice Midwife

## 2021-06-03 ENCOUNTER — Telehealth: Payer: Self-pay | Admitting: *Deleted

## 2021-06-03 NOTE — Telephone Encounter (Signed)
TC from patient reporting dizziness and SOB at [redacted] wks GA. Reassured that these symptoms can be common in pregnancy due to changes in anatomy and physiology. Upon further discussion, patient has had 2 bottles of water and 1 soda today and did not eat much for lunch due to nausea. Patient also has a history of anxiety and reports symptoms are similar to that also. Encouraged patient to drink 6-8, 8 oz glasses of water per day. Advised to drink more water now, rest and reassess how she is feeling. Advised to seek care in MAU if symptoms do not resolve, get worse, or for any emergent concerns.

## 2021-06-09 ENCOUNTER — Ambulatory Visit: Payer: Medicaid Other | Admitting: *Deleted

## 2021-06-09 ENCOUNTER — Other Ambulatory Visit: Payer: Self-pay

## 2021-06-09 ENCOUNTER — Ambulatory Visit: Payer: Medicaid Other | Attending: Obstetrics

## 2021-06-09 DIAGNOSIS — Z3A31 31 weeks gestation of pregnancy: Secondary | ICD-10-CM | POA: Diagnosis not present

## 2021-06-09 DIAGNOSIS — Z362 Encounter for other antenatal screening follow-up: Secondary | ICD-10-CM

## 2021-06-10 ENCOUNTER — Other Ambulatory Visit: Payer: Self-pay | Admitting: *Deleted

## 2021-06-10 DIAGNOSIS — Z3493 Encounter for supervision of normal pregnancy, unspecified, third trimester: Secondary | ICD-10-CM

## 2021-06-11 ENCOUNTER — Encounter: Payer: Medicaid Other | Admitting: Women's Health

## 2021-06-14 ENCOUNTER — Encounter: Payer: Self-pay | Admitting: Obstetrics and Gynecology

## 2021-06-14 ENCOUNTER — Inpatient Hospital Stay (HOSPITAL_COMMUNITY)
Admission: AD | Admit: 2021-06-14 | Discharge: 2021-06-14 | Disposition: A | Discharge status: Home / Self Care | Payer: Medicaid Other | Attending: Obstetrics and Gynecology | Admitting: Obstetrics and Gynecology

## 2021-06-14 ENCOUNTER — Encounter (HOSPITAL_COMMUNITY): Payer: Self-pay | Admitting: Obstetrics and Gynecology

## 2021-06-14 ENCOUNTER — Other Ambulatory Visit: Payer: Self-pay

## 2021-06-14 DIAGNOSIS — Z3A32 32 weeks gestation of pregnancy: Secondary | ICD-10-CM | POA: Insufficient documentation

## 2021-06-14 DIAGNOSIS — R102 Pelvic and perineal pain: Secondary | ICD-10-CM | POA: Insufficient documentation

## 2021-06-14 DIAGNOSIS — O26893 Other specified pregnancy related conditions, third trimester: Secondary | ICD-10-CM | POA: Diagnosis not present

## 2021-06-14 DIAGNOSIS — N898 Other specified noninflammatory disorders of vagina: Secondary | ICD-10-CM | POA: Diagnosis not present

## 2021-06-14 LAB — URINALYSIS, ROUTINE W REFLEX MICROSCOPIC
Bilirubin Urine: NEGATIVE
Glucose, UA: 50 mg/dL — AB
Hgb urine dipstick: NEGATIVE
Ketones, ur: NEGATIVE mg/dL
Nitrite: NEGATIVE
Protein, ur: NEGATIVE mg/dL
Specific Gravity, Urine: 1.026 (ref 1.005–1.030)
pH: 6 (ref 5.0–8.0)

## 2021-06-14 LAB — AMNISURE RUPTURE OF MEMBRANE (ROM) NOT AT ARMC: Amnisure ROM: NEGATIVE

## 2021-06-14 LAB — WET PREP, GENITAL
Clue Cells Wet Prep HPF POC: NONE SEEN
Sperm: NONE SEEN
Trich, Wet Prep: NONE SEEN
WBC, Wet Prep HPF POC: >=10 — AB (ref <10)
Yeast Wet Prep HPF POC: NONE SEEN

## 2021-06-14 LAB — POCT FERN TEST: POCT Fern Test: NEGATIVE

## 2021-06-14 NOTE — MAU Note (Signed)
...  Angela Calderon is a 21 y.o. at [redacted]w[redacted]d here in MAU reporting: Patient states while she was sitting on the floor doing her makeup this morning she noticed two wet spots showing through her gray pants. She states she did not feel a gush and did not feel like she had urinated on herself. She states she felt some fluids leaking around an hour ago. Endorses feeling intermittent lower pelvic pressure, specifically when the baby moves. Denies VB. +FM. ? ?Clear fluids and no odor. ? ?Pain scores: Denies pain but states she is feeling pressure. ? ?FHT: 155 initial ?Lab orders placed from triage: Crist Fat test, UA ? ?

## 2021-06-14 NOTE — MAU Provider Note (Signed)
?History  ?  ? ?CSN: [id number] ? ?Arrival date and time: 06/14/21 1443 ? ? Event Date/Time  ? First Provider Initiated Contact with Patient 06/14/21 1554   ?  ? ?Chief Complaint  ?Patient presents with  ? Pelvic Pain  ? Rupture of Membranes  ? ?HPI ? ?[name] is a 21 y.o. female G2P0010 @ [redacted]w[redacted]d here in MAU with concerns of rupture of membranes. She noticed a wet spot on her pants 2 x today. The discharge is whitish clear. She did not feel a gush of fluid at any point. She feels certain that she did not urinate on herself. She reports some occasional, intermittent lower abdominal/ pelvic pressure. She reports pain with baby movement.  ? ?OB History   ? ? Gravida  ?2  ? Para  ?0  ? Term  ?0  ? Preterm  ?0  ? AB  ?1  ? Living  ?0  ?  ? ? SAB  ?1  ? IAB  ?0  ? Ectopic  ?0  ? Multiple  ?0  ? Live Births  ?0  ?   ?  ?  ? ? ?Past Medical History:  ?Diagnosis Date  ? Contraception management 09/05/2020  ? UTI (urinary tract infection)   ? ? ?Past Surgical History:  ?Procedure Laterality Date  ? TONSILLECTOMY AND ADENOIDECTOMY Bilateral 09/29/2012  ? Procedure: TONSILLECTOMY AND ADENOIDECTOMY;  Surgeon: Melvenia Beam, MD;  [facility] OR;  Service: ENT;  Laterality: Bilateral;  ? ? ?Family History  ?Problem Relation Age of Onset  ? Cervical cancer Mother   ? Ovarian cancer Paternal Grandmother   ? Diabetes Paternal Grandfather   ? Congestive Heart Failure Paternal Grandfather   ? ? ?Social History  ? ?Tobacco Use  ? Smoking status: Never  ?  Passive exposure: Yes  ? Smokeless tobacco: Never  ?Vaping Use  ? Vaping Use: Never used  ?Substance Use Topics  ? Alcohol use: Not Currently  ? Drug use: No  ? ? ?Allergies:  ?Allergies  ?Allergen Reactions  ? Latex Itching  ? ? ?Medications Prior to Admission  ?Medication Sig Dispense Refill Last Dose  ? promethazine (PHENERGAN) 25 MG tablet Take 0.5-1 tablets (12.5-25 mg total) by mouth every 4 (four) hours as needed. 30 tablet 0 06/13/2021  ? Blood Pressure Monitor DEVI  Please check blood pressure once a week. (Patient not taking: Reported on 04/09/2021) 1 each 0   ? ?Results for orders placed or performed during the hospital encounter of 06/14/21 (from the past 48 hour(s))  ?Urinalysis, Routine w reflex microscopic Cervix     Status: Abnormal  ? Collection Time: 06/14/21  3:20 PM  ?Result Value Ref Range  ? Color, Urine YELLOW YELLOW  ? APPearance CLOUDY (A) CLEAR  ? Specific Gravity, Urine 1.026 1.005 - 1.030  ? pH 6.0 5.0 - 8.0  ? Glucose, UA 50 (A) NEGATIVE mg/dL  ? Hgb urine dipstick NEGATIVE NEGATIVE  ? Bilirubin Urine NEGATIVE NEGATIVE  ? Ketones, ur NEGATIVE NEGATIVE mg/dL  ? Protein, ur NEGATIVE NEGATIVE mg/dL  ? Nitrite NEGATIVE NEGATIVE  ? Leukocytes,Ua MODERATE (A) NEGATIVE  ? RBC / HPF 6-10 0 - 5 RBC/hpf  ? WBC, UA 6-10 0 - 5 WBC/hpf  ? Bacteria, UA MANY (A) NONE SEEN  ? Squamous Epithelial / LPF 21-50 0 - 5  ? Mucus PRESENT   ? Ca Oxalate Crys, UA PRESENT   ?  Comment: Performed at [facility], [address], [place], [place] [address]  ?Amnisure rupture of membrane (rom)not at [facility]     Status: None  ? Collection Time: 06/14/21  4:12 PM  ?Result Value Ref Range  ? Amnisure ROM NEGATIVE   ?  Comment: Performed at [facility], [address], [place], [place] [address]  ?Fern Test     Status: None  ? Collection Time: 06/14/21  4:13 PM  ?Result Value Ref Range  ? POCT Fern Test Negative = intact amniotic membranes   ?Wet prep, genital     Status: Abnormal  ? Collection Time: 06/14/21  4:14 PM  ? Specimen: Cervix  ?Result Value Ref Range  ? Yeast Wet Prep HPF POC NONE SEEN NONE SEEN  ? Trich, Wet Prep NONE SEEN NONE SEEN  ? Clue Cells Wet Prep HPF POC NONE SEEN NONE SEEN  ? WBC, Wet Prep HPF POC >=10 (A) <10  ?  Comment: Swab received with less than 0.5 mL of saline, saline added to specimen, interpret results with caution.  ? Sperm NONE SEEN   ?  Comment: Performed at [facility], [address] [place], [place] [address]  ?  ? ?Review of Systems   ?Constitutional:  Negative for fever.  ?Gastrointestinal:  Negative for abdominal pain.  ?Genitourinary:  Positive for vaginal discharge. Negative for vaginal bleeding.  ?Physical Exam  ? ?Blood pressure 112/71, pulse 87, temperature 98.2 ?F (36.8 ?C), temperature source Oral, resp. rate 17, last menstrual period 10/29/2020, SpO2 99 %, unknown if currently breastfeeding. ? ?Physical Exam ?Constitutional:   ?   General: She is not in acute distress. ?   Appearance: Normal appearance. She is not ill-appearing, toxic-appearing or diaphoretic.  ?Genitourinary: ?   Comments: Vagina - Small amount of white vaginal discharge, no odor  ?Cervix - No contact bleeding, no active bleeding, no pooling.  ?Bimanual exam: ?Cervix closed ?GC/Chlam, wet prep done ?Chaperone present for exam.   ?Musculoskeletal:     ?   General: Normal range of motion.  ?Neurological:  ?   Mental Status: She is alert and oriented to person, place, and time.  ?Psychiatric:     ?   Behavior: Behavior normal.  ? ?Fetal Tracing: ?Baseline: 140 bpm ?Variability: Moderate  ?Accelerations: 15x15 ?Decelerations: None ?Toco: None ? ?MAU Course  ?Procedures ? ?MDM ? ?Wet prep, amnisure, fern negative.  ?Amnisure  negative.  ? ?Assessment and Plan  ? ?A: ? ?1. Vaginal discharge during pregnancy in third trimester   ?2. Leukorrhea   ?3. [redacted] weeks gestation of pregnancy   ?  ? ?P: ? ?Dc Home ?Return to MAU if symptoms worsen ?Keep your next scheduled appt. ? ? ?Duane Lope, NP ?06/14/2021 ?5:23 PM ? ?

## 2021-06-15 ENCOUNTER — Encounter: Payer: Medicaid Other | Admitting: Obstetrics and Gynecology

## 2021-06-15 ENCOUNTER — Ambulatory Visit (INDEPENDENT_AMBULATORY_CARE_PROVIDER_SITE_OTHER): Payer: Medicaid Other | Admitting: Advanced Practice Midwife

## 2021-06-15 VITALS — BP 121/84 | HR 77 | Wt 155.0 lb

## 2021-06-15 DIAGNOSIS — Z3480 Encounter for supervision of other normal pregnancy, unspecified trimester: Secondary | ICD-10-CM

## 2021-06-15 DIAGNOSIS — Z3A32 32 weeks gestation of pregnancy: Secondary | ICD-10-CM

## 2021-06-15 DIAGNOSIS — O26853 Spotting complicating pregnancy, third trimester: Secondary | ICD-10-CM

## 2021-06-15 DIAGNOSIS — Z609 Problem related to social environment, unspecified: Secondary | ICD-10-CM

## 2021-06-15 DIAGNOSIS — R102 Pelvic and perineal pain: Secondary | ICD-10-CM

## 2021-06-15 DIAGNOSIS — O26893 Other specified pregnancy related conditions, third trimester: Secondary | ICD-10-CM

## 2021-06-15 NOTE — Progress Notes (Signed)
? ?  PRENATAL VISIT NOTE ? ?Subjective:  ?Angela Calderon is a 21 y.o. G2P0010 at [redacted]w[redacted]d being seen today for ongoing prenatal care.  She is currently monitored for the following issues for this low-risk pregnancy and has Supervision of low-risk first pregnancy, first trimester; History of miscarriage; Rh negative state in antepartum period; and Itching on their problem list. ? ?Patient reports  cramping/pelvic pressure, spotting today .  Contractions: Irritability. Vag. Bleeding: None.  Movement: Present. Denies leaking of fluid.  ? ?The following portions of the patient's history were reviewed and updated as appropriate: allergies, current medications, past family history, past medical history, past social history, past surgical history and problem list.  ? ?Objective:  ? ?Vitals:  ? 06/15/21 0953  ?BP: 121/84  ?Pulse: 77  ?Weight: 155 lb (70.3 kg)  ? ? ?Fetal Status: Fetal Heart Rate (bpm): 120   Movement: Present    ? ?General:  Alert, oriented and cooperative. Patient is in no acute distress.  ?Skin: Skin is warm and dry. No rash noted.   ?Cardiovascular: Normal heart rate noted  ?Respiratory: Normal respiratory effort, no problems with respiration noted  ?Abdomen: Soft, gravid, appropriate for gestational age.  Pain/Pressure: Present     ?Pelvic: Cervical exam deferred        ?Extremities: Normal range of motion.  Edema: None  ?Mental Status: Normal mood and affect. Normal behavior. Normal judgment and thought content.  ? ?Assessment and Plan:  ?Pregnancy: G2P0010 at [redacted]w[redacted]d ?1. Supervision of other normal pregnancy, antepartum ?--Anticipatory guidance about next visits/weeks of pregnancy given.  ?--Next appt in 2 weeks ? ?2. [redacted] weeks gestation of pregnancy ? ? ?3. Spotting affecting pregnancy in third trimester ?--Light bleeding after MAU exam yesterday, brown and scant now ?--Likely from exam, offered to check cervix today but pt Ok with reassurance, bleeding precautions reviewed ? ?4. Pelvic pain affecting  pregnancy in third trimester, antepartum ?--Pt very uncomfortable and wants to discuss IOL when possible ?--Discussed elective IOL at 39 weeks, risks of longer labor, possible increase in risk of cesarean ? ? ?5. High risk social situation ?--Pt is 21 years old and has custody of her 2 brothers, the oldest is 20.  FOB is no longer involved.  She reports she has a lot on her plate and is sometimes overwhelmed.  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 ?--Offered IBH, pt declined but will notify if she wants to talk with Sue Lush ? ?Preterm labor symptoms and general obstetric precautions including but not limited to vaginal bleeding, contractions, leaking of fluid and fetal movement were reviewed in detail with the patient. ?Please refer to After Visit Summary for other counseling recommendations.  ? ?Return in about 3 weeks (around 07/06/2021). ? ?Future Appointments  ?Date Time Provider Department Center  ?07/07/2021  1:50 PM [facility]-Kirby, Wilmer Floor, CNM [facility] None  ?07/07/2021  3:30 PM [facility] NURSE [facility]  ?07/07/2021  3:45 PM [facility] US4 [facility]  ? ? ?Sharen Counter, CNM  ?

## 2021-06-15 NOTE — Progress Notes (Signed)
Patient presents for ROB. Reports that she noticed pink tinged d/c after cervical check at MAU yesterday.  ?She also reports some abdominal cramping and discomfort w/ nausea since leaving MAU yesterday.  ?

## 2021-06-16 ENCOUNTER — Ambulatory Visit: Payer: Medicaid Other

## 2021-06-19 ENCOUNTER — Encounter: Payer: Self-pay | Admitting: Advanced Practice Midwife

## 2021-06-22 ENCOUNTER — Telehealth: Payer: Self-pay | Admitting: *Deleted

## 2021-06-22 NOTE — Telephone Encounter (Signed)
TC from pt reporting BP 171/73 over the weekend. Did not recheck BP. At work now and can not recheck BP. Denies HA, visual changes, dizziness, RUQ abd pain. Reports heartburn. Advised patient that I am unable to rule out preE over the phone without a repeat BP. Explained that BP reading is in the severe range. Advised pt to go to MAU for evaluation of severe range BP. Pt verbalized understanding. ?

## 2021-06-29 ENCOUNTER — Telehealth: Payer: Self-pay

## 2021-06-29 ENCOUNTER — Encounter: Payer: Self-pay | Admitting: Advanced Practice Midwife

## 2021-06-29 NOTE — Telephone Encounter (Signed)
Called pt after receiving mychart message and pt asking to have virtual visit with Misty Stanley today. Advised pt there are no available appts but I can forward concerns to provider. Pt declined and stated she will discuss at her visit next week. ?

## 2021-06-30 ENCOUNTER — Other Ambulatory Visit: Payer: Self-pay | Admitting: Advanced Practice Midwife

## 2021-06-30 ENCOUNTER — Encounter: Payer: Self-pay | Admitting: Advanced Practice Midwife

## 2021-06-30 DIAGNOSIS — F419 Anxiety disorder, unspecified: Secondary | ICD-10-CM

## 2021-06-30 MED ORDER — BUSPIRONE HCL 5 MG PO TABS: 5.0000 mg | ORAL_TABLET | Freq: Three times a day (TID) | ORAL | 2 refills | Status: DC | PRN

## 2021-06-30 MED ORDER — SERTRALINE HCL 25 MG PO TABS: 25.0000 mg | ORAL_TABLET | Freq: Every day | ORAL | 5 refills | Status: DC

## 2021-06-30 NOTE — Progress Notes (Signed)
Pt sent MyChart message following up on last prenatal visit. She is reporting symptoms of depression/anxiety and is under a lot of stress with her living situation.  The risks/benefits of therapy and medications were discussed at the last prenatal visit. Rx for Zoloft, lowest dose at 25 mg per pt request, and Buspar 5-10 mg TID PRN sent to pt pharmacy. Pt to f/u at next prenatal visit.  ?

## 2021-07-01 ENCOUNTER — Encounter: Payer: Self-pay | Admitting: Obstetrics

## 2021-07-01 ENCOUNTER — Other Ambulatory Visit: Payer: Self-pay | Admitting: Advanced Practice Midwife

## 2021-07-01 ENCOUNTER — Encounter (HOSPITAL_COMMUNITY): Payer: Self-pay | Admitting: Obstetrics & Gynecology

## 2021-07-01 ENCOUNTER — Telehealth (INDEPENDENT_AMBULATORY_CARE_PROVIDER_SITE_OTHER): Payer: Medicaid Other | Admitting: Obstetrics

## 2021-07-01 ENCOUNTER — Other Ambulatory Visit: Payer: Self-pay

## 2021-07-01 ENCOUNTER — Inpatient Hospital Stay (HOSPITAL_COMMUNITY)
Admission: AD | Admit: 2021-07-01 | Discharge: 2021-07-01 | Disposition: A | Discharge status: Home / Self Care | Payer: Medicaid Other | Attending: Obstetrics & Gynecology | Admitting: Obstetrics & Gynecology

## 2021-07-01 VITALS — BP 124/98 | HR 98 | Wt 152.0 lb

## 2021-07-01 DIAGNOSIS — O133 Gestational [pregnancy-induced] hypertension without significant proteinuria, third trimester: Secondary | ICD-10-CM | POA: Diagnosis not present

## 2021-07-01 DIAGNOSIS — O26893 Other specified pregnancy related conditions, third trimester: Secondary | ICD-10-CM

## 2021-07-01 DIAGNOSIS — O139 Gestational [pregnancy-induced] hypertension without significant proteinuria, unspecified trimester: Secondary | ICD-10-CM

## 2021-07-01 DIAGNOSIS — R030 Elevated blood-pressure reading, without diagnosis of hypertension: Secondary | ICD-10-CM | POA: Diagnosis present

## 2021-07-01 DIAGNOSIS — F419 Anxiety disorder, unspecified: Secondary | ICD-10-CM

## 2021-07-01 DIAGNOSIS — Z3401 Encounter for supervision of normal first pregnancy, first trimester: Secondary | ICD-10-CM

## 2021-07-01 DIAGNOSIS — O219 Vomiting of pregnancy, unspecified: Secondary | ICD-10-CM

## 2021-07-01 DIAGNOSIS — O360910 Maternal care for other rhesus isoimmunization, first trimester, not applicable or unspecified: Secondary | ICD-10-CM

## 2021-07-01 DIAGNOSIS — Z609 Problem related to social environment, unspecified: Secondary | ICD-10-CM

## 2021-07-01 DIAGNOSIS — Z3A35 35 weeks gestation of pregnancy: Secondary | ICD-10-CM | POA: Insufficient documentation

## 2021-07-01 DIAGNOSIS — Z3480 Encounter for supervision of other normal pregnancy, unspecified trimester: Secondary | ICD-10-CM

## 2021-07-01 DIAGNOSIS — O26899 Other specified pregnancy related conditions, unspecified trimester: Secondary | ICD-10-CM

## 2021-07-01 DIAGNOSIS — Z3689 Encounter for other specified antenatal screening: Secondary | ICD-10-CM

## 2021-07-01 DIAGNOSIS — O99343 Other mental disorders complicating pregnancy, third trimester: Secondary | ICD-10-CM

## 2021-07-01 HISTORY — DX: Gestational (pregnancy-induced) hypertension without significant proteinuria, unspecified trimester: O13.9

## 2021-07-01 LAB — COMPREHENSIVE METABOLIC PANEL
ALT: 10 U/L (ref 0–44)
AST: 16 U/L (ref 15–41)
Albumin: 3 g/dL — ABNORMAL LOW (ref 3.5–5.0)
Alkaline Phosphatase: 105 U/L (ref 38–126)
Anion gap: 11 (ref 5–15)
BUN: 5 mg/dL — ABNORMAL LOW (ref 6–20)
CO2: 18 mmol/L — ABNORMAL LOW (ref 22–32)
Calcium: 8.6 mg/dL — ABNORMAL LOW (ref 8.9–10.3)
Chloride: 108 mmol/L (ref 98–111)
Creatinine, Ser: 0.74 mg/dL (ref 0.44–1.00)
GFR, Estimated: >60 mL/min (ref >60)
Glucose, Bld: 84 mg/dL (ref 70–99)
Potassium: 4 mmol/L (ref 3.5–5.1)
Sodium: 137 mmol/L (ref 135–145)
Total Bilirubin: 0.7 mg/dL (ref 0.3–1.2)
Total Protein: 6 g/dL — ABNORMAL LOW (ref 6.5–8.1)

## 2021-07-01 LAB — PROTEIN / CREATININE RATIO, URINE
Creatinine, Urine: 210.27 mg/dL
Protein Creatinine Ratio: 0.09 mg/mg{creat} (ref 0.00–0.15)
Total Protein, Urine: 19 mg/dL

## 2021-07-01 LAB — CBC
HCT: 36 % (ref 36.0–46.0)
Hemoglobin: 12.6 g/dL (ref 12.0–15.0)
MCH: 31.3 pg (ref 26.0–34.0)
MCHC: 35 g/dL (ref 30.0–36.0)
MCV: 89.3 fL (ref 80.0–100.0)
Platelets: 283 10*3/uL (ref 150–400)
RBC: 4.03 MIL/uL (ref 3.87–5.11)
RDW: 13 % (ref 11.5–15.5)
WBC: 12.8 10*3/uL — ABNORMAL HIGH (ref 4.0–10.5)
nRBC: 0 % (ref 0.0–0.2)

## 2021-07-01 MED ORDER — DEXAMETHASONE SODIUM PHOSPHATE 10 MG/ML IJ SOLN
10.0000 mg | Freq: Once | INTRAMUSCULAR | Status: DC
Start: 2021-07-01 — End: 2021-07-01

## 2021-07-01 MED ORDER — LACTATED RINGERS IV SOLN: Freq: Once | INTRAVENOUS | Status: AC

## 2021-07-01 MED ORDER — PROMETHAZINE HCL 25 MG PO TABS: 12.5000 mg | ORAL_TABLET | ORAL | 2 refills | Status: DC | PRN

## 2021-07-01 MED ORDER — PROCHLORPERAZINE EDISYLATE 10 MG/2ML IJ SOLN: 10.0000 mg | Freq: Four times a day (QID) | INTRAMUSCULAR | Status: DC | PRN

## 2021-07-01 MED ORDER — ONDANSETRON HCL 4 MG/2ML IJ SOLN
4.0000 mg | Freq: Once | INTRAMUSCULAR | Status: AC
  Administered 2021-07-01: 4 mg via INTRAVENOUS
  Filled 2021-07-01: qty 2

## 2021-07-01 MED ORDER — ACETAMINOPHEN 500 MG PO TABS
1000.0000 mg | ORAL_TABLET | Freq: Once | ORAL | Status: AC
  Administered 2021-07-01: 1000 mg via ORAL
  Filled 2021-07-01: qty 2

## 2021-07-01 NOTE — Progress Notes (Signed)
? ?OBSTETRICS PRENATAL VIRTUAL VISIT ENCOUNTER NOTE ? ?Provider location: [facility] [place]  ? ?Patient location: Home ? ?I connected with Angela Calderon on 07/01/21 at  9:55 AM EDT by MyChart Video Encounter and verified that I am speaking with the correct person using two identifiers. I discussed the limitations, risks, security and privacy concerns of performing an evaluation and management service virtually and the availability of in person appointments. I also discussed with the patient that there may be a patient responsible charge related to this service. The patient expressed understanding and agreed to proceed. ?Subjective:  ?Angela Calderon is a 21 y.o. G2P0010 at [redacted]w[redacted]d being seen today for ongoing prenatal care.  She is currently monitored for the following issues for this low-risk pregnancy and has Supervision of low-risk first pregnancy, first trimester; History of miscarriage; Rh negative state in antepartum period; and Itching on their problem list. ? ?Patient reports  difficulty breathing and is feeling shaky.  Denies headache or visual changes .  Contractions: Irritability. Vag. Bleeding: None.  Movement: Present. Denies any leaking of fluid.  ? ?The following portions of the patient's history were reviewed and updated as appropriate: allergies, current medications, past family history, past medical history, past social history, past surgical history and problem list.  ? ?Objective:  ? ?Vitals:  ? 07/01/21 0938  ?BP: (!) 124/98  ?Pulse: 98  ?Weight: 152 lb (68.9 kg)  ? ? ?Fetal Status:     Movement: Present    ? ?General:  Alert, oriented and cooperative. Patient is in no acute distress.  ?Respiratory: Normal respiratory effort, no problems with respiration noted  ?Mental Status: Normal mood and affect. Normal behavior. Normal judgment and thought content.  ?Rest of physical exam deferred due to type of encounter ? ?Imaging: ?[place] MFM OB FOLLOW UP ? ?Result Date:  06/09/2021 ?----------------------------------------------------------------------  OBSTETRICS REPORT                       (Signed Final 06/09/2021 04:43 pm) ---------------------------------------------------------------------- Patient Info  ID #:       [id number]                          D.O.B.:  12-17-2000 (20 yrs)  Name:       Angela Calderon              Visit Date: 06/09/2021 03:59 pm ---------------------------------------------------------------------- Performed By  Attending:        Johnell Comings MD         Ref. Address:     [place], [place]  [address]  Performed By:     Jacob Moores BS,       [facility]                                                             [facility]  Referred By:      Woodroe Mode                    MD ---------------------------------------------------------------------- Orders  #  Description                           Code        Ordered By  1  [place] MFM OB FOLLOW UP                   [id number]    Baltazar Najjar ----------------------------------------------------------------------  #  Order #                     Accession #                Episode #  1  [id number]                   [id number]                 [id number] ---------------------------------------------------------------------- Indications  Size-Date Discrepancy                          O26.849  [redacted] weeks gestation of pregnancy                Z3A.31  Medical complication of pregnancy (COVID       O26.90  + 03/03/21)  LR NIPS - Female, Neg Horizon, Neg AFP  Encounter for other antenatal screening        Z36.2  follow-up  ---------------------------------------------------------------------- Fetal Evaluation  Num Of Fetuses:         1  Fetal Heart Rate(bpm):  141  Cardiac Activity:       Observed  Presentation:           Cephalic  Placenta:               Posterior  P. Cord Insertion:      Previously Visualized  Amniotic Fluid  AFI FV:      Within normal limits  AFI Sum(cm)     %Tile       Largest Pocket(cm)  16.2  58          5.8  RUQ(cm)       RLQ(cm)       LUQ(cm)        LLQ(cm)  5.8           3.8           3.3            3.3 ---------------------------------------------------------------------- Biometry  BPD:      83.7  mm     G. Age:  33w 5d         89  %    CI:        76.36   %    70 - 86                                                          FL/HC:      20.1   %    19.1 - 21.3  HC:      303.5  mm     G. Age:  33w 5d         64  %    HC/AC:      1.06        0.96 - 1.17  AC:      286.2  mm     G. Age:  32w 4d         71  %    FL/BPD:     72.8   %    71 - 87  FL:       60.9  mm     G. Age:  31w 4d         31  %    FL/AC:      21.3   %    20 - 24  LV:        8.1  mm  Est. FW:    1997  gm      4 lb 6 oz     62  % ---------------------------------------------------------------------- OB History  Gravidity:    2          SAB:   1  Living:       0 ---------------------------------------------------------------------- Gestational Age  LMP:           31w 6d        Date:  10/29/20                 EDD:   08/05/21  U/S Today:     32w 6d                                        EDD:   07/29/21  Best:          31w 6d     Det. By:  LMP  (10/29/20)          EDD:   08/05/21 ---------------------------------------------------------------------- Anatomy  Cranium:               Appears normal         LVOT:                   Previously seen  Cavum:  Appears normal         Aortic Arch:            Previously seen  Ventricles:            Appears normal         Ductal Arch:            Previously seen  Choroid Plexus:         Previously seen        Diaphragm:              Appears normal  Cerebellum:            Previously seen        Stomach:                Appears normal, left                                                                        sided  Posterior Fossa:       Previously seen        Abdomen:                Previously seen  Nuchal Fold:           Not applicable ([id number]    Abdominal Wall:         Previously seen                         wks GA)  Face:                  Orbits and profile     Cord Vessels:           Previously seen                         previously seen  Lips:                  Previously seen        Kidneys:                Appear normal  Palate:                Previously seen        Bladder:                Appears normal  Thoracic:              Appears normal         Spine:                  Previously seen  Heart:                 Appears normal         Upper Extremities:      Previously seen                         (4CH, axis, and                         situs)  RVOT:  Previously seen        Lower Extremities:      Previously seen  Other:  Female gender previously seen. VC, 3VV, 3VTV, Nasal bone, lenses,          Heels/feet and open hands/5th digits previously visualized.          Technically difficult due to advanced GA and fetal position. ---------------------------------------------------------------------- Cervix Uterus Adnexa  Cervix  Not visualized (advanced GA >24wks)  Uterus  No abnormality visualized.  Right Ovary  Within normal limits.  Left Ovary  Within normal limits.  Cul De Sac  No free fluid seen.  Adnexa  No abnormality visualized. ---------------------------------------------------------------------- Comments  This patient was seen for a follow up growth scan as her  fundal heights were measuring 3 weeks behind her dates.  She denies any other problems in her current pregnancy and  has screened negative for gestational diabetes.  She was informed that the fetal growth and amniotic  fluid  level appears appropriate for her gestational age.  The overall  EFW measures at the 62nd percentile for her gestational age.  As her fundal heights are measuring less than her dates, a  follow-up growth scan was scheduled in 4 weeks. ----------------------

## 2021-07-01 NOTE — Progress Notes (Signed)
Orders placed for IOL 2/2 Gestational Hypertension. Plan for Cytotec with bridge to foley balloon. ? ?Clayton Bibles, MSA, MSN, CNM ?Certified Nurse Midwife, Faculty Practice ?[facility] [employer], [facility] ? ?

## 2021-07-01 NOTE — Progress Notes (Signed)
Virtual ROB Pt. Pt c/o HTN since last night.  ?BP last night 149/88. This morning 132/101. Pt states BP has been high for the last week.  ?

## 2021-07-01 NOTE — MAU Note (Signed)
Randee A Schilling is a 21 y.o. at [redacted]w[redacted]d here in MAU reporting: BP has been high the past 24 hrs.  Started throwing up this morning, can't breath goood.  Doesn't feel good. Slight HA, no visual changes or epigastric pain. States feet have been swelling off and on . No bleeding or LOF.  Reports +FM ? ?Onset of complaint: yesterday ?Pain score: 5- HA ?Vitals:  ? 07/01/21 1029  ?BP: 128/89  ?Pulse: 99  ?Resp: 18  ?Temp: 98.2 ?F (36.8 ?C)  ?SpO2: 99%  ?   ?FHT:130 ?Lab orders placed from triage:  PCR ?

## 2021-07-01 NOTE — MAU Provider Note (Signed)
?History  ?  ? ?CSN: [id number] ? ?Arrival date and time: 07/01/21 1018 ? ? Event Date/Time  ? First Provider Initiated Contact with Patient 07/01/21 1059   ?  ? ?Chief Complaint  ?Patient presents with  ? Headache  ? Hypertension  ? ?HPI ?Angela Calderon is a 21 y.o. G2P0010 at [redacted]w[redacted]d who presents to MAU from [facility] for evaluation of new onset elevated blood pressure. Patient denies history of hypertension.  ? ?Patient also reports anterior headache, new onset this morning. Pain score 5/10. She denies aggravating or alleviating factors.   ? ?Patient reports situational anxiety 2/2 significant life stress. She started Zoloft and Buspar yesterday 06/30/2021. Her anxiety manifests as SOB. On arrival to MAU she denies chest pain, palpitations, activity intolerance. She denies SI, HI, IPV. ? ?Patient reports two episodes of vomiting this morning. She had a sausage biscuit for breakfast then vomited. She has not tried anything PO since that time. ? ?She abdominal pain, vaginal bleeding, leaking of fluid, decreased fetal movement, fever, falls, or recent illness.  ? ? ?OB History   ? ? Gravida  ?2  ? Para  ?0  ? Term  ?0  ? Preterm  ?0  ? AB  ?1  ? Living  ?0  ?  ? ? SAB  ?1  ? IAB  ?0  ? Ectopic  ?0  ? Multiple  ?0  ? Live Births  ?0  ?   ?  ?  ? ? ?Past Medical History:  ?Diagnosis Date  ? Contraception management 09/05/2020  ? UTI (urinary tract infection)   ? ? ?Past Surgical History:  ?Procedure Laterality Date  ? TONSILLECTOMY AND ADENOIDECTOMY Bilateral 09/29/2012  ? Procedure: TONSILLECTOMY AND ADENOIDECTOMY;  Surgeon: Melvenia Beam, MD;  Location: [facility] OR;  Service: ENT;  Laterality: Bilateral;  ? ? ?Family History  ?Problem Relation Age of Onset  ? Cervical cancer Mother   ? Ovarian cancer Paternal Grandmother   ? Diabetes Paternal Grandfather   ? Congestive Heart Failure Paternal Grandfather   ? ? ?Social History  ? ?Tobacco Use  ? Smoking status: Never  ?  Passive exposure: Yes  ? Smokeless tobacco: Never   ?Vaping Use  ? Vaping Use: Never used  ?Substance Use Topics  ? Alcohol use: Not Currently  ? Drug use: No  ? ? ?Allergies:  ?Allergies  ?Allergen Reactions  ? Latex Itching  ? ? ?No medications prior to admission.  ? ? ?Review of Systems  ?Constitutional:  Positive for fatigue.  ?Gastrointestinal:  Positive for nausea and vomiting.  ?Neurological:  Positive for headaches.  ?Psychiatric/Behavioral:    ?     Mild intermittent anxiety  ?All other systems reviewed and are negative. ?Physical Exam  ? ?Blood pressure 123/80, pulse 75, temperature 98 ?F (36.7 ?C), temperature source Oral, resp. rate 16, height 5\' 3"  (1.6 m), weight 70.6 kg, last menstrual period 10/29/2020, SpO2 99 %, unknown if currently breastfeeding. ? ?Physical Exam ?Vitals and nursing note reviewed.  ?Constitutional:   ?   General: She is not in acute distress. ?   Appearance: She is well-developed. She is not ill-appearing.  ?Cardiovascular:  ?   Rate and Rhythm: Normal rate and regular rhythm.  ?   Heart sounds: Normal heart sounds.  ?Pulmonary:  ?   Effort: Pulmonary effort is normal.  ?   Breath sounds: Normal breath sounds.  ?Abdominal:  ?   Palpations: Abdomen is soft.  ?   Comments: Gravid  ?  Skin: ?   General: Skin is warm and dry.  ?   Capillary Refill: Capillary refill takes less than 2 seconds.  ?Neurological:  ?   Mental Status: She is alert and oriented to person, place, and time.  ?Psychiatric:     ?   Mood and Affect: Mood normal.     ?   Speech: Speech normal.     ?   Behavior: Behavior normal.  ? ? ?MAU Course  ?Procedures ? ?MDM ?--EMR reviewed: BP 140/79 on 05/05/2021 in MAU, 124/98 in office this morning ?--Reactive tracing after juice: baseline 135, mod var, + 15 x 15 accels, no decels ?--Toco: UI with rare contraction ?--Cervix closed  ?--Patient verbalized preference for PO Tylenol rather than IV medication for initial headache treatment ?--Headache 2/10 s/p Tylenol ?--Lengthy discussion at bedside regarding diagnosis of GHTN  and indication for IOL during 37th week per guidance from MFM. Given mildly elevated pressures, normal PEC labs, functional primip, encouraged patient to consider IOL during latter part of 37th week. Patient responded that being pregnant is a significant stressor on top of baseline lifestyle stress and she wishes to be induced ASAP. She verbalizes understanding of possibility of lengthy induction process ? ?Patient Vitals for the past 24 hrs: ? BP Temp Temp src Pulse Resp SpO2 Height Weight  ?07/01/21 1203 123/80 98 ?F (36.7 ?C) Oral 75 16 -- -- --  ?07/01/21 1155 -- -- -- -- -- 99 % -- --  ?07/01/21 1150 -- -- -- -- -- 99 % -- --  ?07/01/21 1146 127/80 -- -- 79 -- -- -- --  ?07/01/21 1145 -- -- -- -- -- 99 % -- --  ?07/01/21 1140 -- -- -- -- -- 96 % -- --  ?07/01/21 1135 -- -- -- -- -- 97 % -- --  ?07/01/21 1131 117/75 -- -- 90 -- -- -- --  ?07/01/21 1130 -- -- -- -- -- 97 % -- --  ?07/01/21 1125 -- -- -- -- -- 97 % -- --  ?07/01/21 1120 -- -- -- -- -- 97 % -- --  ?07/01/21 1116 113/76 -- -- 93 -- -- -- --  ?07/01/21 1115 -- -- -- -- -- 97 % -- --  ?07/01/21 1110 -- -- -- -- -- 97 % -- --  ?07/01/21 1105 -- -- -- -- -- 97 % -- --  ?07/01/21 1101 (!) 122/94 -- -- 93 -- -- -- --  ?07/01/21 1100 -- -- -- -- -- 98 % -- --  ?07/01/21 1055 -- -- -- -- -- 97 % -- --  ?07/01/21 1050 -- -- -- -- -- 97 % -- --  ?07/01/21 1049 119/87 -- -- (!) 106 -- -- -- --  ?07/01/21 1029 128/89 98.2 ?F (36.8 ?C) Oral 99 18 99 % 5\' 3"  (1.6 m) 70.6 kg  ? ?Results for orders placed or performed during the hospital encounter of 07/01/21 (from the past 24 hour(s))  ?CBC     Status: Abnormal  ? Collection Time: 07/01/21 10:48 AM  ?Result Value Ref Range  ? WBC 12.8 (H) 4.0 - 10.5 K/uL  ? RBC 4.03 3.87 - 5.11 MIL/uL  ? Hemoglobin 12.6 12.0 - 15.0 g/dL  ? HCT 36.0 36.0 - 46.0 %  ? MCV 89.3 80.0 - 100.0 fL  ? MCH 31.3 26.0 - 34.0 pg  ? MCHC 35.0 30.0 - 36.0 g/dL  ? RDW 13.0 11.5 - 15.5 %  ? Platelets 283 150 - 400 K/uL  ? nRBC  0.0 0.0 - 0.2 %   ?Comprehensive metabolic panel     Status: Abnormal  ? Collection Time: 07/01/21 10:48 AM  ?Result Value Ref Range  ? Sodium 137 135 - 145 mmol/L  ? Potassium 4.0 3.5 - 5.1 mmol/L  ? Chloride 108 98 - 111 mmol/L  ? CO2 18 (L) 22 - 32 mmol/L  ? Glucose, Bld 84 70 - 99 mg/dL  ? BUN 5 (L) 6 - 20 mg/dL  ? Creatinine, Ser 0.74 0.44 - 1.00 mg/dL  ? Calcium 8.6 (L) 8.9 - 10.3 mg/dL  ? Total Protein 6.0 (L) 6.5 - 8.1 g/dL  ? Albumin 3.0 (L) 3.5 - 5.0 g/dL  ? AST 16 15 - 41 U/L  ? ALT 10 0 - 44 U/L  ? Alkaline Phosphatase 105 38 - 126 U/L  ? Total Bilirubin 0.7 0.3 - 1.2 mg/dL  ? GFR, Estimated >60 >60 mL/min  ? Anion gap 11 5 - 15  ?Protein / creatinine ratio, urine     Status: None  ? Collection Time: 07/01/21 10:50 AM  ?Result Value Ref Range  ? Creatinine, Urine 210.27 mg/dL  ? Total Protein, Urine 19 mg/dL  ? Protein Creatinine Ratio 0.09 0.00 - 0.15 mg/mg[Cre]  ? ?Meds ordered this encounter  ?Medications  ? ondansetron (ZOFRAN) injection 4 mg  ? lactated ringers infusion  ? acetaminophen (TYLENOL) tablet 1,000 mg  ? promethazine (PHENERGAN) 25 MG tablet  ?  Sig: Take 0.5-1 tablets (12.5-25 mg total) by mouth every 4 (four) hours as needed.  ?  Dispense:  30 tablet  ?  Refill:  2  ?  Order Specific Question:   Supervising Provider  ?  Answer:   Adam PhenixRNOLD, JAMES G [[id number]]  ? ?Assessment and Plan  ?--21 y.o. G2P0010 at 2564w0d  ?--Reactive Tracing ?--Gestational Hypertension ?--PEC labs WNL ?--Headache improved to 2/10 ?--Refills added to Phenergan per patient request ?--Discharge home in stable condition ? ?F/U ?--Scheduled for IOL at 37.0 per patient request. Indication: Gestational Hypertension.  ? ?Calvert CantorSamantha C Laketha Leopard, CNM ?07/01/2021, 1:25 PM  ?

## 2021-07-06 ENCOUNTER — Telehealth (HOSPITAL_COMMUNITY): Payer: Self-pay | Admitting: *Deleted

## 2021-07-06 NOTE — Telephone Encounter (Signed)
Preadmission screen  

## 2021-07-07 ENCOUNTER — Encounter (HOSPITAL_COMMUNITY): Payer: Self-pay | Admitting: *Deleted

## 2021-07-07 ENCOUNTER — Other Ambulatory Visit (HOSPITAL_COMMUNITY)
Admission: RE | Admit: 2021-07-07 | Discharge: 2021-07-07 | Disposition: A | Discharge status: Home / Self Care | Payer: Medicaid Other | Source: Ambulatory Visit | Attending: Advanced Practice Midwife | Admitting: Advanced Practice Midwife

## 2021-07-07 ENCOUNTER — Ambulatory Visit: Payer: Medicaid Other | Attending: Obstetrics

## 2021-07-07 ENCOUNTER — Ambulatory Visit (INDEPENDENT_AMBULATORY_CARE_PROVIDER_SITE_OTHER): Payer: Medicaid Other | Admitting: Advanced Practice Midwife

## 2021-07-07 ENCOUNTER — Ambulatory Visit: Payer: Medicaid Other

## 2021-07-07 ENCOUNTER — Telehealth (HOSPITAL_COMMUNITY): Payer: Self-pay | Admitting: *Deleted

## 2021-07-07 ENCOUNTER — Other Ambulatory Visit: Payer: Self-pay

## 2021-07-07 VITALS — BP 120/81 | HR 73 | Wt 158.6 lb

## 2021-07-07 DIAGNOSIS — O26843 Uterine size-date discrepancy, third trimester: Secondary | ICD-10-CM | POA: Insufficient documentation

## 2021-07-07 DIAGNOSIS — O0993 Supervision of high risk pregnancy, unspecified, third trimester: Secondary | ICD-10-CM | POA: Insufficient documentation

## 2021-07-07 DIAGNOSIS — Z3A35 35 weeks gestation of pregnancy: Secondary | ICD-10-CM

## 2021-07-07 DIAGNOSIS — Z8616 Personal history of COVID-19: Secondary | ICD-10-CM | POA: Diagnosis not present

## 2021-07-07 DIAGNOSIS — O99343 Other mental disorders complicating pregnancy, third trimester: Secondary | ICD-10-CM

## 2021-07-07 DIAGNOSIS — O133 Gestational [pregnancy-induced] hypertension without significant proteinuria, third trimester: Secondary | ICD-10-CM | POA: Insufficient documentation

## 2021-07-07 DIAGNOSIS — F419 Anxiety disorder, unspecified: Secondary | ICD-10-CM

## 2021-07-07 DIAGNOSIS — Z3493 Encounter for supervision of normal pregnancy, unspecified, third trimester: Secondary | ICD-10-CM | POA: Insufficient documentation

## 2021-07-07 LAB — OB RESULTS CONSOLE GC/CHLAMYDIA: Gonorrhea: NEGATIVE

## 2021-07-07 NOTE — Patient Instructions (Signed)
Things to Try After 37 weeks to Encourage Labor/Get Ready for Labor:    Try the Miles Circuit at www.milescircuit.com daily to improve baby's position and encourage the onset of labor.  Walk a little and rest a little every day.  Change positions often.  Cervical Ripening: May try one or both Red Raspberry Leaf capsules or tea:  two 300mg or 400mg tablets with each meal, 2-3 times a day, or 1-3 cups of tea daily  Potential Side Effects Of Raspberry Leaf:  Most women do not experience any side effects from drinking raspberry leaf tea. However, nausea and loose stools are possible   Evening Primrose Oil capsules: take 1 capsule by mouth and place one capsule in the vagina every night.    Some of the potential side effects:  Upset stomach  Loose stools or diarrhea  Headaches  Nausea  Sex can also help the cervix ripen and encourage labor onset.    Labor Precautions Reasons to come to MAU at [facility]:  1.  Contractions are  5 minutes apart or less, each last 1 minute, these have been going on for 1-2 hours, and you cannot walk or talk during them 2.  You have a large gush of fluid, or a trickle of fluid that will not stop and you have to wear a pad 3.  You have bleeding that is bright red, heavier than spotting--like menstrual bleeding (spotting can be normal in early labor or after a check of your cervix) 4.  You do not feel the baby moving like he/she normally does  

## 2021-07-07 NOTE — Telephone Encounter (Signed)
Preadmission screen  

## 2021-07-07 NOTE — Progress Notes (Signed)
Pt in office for ROB visit. She does not have any concerns today.  ?

## 2021-07-07 NOTE — Progress Notes (Signed)
? ?  PRENATAL VISIT NOTE ? ?Subjective:  ?Angela Calderon is a 21 y.o. G2P0010 at [redacted]w[redacted]d being seen today for ongoing prenatal care.  She is currently monitored for the following issues for this high-risk pregnancy and has Supervision of low-risk first pregnancy, first trimester; History of miscarriage; Rh negative state in antepartum period; Itching; and Gestational hypertension on their problem list. ? ?Patient reports no complaints.  Contractions: Not present. Vag. Bleeding: None.  Movement: Present. Denies leaking of fluid.  ? ?The following portions of the patient's history were reviewed and updated as appropriate: allergies, current medications, past family history, past medical history, past social history, past surgical history and problem list.  ? ?Objective:  ? ?Vitals:  ? 07/07/21 1356  ?BP: 120/81  ?Pulse: 73  ?Weight: 158 lb 9.6 oz (71.9 kg)  ? ? ?Fetal Status: Fetal Heart Rate (bpm): 141   Movement: Present    ? ?General:  Alert, oriented and cooperative. Patient is in no acute distress.  ?Skin: Skin is warm and dry. No rash noted.   ?Cardiovascular: Normal heart rate noted  ?Respiratory: Normal respiratory effort, no problems with respiration noted  ?Abdomen: Soft, gravid, appropriate for gestational age.  Pain/Pressure: Absent     ?Pelvic: Cervical exam deferred        ?Extremities: Normal range of motion.  Edema: Trace  ?Mental Status: Normal mood and affect. Normal behavior. Normal judgment and thought content.  ? ?Assessment and Plan:  ?Pregnancy: G2P0010 at [redacted]w[redacted]d ?1. Gestational hypertension, third trimester ?--Pt met criteria at previous visit, scheduled for IOL on 07/15/21 ?--No s/sx of PEC today, normotensive ? ?2. Anxiety during pregnancy in third trimester, antepartum ?--Started Zoloft, wants to stay on 25 mg dose, having nausea with medication ? ?3. Supervision of high risk pregnancy in third trimester ?--Anticipatory guidance about next visits/weeks of pregnancy given. ?--IOL at 37 weeks on  07/15/21 ? ?--GBS/GCC collected today ? ?Term labor symptoms and general obstetric precautions including but not limited to vaginal bleeding, contractions, leaking of fluid and fetal movement were reviewed in detail with the patient. ?Please refer to After Visit Summary for other counseling recommendations.  ? ?Return for IOL scheduled 4/5, schedule PP visit in 6 weeks. ? ?Future Appointments  ?Date Time Provider [place]  ?07/07/2021  3:30 PM [facility] NURSE [facility]  ?07/07/2021  3:45 PM [facility] US4 [facility] [place]  ?07/15/2021  6:30 AM [facility] SCHED ROOM [facility] None  ? ? ?Fatima Blank, CNM  ?

## 2021-07-08 ENCOUNTER — Other Ambulatory Visit: Payer: Self-pay | Admitting: Advanced Practice Midwife

## 2021-07-08 LAB — CERVICOVAGINAL ANCILLARY ONLY
Chlamydia: NEGATIVE
Comment: NEGATIVE
Comment: NORMAL
Neisseria Gonorrhea: NEGATIVE

## 2021-07-11 LAB — CULTURE, BETA STREP (GROUP B ONLY): Strep Gp B Culture: NEGATIVE

## 2021-07-13 ENCOUNTER — Encounter: Payer: Self-pay | Admitting: Advanced Practice Midwife

## 2021-07-14 ENCOUNTER — Other Ambulatory Visit: Payer: Self-pay | Admitting: Advanced Practice Midwife

## 2021-07-14 DIAGNOSIS — F419 Anxiety disorder, unspecified: Secondary | ICD-10-CM

## 2021-07-14 MED ORDER — SERTRALINE HCL 50 MG PO TABS: 50.0000 mg | ORAL_TABLET | Freq: Every day | ORAL | 11 refills | Status: DC

## 2021-07-14 NOTE — Progress Notes (Signed)
Pt sent MyChart message to report she is no longer having side effects (nausea) with Zoloft 25 mg and wants to increase the dose. This was discussed at her previous office visit.  Rx for Zoloft 50 mg sent to pharmacy. ?

## 2021-07-15 ENCOUNTER — Inpatient Hospital Stay (HOSPITAL_COMMUNITY): Payer: Medicaid Other

## 2021-07-15 ENCOUNTER — Inpatient Hospital Stay (HOSPITAL_COMMUNITY)
Admission: AD | Admit: 2021-07-15 | Discharge: 2021-07-18 | DRG: 807 | Disposition: A | Discharge status: Home / Self Care | Payer: Medicaid Other | Attending: Family Medicine | Admitting: Family Medicine

## 2021-07-15 ENCOUNTER — Other Ambulatory Visit: Payer: Self-pay

## 2021-07-15 ENCOUNTER — Encounter (HOSPITAL_COMMUNITY): Payer: Self-pay | Admitting: Family Medicine

## 2021-07-15 DIAGNOSIS — O99344 Other mental disorders complicating childbirth: Secondary | ICD-10-CM | POA: Diagnosis not present

## 2021-07-15 DIAGNOSIS — Z6791 Unspecified blood type, Rh negative: Secondary | ICD-10-CM

## 2021-07-15 DIAGNOSIS — O139 Gestational [pregnancy-induced] hypertension without significant proteinuria, unspecified trimester: Secondary | ICD-10-CM | POA: Diagnosis present

## 2021-07-15 DIAGNOSIS — O134 Gestational [pregnancy-induced] hypertension without significant proteinuria, complicating childbirth: Principal | ICD-10-CM | POA: Diagnosis present

## 2021-07-15 DIAGNOSIS — Z3A37 37 weeks gestation of pregnancy: Secondary | ICD-10-CM | POA: Diagnosis not present

## 2021-07-15 DIAGNOSIS — F419 Anxiety disorder, unspecified: Secondary | ICD-10-CM | POA: Diagnosis not present

## 2021-07-15 DIAGNOSIS — O26893 Other specified pregnancy related conditions, third trimester: Secondary | ICD-10-CM | POA: Diagnosis not present

## 2021-07-15 LAB — PROTEIN / CREATININE RATIO, URINE
Creatinine, Urine: 214.14 mg/dL
Protein Creatinine Ratio: 0.09 mg/mg{Cre} (ref 0.00–0.15)
Total Protein, Urine: 19 mg/dL

## 2021-07-15 LAB — COMPREHENSIVE METABOLIC PANEL
ALT: 10 U/L (ref 0–44)
AST: 19 U/L (ref 15–41)
Albumin: 2.9 g/dL — ABNORMAL LOW (ref 3.5–5.0)
Alkaline Phosphatase: 130 U/L — ABNORMAL HIGH (ref 38–126)
Anion gap: 9 (ref 5–15)
BUN: 5 mg/dL — ABNORMAL LOW (ref 6–20)
CO2: 18 mmol/L — ABNORMAL LOW (ref 22–32)
Calcium: 8.9 mg/dL (ref 8.9–10.3)
Chloride: 110 mmol/L (ref 98–111)
Creatinine, Ser: 0.74 mg/dL (ref 0.44–1.00)
GFR, Estimated: >60 mL/min (ref >60)
Glucose, Bld: 124 mg/dL — ABNORMAL HIGH (ref 70–99)
Potassium: 4.4 mmol/L (ref 3.5–5.1)
Sodium: 137 mmol/L (ref 135–145)
Total Bilirubin: 0.6 mg/dL (ref 0.3–1.2)
Total Protein: 5.5 g/dL — ABNORMAL LOW (ref 6.5–8.1)

## 2021-07-15 LAB — CBC
HCT: 35.6 % — ABNORMAL LOW (ref 36.0–46.0)
Hemoglobin: 12.3 g/dL (ref 12.0–15.0)
MCH: 31.3 pg (ref 26.0–34.0)
MCHC: 34.6 g/dL (ref 30.0–36.0)
MCV: 90.6 fL (ref 80.0–100.0)
Platelets: 288 10*3/uL (ref 150–400)
RBC: 3.93 MIL/uL (ref 3.87–5.11)
RDW: 13 % (ref 11.5–15.5)
WBC: 13.1 10*3/uL — ABNORMAL HIGH (ref 4.0–10.5)
nRBC: 0 % (ref 0.0–0.2)

## 2021-07-15 LAB — TYPE AND SCREEN
ABO/RH(D): O NEG
Antibody Screen: POSITIVE

## 2021-07-15 LAB — RPR
RPR Ser Ql: REACTIVE — AB
RPR Titer: 1:1 {titer}

## 2021-07-15 MED ORDER — FENTANYL CITRATE (PF) 100 MCG/2ML IJ SOLN
100.0000 ug | INTRAMUSCULAR | Status: DC | PRN
  Administered 2021-07-15 – 2021-07-16 (×5): 100 ug via INTRAVENOUS
  Filled 2021-07-15 (×5): qty 2

## 2021-07-15 MED ORDER — MISOPROSTOL 25 MCG QUARTER TABLET
25.0000 ug | ORAL_TABLET | ORAL | Status: DC | PRN
  Administered 2021-07-15: 25 ug via VAGINAL
  Filled 2021-07-15: qty 1

## 2021-07-15 MED ORDER — OXYTOCIN-SODIUM CHLORIDE 30-0.9 UT/500ML-% IV SOLN
2.5000 [IU]/h | INTRAVENOUS | Status: DC
  Administered 2021-07-16: 2.5 [IU]/h via INTRAVENOUS

## 2021-07-15 MED ORDER — FENTANYL CITRATE (PF) 100 MCG/2ML IJ SOLN
INTRAMUSCULAR | Status: AC
  Filled 2021-07-15: qty 2

## 2021-07-15 MED ORDER — EPHEDRINE 5 MG/ML INJ: 10.0000 mg | INTRAVENOUS | Status: DC | PRN

## 2021-07-15 MED ORDER — LIDOCAINE HCL (PF) 1 % IJ SOLN: 30.0000 mL | INTRAMUSCULAR | Status: DC | PRN

## 2021-07-15 MED ORDER — LACTATED RINGERS IV SOLN: 500.0000 mL | Freq: Once | INTRAVENOUS | Status: DC

## 2021-07-15 MED ORDER — PHENYLEPHRINE 40 MCG/ML (10ML) SYRINGE FOR IV PUSH (FOR BLOOD PRESSURE SUPPORT): 80.0000 ug | PREFILLED_SYRINGE | INTRAVENOUS | Status: DC | PRN

## 2021-07-15 MED ORDER — TERBUTALINE SULFATE 1 MG/ML IJ SOLN
0.2500 mg | Freq: Once | INTRAMUSCULAR | Status: DC | PRN
  Filled 2021-07-15: qty 1

## 2021-07-15 MED ORDER — OXYTOCIN-SODIUM CHLORIDE 30-0.9 UT/500ML-% IV SOLN
1.0000 m[IU]/min | INTRAVENOUS | Status: DC
  Administered 2021-07-15 – 2021-07-16 (×2): 2 m[IU]/min via INTRAVENOUS
  Filled 2021-07-15: qty 500

## 2021-07-15 MED ORDER — PROMETHAZINE HCL 25 MG PO TABS
25.0000 mg | ORAL_TABLET | Freq: Four times a day (QID) | ORAL | Status: DC | PRN
  Administered 2021-07-15 – 2021-07-16 (×3): 25 mg via ORAL
  Filled 2021-07-15 (×4): qty 1

## 2021-07-15 MED ORDER — LACTATED RINGERS IV SOLN: INTRAVENOUS | Status: DC

## 2021-07-15 MED ORDER — OXYTOCIN BOLUS FROM INFUSION
333.0000 mL | Freq: Once | INTRAVENOUS | Status: AC
Start: 2021-07-15 — End: 2021-07-16
  Administered 2021-07-16: 333 mL via INTRAVENOUS

## 2021-07-15 MED ORDER — DIPHENHYDRAMINE HCL 50 MG/ML IJ SOLN
12.5000 mg | INTRAMUSCULAR | Status: AC | PRN
  Administered 2021-07-16 (×3): 12.5 mg via INTRAVENOUS
  Filled 2021-07-15 (×3): qty 1

## 2021-07-15 MED ORDER — SERTRALINE HCL 50 MG PO TABS
50.0000 mg | ORAL_TABLET | Freq: Every day | ORAL | Status: DC
  Administered 2021-07-15: 50 mg via ORAL
  Filled 2021-07-15 (×2): qty 1

## 2021-07-15 MED ORDER — LACTATED RINGERS IV SOLN
500.0000 mL | INTRAVENOUS | Status: DC | PRN
  Administered 2021-07-16: 500 mL via INTRAVENOUS

## 2021-07-15 MED ORDER — ACETAMINOPHEN 325 MG PO TABS: 650.0000 mg | ORAL_TABLET | ORAL | Status: DC | PRN

## 2021-07-15 MED ORDER — TERBUTALINE SULFATE 1 MG/ML IJ SOLN
0.2500 mg | Freq: Once | INTRAMUSCULAR | Status: AC | PRN
  Administered 2021-07-16: 0.25 mg via SUBCUTANEOUS

## 2021-07-15 MED ORDER — FENTANYL-BUPIVACAINE-NACL 0.5-0.125-0.9 MG/250ML-% EP SOLN
12.0000 mL/h | EPIDURAL | Status: DC | PRN
  Administered 2021-07-16: 12 mL/h via EPIDURAL
  Filled 2021-07-15: qty 250

## 2021-07-15 MED ORDER — ONDANSETRON HCL 4 MG/2ML IJ SOLN
4.0000 mg | Freq: Four times a day (QID) | INTRAMUSCULAR | Status: DC | PRN
  Administered 2021-07-16: 4 mg via INTRAVENOUS
  Filled 2021-07-15: qty 2

## 2021-07-15 MED ORDER — BUSPIRONE HCL 5 MG PO TABS
5.0000 mg | ORAL_TABLET | Freq: Every day | ORAL | Status: DC
  Administered 2021-07-15: 5 mg via ORAL
  Filled 2021-07-15 (×2): qty 1

## 2021-07-15 MED ORDER — SOD CITRATE-CITRIC ACID 500-334 MG/5ML PO SOLN: 30.0000 mL | ORAL | Status: DC | PRN

## 2021-07-15 MED ORDER — MISOPROSTOL 50MCG HALF TABLET
50.0000 ug | ORAL_TABLET | ORAL | Status: DC
  Administered 2021-07-15: 50 ug via BUCCAL
  Filled 2021-07-15: qty 1

## 2021-07-15 NOTE — Progress Notes (Addendum)
Angela Calderon is a 21 y.o. G2P0010 at [redacted]w[redacted]d by LMP admitted for induction of labor due to [facility]. ? ?Subjective: ?Patient was lying in bed with FOB at bedside. Feeling contractions but pain is tolerable. Discussed starting pitocin and patient would like to proceed. ? ?Objective: ?BP (!) 135/95   Pulse 64   Temp 98.2 ?F (36.8 ?C) (Oral)   Resp 16   Ht 5\' 3"  (1.6 m)   Wt 70.4 kg   LMP 10/29/2020 (Exact Date)   BMI 27.49 kg/m?  ?No intake/output data recorded. ?No intake/output data recorded. ? ?FHT:  FHR: 120 bpm, variability: moderate,  accelerations:  Present,  decelerations:  Absent ?UC:   regular, every 2-3 minutes ?SVE:   Dilation: 3.5 ?Effacement (%): 70 ?Station: -2 ?Exam by:: [email or web address], RN ? ?Labs: ?Lab Results  ?Component Value Date  ? WBC 13.1 (H) 07/15/2021  ? HGB 12.3 07/15/2021  ? HCT 35.6 (L) 07/15/2021  ? MCV 90.6 07/15/2021  ? PLT 288 07/15/2021  ? ? ?Assessment / Plan: ?Induction of labor due to gHTN progressing well. ? ?Labor:  Latent labor, progressing. S/p cytotec x2 and FB. Plan to start Pit and then likely AROM at next check. ?Fetal Wellbeing:  Category I ?Pain Control:   Medications PRN, considering epidural ?I/D:   GBS negative ?Anticipated MOD:  NSVD ? ?09/14/2021 ?07/15/2021, 9:53 PM ? ?GME ATTESTATION:  ?I saw and evaluated the patient. I agree with the findings and the plan of care as documented in the resident?s note. I have made changes to documentation as necessary. ? ?09/14/2021, MD ?OB Fellow, Faculty Practice ?[place], [facility] for [facility] ?07/15/2021 10:10 PM ? ?

## 2021-07-15 NOTE — Progress Notes (Signed)
Angela Calderon is a 21 y.o. G2P0010 at [redacted]w[redacted]d by LMP admitted for induction of labor due to [facility]. ? ?Subjective: ?Patient was lying in bed with FOB at bedside. Cervical exam performed and cervix dilated to 1cm. Discussed FB indication, and it was subsequently placed. Administered 1 dose Cytotec buccally. Patient tolerated FB placement well.  ? ?Objective: ?BP 132/86   Pulse (!) 102   Temp 98.7 ?F (37.1 ?C) (Oral)   Ht 5\' 3"  (1.6 m)   Wt 70.4 kg   LMP 10/29/2020 (Exact Date)   BMI 27.49 kg/m?  ?No intake/output data recorded. ?No intake/output data recorded. ? ?FHT:  FHR: 125 bpm, variability: moderate,  accelerations:  Present,  decelerations:  Absent ?UC:   irregular, every 2-3 minutes and not felt by patient  ?SVE:   Dilation: 1 ?Exam by:: [email or web address] CNM ? ?Labs: ?Lab Results  ?Component Value Date  ? WBC 13.1 (H) 07/15/2021  ? HGB 12.3 07/15/2021  ? HCT 35.6 (L) 07/15/2021  ? MCV 90.6 07/15/2021  ? PLT 288 07/15/2021  ? ? ?Assessment / Plan: ?Induction of labor due to gHTN progressing with Cytotec ? ?Labor:  Progressing with 1 dose Cytotec; cervix dilated to 1cm and FB placed; second dose Cytotec placed buccally  ?Fetal Wellbeing:  Category I ?Pain Control:   undecided; discussed PRN pain control methods ?I/D:   GBS negative ?Anticipated MOD:  NSVD ? ?09/14/2021 ?07/15/2021, 3:57 PM ? ? ?

## 2021-07-15 NOTE — H&P (Addendum)
OBSTETRIC ADMISSION HISTORY AND PHYSICAL ? ?Angela Calderon is a 21 y.o. female G2P0010 with IUP at [redacted]w[redacted]d by LMP presenting for IOL for gHTN. She reports +FMs, No LOF, no VB, no blurry vision, headaches or peripheral edema, and RUQ pain.  She plans on bottle feeding. She declines birth control and plans to be abstinent. She received her prenatal care at  [facility]   ? ?Dating: By LMP --->  Estimated Date of Delivery: 08/05/21 ? ?Sono:   ? ?@35w  6d, CWD, normal anatomy, variable presentation, posterior lie,  34%, EFW 2637g  ? ? ?Prenatal History/Complications:  ?Gestational hypertension ?Anxiety during pregnancy  ?History of miscarriage ?Rh negative state in antepartum period ?High risk social situation  ?Pelvic pain affecting pregnancy in third trimester ?Spotting affecting pregnancy in third trimester ?Urinary tract infection ? ?Past Medical History: ?Past Medical History:  ?Diagnosis Date  ? Contraception management 09/05/2020  ? Pregnancy induced hypertension   ? UTI (urinary tract infection)   ? ? ?Past Surgical History: ?Past Surgical History:  ?Procedure Laterality Date  ? TONSILLECTOMY AND ADENOIDECTOMY Bilateral 09/29/2012  ? Procedure: TONSILLECTOMY AND ADENOIDECTOMY;  Surgeon: Ruby Cola, MD;  Location: [place];  Service: ENT;  Laterality: Bilateral;  ? ? ?Obstetrical History: ?OB History   ? ? Gravida  ?2  ? Para  ?0  ? Term  ?0  ? Preterm  ?0  ? AB  ?1  ? Living  ?0  ?  ? ? SAB  ?1  ? IAB  ?0  ? Ectopic  ?0  ? Multiple  ?0  ? Live Births  ?0  ?   ?  ?  ? ? ?Social History ?Social History  ? ?Socioeconomic History  ? Marital status: Single  ?  Spouse name: Not on file  ? Number of children: Not on file  ? Years of education: Not on file  ? Highest education level: Not on file  ?Occupational History  ? Not on file  ?Tobacco Use  ? Smoking status: Never  ?  Passive exposure: Yes  ? Smokeless tobacco: Never  ?Vaping Use  ? Vaping Use: Never used  ?Substance and Sexual Activity  ? Alcohol use: Not  Currently  ? Drug use: No  ? Sexual activity: Yes  ?  Partners: Male  ?Other Topics Concern  ? Not on file  ?Social History Narrative  ? ** Merged History Encounter **  ?    ? ?Social Determinants of Health  ? ?Financial Resource Strain: Not on file  ?Food Insecurity: Not on file  ?Transportation Needs: Not on file  ?Physical Activity: Not on file  ?Stress: Not on file  ?Social Connections: Not on file  ? ? ?Family History: ?Family History  ?Problem Relation Age of Onset  ? Cervical cancer Mother   ? Ovarian cancer Paternal Grandmother   ? Diabetes Paternal Grandfather   ? Congestive Heart Failure Paternal Grandfather   ? ? ?Allergies: ?Allergies  ?Allergen Reactions  ? Latex Itching  ? ? ?Medications Prior to Admission  ?Medication Sig Dispense Refill Last Dose  ? Blood Pressure Monitor DEVI Please check blood pressure once a week. (Patient not taking: Reported on 07/07/2021) 1 each 0   ? busPIRone (BUSPAR) 5 MG tablet Take 1-2 tablets (5-10 mg total) by mouth 3 (three) times daily as needed. 30 tablet 2   ? promethazine (PHENERGAN) 25 MG tablet Take 0.5-1 tablets (12.5-25 mg total) by mouth every 4 (four) hours as needed. 30 tablet 2   ?  sertraline (ZOLOFT) 50 MG tablet Take 1 tablet (50 mg total) by mouth daily. 30 tablet 11   ? ? ? ?Review of Systems  ? ?All systems reviewed and negative except as stated in HPI ? ?Last menstrual period 10/29/2020, unknown if currently breastfeeding. ?General appearance: alert, cooperative, and appears stated age ?Lungs: clear to auscultation bilaterally ?Heart: regular rate and rhythm ?Abdomen: soft, non-tender; bowel sounds normal ?Extremities: no sign of DVT ?Presentation: cephalic ?Fetal monitoring Baseline: 130 bpm, Variability: Good {> 6 bpm), Accelerations: Reactive, and Decelerations: Absent ?Uterine activity: None ?  ? ? ?Prenatal labs: ?ABO, Rh: O/Negative/-- (09/29 1559) ?Antibody: Positive, See Final Results (09/29 1559) ?Rubella: 3.83 (09/29 1559) ?RPR: Non Reactive  (02/03 0955)  ?HBsAg: Negative (09/29 1559)  ?HIV: Non Reactive (02/03 0955)  ?GBS: Negative/-- (03/28 1441)  ?2 hr Glucola normal (02/03 0955) ?Genetic screening low risk and AFP negative (09/29 0946 and 11/04 0923) ?Anatomy US normal female fetus (11/30 [id number]) ? ?Prenatal Transfer Tool  ?Maternal Diabetes: No ?Genetic Screening: Normal ?Maternal Ultrasounds/Referrals: Normal ?Fetal Ultrasounds or other Referrals:  None ?Maternal Substance Abuse:  No ?Significant Maternal Medications:  Meds include: Zoloft Other: Buspar ?Significant Maternal Lab Results: Group B Strep negative and Rh negative ? ?No results found for this or any previous visit (from the past 24 hour(s)). ? ?Patient Active Problem List  ? Diagnosis Date Noted  ? Gestational hypertension 07/01/2021  ? Itching 05/15/2021  ? Rh negative state in antepartum period 12/13/2020  ? Supervision of low-risk first pregnancy, first trimester 11/25/2020  ? History of miscarriage 11/25/2020  ? ? ?Assessment/Plan:  ?Angela Calderon is a 21 y.o. G2P0010 at [redacted]w[redacted]d here for IOL due to gHTN. ? ?#Labor: cervix is currently closed; confirmed presence of fetal head with bedside U/S; 1 dose vaginal Cytotec administered ?#Pain: patient would like to labor naturally as long as she can but is open to epidural and discussed other PRN pain control methods ?#FWB: Category 1  ?#ID: GBS negative ?#MOF: bottle feeding ?#MOC: abstinence ?#Circ: yes ? ?Chevis Pretty, Student-PA  ?07/15/2021, 10:29 AM ? ?Attestation of Supervision of Student:  I confirm that I have verified the information documented in the physician assistant student?s note and that I have also personally reperformed the history, physical exam and all medical decision making activities.  I have verified that all services and findings are accurately documented in this student's note; and I agree with management and plan as outlined in the documentation. I have also made any necessary editorial changes. ? ?--EFW 34% at  35w 6d ?--Cat I tracing  ?--GBS neg ?--Cytotec #1 placed at 1128 ?--Assess for foley bulb placement with next exam ?--Zoloft and Buspar ordered for bedtime administration per patient preference ?--Patient declines contraception counseling on admission ?--Preemptive teaching: inpatient SW consult for assessment of support system, not a CPS referral ?--Anticipate vaginal birth ? ?Darlina Rumpf, CNM ?Center for [employer], [place] ?07/15/2021 12:02 PM ? ? ?

## 2021-07-16 ENCOUNTER — Inpatient Hospital Stay (HOSPITAL_COMMUNITY): Payer: Medicaid Other | Admitting: Anesthesiology

## 2021-07-16 ENCOUNTER — Encounter (HOSPITAL_COMMUNITY): Payer: Self-pay | Admitting: Family Medicine

## 2021-07-16 DIAGNOSIS — O134 Gestational [pregnancy-induced] hypertension without significant proteinuria, complicating childbirth: Secondary | ICD-10-CM | POA: Diagnosis not present

## 2021-07-16 DIAGNOSIS — Z3A37 37 weeks gestation of pregnancy: Secondary | ICD-10-CM | POA: Diagnosis not present

## 2021-07-16 DIAGNOSIS — O164 Unspecified maternal hypertension, complicating childbirth: Secondary | ICD-10-CM | POA: Diagnosis not present

## 2021-07-16 LAB — CBC
HCT: 34.2 % — ABNORMAL LOW (ref 36.0–46.0)
Hemoglobin: 11.7 g/dL — ABNORMAL LOW (ref 12.0–15.0)
MCH: 31.1 pg (ref 26.0–34.0)
MCHC: 34.2 g/dL (ref 30.0–36.0)
MCV: 91 fL (ref 80.0–100.0)
Platelets: 225 10*3/uL (ref 150–400)
RBC: 3.76 MIL/uL — ABNORMAL LOW (ref 3.87–5.11)
RDW: 13.1 % (ref 11.5–15.5)
WBC: 23.6 10*3/uL — ABNORMAL HIGH (ref 4.0–10.5)
nRBC: 0 % (ref 0.0–0.2)

## 2021-07-16 LAB — T.PALLIDUM AB, TOTAL: T Pallidum Abs: NONREACTIVE

## 2021-07-16 MED ORDER — BUSPIRONE HCL 5 MG PO TABS: 5.0000 mg | ORAL_TABLET | Freq: Three times a day (TID) | ORAL | Status: DC | PRN

## 2021-07-16 MED ORDER — WITCH HAZEL-GLYCERIN EX PADS
1.0000 | MEDICATED_PAD | CUTANEOUS | Status: DC | PRN
Start: 2021-07-16 — End: 2021-07-18

## 2021-07-16 MED ORDER — FENTANYL-BUPIVACAINE-NACL 0.5-0.125-0.9 MG/250ML-% EP SOLN: 12.0000 mL/h | EPIDURAL | Status: DC | PRN

## 2021-07-16 MED ORDER — PHENYLEPHRINE 40 MCG/ML (10ML) SYRINGE FOR IV PUSH (FOR BLOOD PRESSURE SUPPORT): 80.0000 ug | PREFILLED_SYRINGE | INTRAVENOUS | Status: DC | PRN

## 2021-07-16 MED ORDER — BENZOCAINE-MENTHOL 20-0.5 % EX AERO
1.0000 | INHALATION_SPRAY | CUTANEOUS | Status: DC | PRN
Start: 2021-07-16 — End: 2021-07-18

## 2021-07-16 MED ORDER — DIBUCAINE (PERIANAL) 1 % EX OINT
1.0000 | TOPICAL_OINTMENT | CUTANEOUS | Status: DC | PRN
Start: 2021-07-16 — End: 2021-07-18

## 2021-07-16 MED ORDER — LACTATED RINGERS AMNIOINFUSION
INTRAVENOUS | Status: DC
Start: 2021-07-16 — End: 2021-07-16

## 2021-07-16 MED ORDER — COCONUT OIL OIL: 1.0000 "application " | TOPICAL_OIL | Status: DC | PRN

## 2021-07-16 MED ORDER — DIPHENHYDRAMINE HCL 25 MG PO CAPS: 25.0000 mg | ORAL_CAPSULE | Freq: Four times a day (QID) | ORAL | Status: DC | PRN

## 2021-07-16 MED ORDER — LIDOCAINE-EPINEPHRINE (PF) 2 %-1:200000 IJ SOLN
INTRAMUSCULAR | Status: DC | PRN
  Administered 2021-07-16: 5 mL via EPIDURAL

## 2021-07-16 MED ORDER — SERTRALINE HCL 50 MG PO TABS
50.0000 mg | ORAL_TABLET | Freq: Every day | ORAL | Status: DC
  Administered 2021-07-16: 50 mg via ORAL
  Filled 2021-07-16: qty 1

## 2021-07-16 MED ORDER — ONDANSETRON HCL 4 MG PO TABS: 4.0000 mg | ORAL_TABLET | ORAL | Status: DC | PRN

## 2021-07-16 MED ORDER — SIMETHICONE 80 MG PO CHEW: 80.0000 mg | CHEWABLE_TABLET | ORAL | Status: DC | PRN

## 2021-07-16 MED ORDER — SENNOSIDES-DOCUSATE SODIUM 8.6-50 MG PO TABS
2.0000 | ORAL_TABLET | Freq: Every day | ORAL | Status: DC
  Administered 2021-07-17: 2 via ORAL
  Filled 2021-07-16 (×2): qty 2

## 2021-07-16 MED ORDER — EPHEDRINE 5 MG/ML INJ: 10.0000 mg | INTRAVENOUS | Status: DC | PRN

## 2021-07-16 MED ORDER — ZOLPIDEM TARTRATE 5 MG PO TABS
5.0000 mg | ORAL_TABLET | Freq: Once | ORAL | Status: AC
  Administered 2021-07-16: 5 mg via ORAL
  Filled 2021-07-16: qty 1

## 2021-07-16 MED ORDER — ZOLPIDEM TARTRATE 5 MG PO TABS: 5.0000 mg | ORAL_TABLET | Freq: Every evening | ORAL | Status: DC | PRN

## 2021-07-16 MED ORDER — ACETAMINOPHEN 325 MG PO TABS
650.0000 mg | ORAL_TABLET | ORAL | Status: DC | PRN
  Administered 2021-07-17: 650 mg via ORAL
  Filled 2021-07-16: qty 2

## 2021-07-16 MED ORDER — FENTANYL CITRATE (PF) 100 MCG/2ML IJ SOLN
INTRAMUSCULAR | Status: AC
  Filled 2021-07-16: qty 2

## 2021-07-16 MED ORDER — FENTANYL CITRATE (PF) 100 MCG/2ML IJ SOLN
INTRAMUSCULAR | Status: DC | PRN
  Administered 2021-07-16: 100 ug via EPIDURAL

## 2021-07-16 MED ORDER — ONDANSETRON HCL 4 MG/2ML IJ SOLN: 4.0000 mg | INTRAMUSCULAR | Status: DC | PRN

## 2021-07-16 MED ORDER — LACTATED RINGERS IV SOLN: 500.0000 mL | Freq: Once | INTRAVENOUS | Status: DC

## 2021-07-16 MED ORDER — TETANUS-DIPHTH-ACELL PERTUSSIS 5-2.5-18.5 LF-MCG/0.5 IM SUSY: 0.5000 mL | PREFILLED_SYRINGE | Freq: Once | INTRAMUSCULAR | Status: DC

## 2021-07-16 MED ORDER — PRENATAL MULTIVITAMIN CH
1.0000 | ORAL_TABLET | Freq: Every day | ORAL | Status: DC
  Administered 2021-07-17: 1 via ORAL
  Filled 2021-07-16 (×2): qty 1

## 2021-07-16 MED ORDER — IBUPROFEN 600 MG PO TABS
600.0000 mg | ORAL_TABLET | Freq: Four times a day (QID) | ORAL | Status: DC
  Administered 2021-07-16 – 2021-07-18 (×5): 600 mg via ORAL
  Filled 2021-07-16 (×6): qty 1

## 2021-07-16 NOTE — Anesthesia Preprocedure Evaluation (Signed)
Anesthesia Evaluation  ?Patient identified by MRN, date of birth, ID band ?Patient awake ? ? ? ?Reviewed: ?Allergy & Precautions, Patient's Chart, lab work & pertinent test results ? ?Airway ?Mallampati: I ? ? ? ? ? ? Dental ?no notable dental hx. ? ?  ?Pulmonary ?neg pulmonary ROS,  ?  ?Pulmonary exam normal ? ? ? ? ? ? ? Cardiovascular ?hypertension, Normal cardiovascular exam ? ? ?  ?Neuro/Psych ?  ? GI/Hepatic ?negative GI ROS, Neg liver ROS,   ?Endo/Other  ? ? Renal/GU ?  ? ?  ?Musculoskeletal ? ? Abdominal ?  ?Peds ? Hematology ?  ?Anesthesia Other Findings ? ? Reproductive/Obstetrics ?(+) Pregnancy ? ?  ? ? ? ? ? ? ? ? ? ? ? ? ? ?  ?  ? ? ? ? ? ? ? ? ?Anesthesia Physical ?Anesthesia Plan ? ?ASA: 2 ? ?Anesthesia Plan: Epidural  ? ?Post-op Pain Management:   ? ?Induction:  ? ?PONV Risk Score and Plan: 0 ? ?Airway Management Planned:  ? ?Additional Equipment: None ? ?Intra-op Plan:  ? ?Post-operative Plan:  ? ?Informed Consent: I have reviewed the patients History and Physical, chart, labs and discussed the procedure including the risks, benefits and alternatives for the proposed anesthesia with the patient or authorized representative who has indicated his/her understanding and acceptance.  ? ? ? ? ? ?Plan Discussed with:  ? ?Anesthesia Plan Comments: (Lab Results ?     Component                Value               Date                 ?     WBC                      13.1 (H)            07/15/2021           ?     HGB                      12.3                07/15/2021           ?     HCT                      35.6 (L)            07/15/2021           ?     MCV                      90.6                07/15/2021           ?     PLT                      288                 07/15/2021           ?)  ? ? ? ? ? ? ?Anesthesia Quick Evaluation ? ?

## 2021-07-16 NOTE — Anesthesia Procedure Notes (Signed)
Epidural ?Patient location during procedure: OB ?Start time: 07/16/2021 5:48 AM ?End time: 07/16/2021 5:53 AM ? ?Staffing ?Anesthesiologist: Shelton Silvas, MD ?Performed: anesthesiologist  ? ?Preanesthetic Checklist ?Completed: patient identified, IV checked, site marked, risks and benefits discussed, surgical consent, monitors and equipment checked, pre-op evaluation and timeout performed ? ?Epidural ?Patient position: sitting ?Prep: DuraPrep ?Patient monitoring: heart rate, continuous pulse ox and blood pressure ?Approach: midline ?Location: L3-L4 ?Injection technique: LOR saline ? ?Needle:  ?Needle type: Tuohy  ?Needle gauge: 17 G ?Needle length: 9 cm ?Catheter type: closed end flexible ?Catheter size: 20 Guage ?Test dose: negative and 1.5% lidocaine ? ?Assessment ?Events: blood not aspirated, injection not painful, no injection resistance and no paresthesia ? ?Additional Notes ?LOR @ 5.5 ? ?Patient identified. Risks/Benefits/Options discussed with patient including but not limited to bleeding, infection, nerve damage, paralysis, failed block, incomplete pain control, headache, blood pressure changes, nausea, vomiting, reactions to medications, itching and postpartum back pain. Confirmed with bedside nurse the patient's most recent platelet count. Confirmed with patient that they are not currently taking any anticoagulation, have any bleeding history or any family history of bleeding disorders. Patient expressed understanding and wished to proceed. All questions were answered. Sterile technique was used throughout the entire procedure. Please see nursing notes for vital signs. Test dose was given through epidural catheter and negative prior to continuing to dose epidural or start infusion. Warning signs of high block given to the patient including shortness of breath, tingling/numbness in hands, complete motor block, or any concerning symptoms with instructions to call for help. Patient was given instructions on  fall risk and not to get out of bed. All questions and concerns addressed with instructions to call with any issues or inadequate analgesia.   ? ?Reason for block:procedure for pain ? ? ? ?

## 2021-07-16 NOTE — Progress Notes (Signed)
Called to bedside by RN for prolonged deceleration. Upon arrival to bedside, FSE being placed by RN and terbutaline given. Pitocin already discontinued. Ongoing IVF bolus running. Patient then positioned in hands and knees. FHT improved. SVE 7 cm per RN. Suspect deceleration related to rapid change after recent AROM. BP within normal limits. Will allow for fetal recovery over the next hour. Can consider restarting Pitocin after that time if contractions space or if not making ongoing cervical change.  ? ?Evalina Field, MD  ? ? ? ?

## 2021-07-16 NOTE — Progress Notes (Signed)
Labor Progress Note ?Evanie A Switala is a 21 y.o. G2P0010 at [redacted]w[redacted]d presented for IOL for gHTN ? ?S:  ?Patient comfortable with epidural ? ?O:  ?BP (!) 94/48   Pulse (!) 110   Temp 98.5 ?F (36.9 ?C) (Oral)   Resp 16   Ht 5\' 3"  (1.6 m)   Wt 70.4 kg   LMP 10/29/2020 (Exact Date)   BMI 27.49 kg/m?  ? ?Fetal Tracing: ? ?Baseline: 130 ?Variability: moderate ?Accels: 15x15 ?Decels: none ? ?Toco: 5-10 ? ?CVE: Dilation: 7 ?Effacement (%): 70 ?Station: -2 ?Presentation: Vertex ?Exam by:: [email or web address] RN ? ? ?A&P: 21 y.o. G2P0010 [redacted]w[redacted]d IOL gHTN ?#Labor: FHR Cat 1 x2 hours now, will restart pitocin 2x2 ?#Pain: epidural ?#FWB: Cat 1 ?#GBS negative ? ?[redacted]w[redacted]d, CNM ?9:41 AM ? ?

## 2021-07-16 NOTE — Progress Notes (Signed)
Labor Progress Note ?Angela Calderon is a 21 y.o. G2P0010 at [redacted]w[redacted]d presented for IOL for gHTN ? ?S:  ?Patient feeling pressure, epidural redosed approximately 30 minutes ago due to pain ? ?O:  ?BP 128/81   Pulse (!) 112   Temp 98.5 ?F (36.9 ?C)   Resp 16   Ht 5\' 3"  (1.6 m)   Wt 70.4 kg   LMP 10/29/2020 (Exact Date)   SpO2 99%   BMI 27.49 kg/m?  ? ?Fetal Tracing: ? ?Baseline: 150 ?Variability: moderate ?Accels: none ?Decels: variable with contractions ? ?Toco: 1-3 ? ? ?CVE: Dilation: Lip/rim ?Effacement (%): 90 ?Station: -1 ?Presentation: Vertex ?Exam by:: [email or web address] CNM ? ? ?A&P: 21 y.o. G2P0010 [redacted]w[redacted]d IOL gHTN ?#Labor: Progressing well. Amnioinfusion started, position changed. Will continue to labor until complete ?#Pain: epidural ?#FWB: Cat 2 due to variables ?#GBS negative ? ?[redacted]w[redacted]d, CNM ?7:07 PM ? ?

## 2021-07-16 NOTE — Discharge Summary (Signed)
? ?  Postpartum Discharge Summary ? ? ?   ?Patient Name: Angela Calderon ?DOB: May 23, 2000 ?MRN: [id number] ? ?Date of admission: 07/15/2021 ?Delivery date:07/16/2021  ?Delivering provider: Wende Mott  ?Date of discharge: 07/18/2021 ? ?Admitting diagnosis: Gestational hypertension [O13.9] ?Intrauterine pregnancy: [redacted]w[redacted]d    ?Secondary diagnosis:  Principal Problem: ?  Gestational hypertension ? ?Additional problems: depression    ?Discharge diagnosis: Term Pregnancy Delivered                                              ?Post partum procedures: None ?Augmentation: AROM, Pitocin, Cytotec, and IP Foley ?Complications: None ? ?Hospital course: Induction of Labor With Vaginal Delivery   ?21y.o. yo G2P0010 at 321w1das admitted to the hospital 07/15/2021 for induction of labor.  Indication for induction: Gestational hypertension.  Patient had an uncomplicated labor course as follows: ?Membrane Rupture Time/Date: 4:49 AM ,07/16/2021   ?Delivery Method:Vaginal, Spontaneous  ?Episiotomy: None  ?Lacerations:  None  ?Details of delivery can be found in separate delivery note.  Patient had a routine postpartum course. Patient is discharged home 07/18/21. ? ?Newborn Data: ?Birth date:07/16/2021  ?Birth time:8:00 PM  ?Gender:Female  ?Living status:Living  ?Apgars:7 ,9  ?Weight:2560 g  ? ?Magnesium Sulfate received: No ?BMZ received: No ?Rhophylac:No (baby O neg) ?MMR:N/A ?T-DaP: declined ?Flu: No ?Transfusion:No ? ?Physical exam  ?Vitals:  ? 07/17/21 0300 07/17/21 0725 07/17/21 2150 07/18/21 [id number]?BP: 112/74 118/75 119/83 128/78  ?Pulse: 63 79 (!) 54 77  ?Resp: _0 ?Temp: 98.7 ?F (37.1 ?C) 98.5 ?F (36.9 ?C) 97.7 ?F (36.5 ?C) 98 ?F (36.7 ?C)  ?TempSrc: Oral Oral Oral Oral  ?SpO2: 98%  99% 99%  ?Weight:      ?Height:      ? ?General: alert ?Lochia: appropriate ?Uterine Fundus: firm ?Incision: N/A ?DVT Evaluation: No evidence of DVT seen on physical exam. ?Labs: ?Lab Results  ?Component Value Date  ? WBC 19.9 (H) 07/17/2021  ?  HGB 9.2 (L) 07/17/2021  ? HCT 26.2 (L) 07/17/2021  ? MCV 89.7 07/17/2021  ? PLT 195 07/17/2021  ? ? ?  Latest Ref Rng & Units 07/15/2021  ? 11:07 AM  ?CMP  ?Glucose 70 - 99 mg/dL 124    ?BUN 6 - 20 mg/dL 5    ?Creatinine 0.44 - 1.00 mg/dL 0.74    ?Sodium 135 - 145 mmol/L 137    ?Potassium 3.5 - 5.1 mmol/L 4.4    ?Chloride 98 - 111 mmol/L 110    ?CO2 22 - 32 mmol/L 18    ?Calcium 8.9 - 10.3 mg/dL 8.9    ?Total Protein 6.5 - 8.1 g/dL 5.5    ?Total Bilirubin 0.3 - 1.2 mg/dL 0.6    ?Alkaline Phos 38 - 126 U/L 130    ?AST 15 - 41 U/L 19    ?ALT 0 - 44 U/L 10    ? ?Edinburgh Score: ?   ? View : No data to display.  ?  ?  ?  ? ? ? ?After visit meds:  ?Allergies as of 07/18/2021   ? ?   Reactions  ? Latex Itching  ? ?  ? ?  ?Medication List  ?  ? ?STOP taking these medications   ? ?promethazine 25 MG tablet ?Commonly known as: PHENERGAN ?  ? ?  ? ?TAKE  these medications   ? ?acetaminophen 325 MG tablet ?Commonly known as: Tylenol ?Take 2 tablets (650 mg total) by mouth every 4 (four) hours as needed (for pain scale < 4). ?  ?Blood Pressure Monitor Devi ?Please check blood pressure once a week. ?  ?busPIRone 5 MG tablet ?Commonly known as: BUSPAR ?Take 1-2 tablets (5-10 mg total) by mouth 3 (three) times daily as needed. ?  ?furosemide 20 MG tablet ?Commonly known as: LASIX ?Take 1 tablet (20 mg total) by mouth daily for 4 days. ?  ?ibuprofen 600 MG tablet ?Commonly known as: ADVIL ?Take 1 tablet (600 mg total) by mouth every 6 (six) hours. ?  ?prenatal multivitamin Tabs tablet ?Take 1 tablet by mouth daily at 12 noon. ?  ?sertraline 50 MG tablet ?Commonly known as: Zoloft ?Take 1 tablet (50 mg total) by mouth daily. ?  ? ?  ? ? ? ?Discharge home in stable condition ?Infant Feeding: Bottle ?Infant Disposition:home with mother ?Discharge instruction: per After Visit Summary and Postpartum booklet. ?Activity: Advance as tolerated. Pelvic rest for 6 weeks.  ?Diet: routine diet ?Future Appointments:No future appointments. ?Follow up  Visit: ?Message sent to [facility] by Dr. Cy Blamer on 4/8 ? ?Please schedule this patient for a In person postpartum visit in 4 weeks with the following provider: Any provider. ?Additional Postpartum F/U:BP check 1 week and mood check ?Low risk pregnancy complicated by: HTN ?Delivery mode:  Vaginal, Spontaneous  ?Anticipated Birth Control:  Unsure, would like to discuss at [facility] visit ? ? ?Renard Matter, MD, MPH ?OB Fellow, Faculty Practice ? ? ? ? ?

## 2021-07-16 NOTE — Progress Notes (Signed)
Labor Progress Note ?Angela Calderon is a 21 y.o. G2P0010 at [redacted]w[redacted]d presented for IOL for gHTN ? ?S:  ?Patient resting comfortably ? ?O:  ?BP 119/69   Pulse 66   Temp 98.2 ?F (36.8 ?C)   Resp 16   Ht 5\' 3"  (1.6 m)   Wt 70.4 kg   LMP 10/29/2020 (Exact Date)   BMI 27.49 kg/m?  ? ?Fetal Tracing: ? ?Baseline: 125 ?Variability: moderate ?Accels: 15x15 ?Decels: none ? ?Toco: 1-2 ?MVUs between 180-210 ? ? ?CVE: Dilation: 6.5 ?Effacement (%): 90 ?Station: -2 ?Presentation: Vertex ?Exam by:: C Eve Rey CNM ? ? ?A&P: 21 y.o. G2P0010 [redacted]w[redacted]d IOL gHTN ?#Labor: Progressing well. Has now been adequate for 2 hours and making cervical change. Will continue to titrate pitocin as needed ?#Pain: epidural ?#FWB: Cat 1 ?#GBS negative ? ? ?[redacted]w[redacted]d, CNM ?4:38 PM ? ?

## 2021-07-16 NOTE — Progress Notes (Signed)
Labor Progress Note ?Audrey A Reifschneider is a 21 y.o. G2P0010 at [redacted]w[redacted]d presented for IOL for gHTN ? ?S:  ?Patient resting with peanut ball ? ?O:  ?BP 101/78   Pulse 89   Temp 98.5 ?F (36.9 ?C) (Oral)   Resp 16   Ht 5\' 3"  (1.6 m)   Wt 70.4 kg   LMP 10/29/2020 (Exact Date)   BMI 27.49 kg/m?  ? ?Fetal Tracing: ? ?Baseline: 140 ?Variability: moderate ?Accels: 15x15 ?Decels: variable occasionally ? ?Toco: 2-7 ? ? ?CVE: Dilation: 5.5 ?Effacement (%): 70 ?Station: -2 ?Presentation: Vertex ?Exam by:: [email or web address] CNM ? ? ?A&P: 21 y.o. G2P0010 [redacted]w[redacted]d IOL gHTN ?#Labor: Discussed with patient placement of IUPC to better monitor contractions. Patient agreeable to plan of care. IUPC placed without difficulty. Will titrate pitocin ?#Pain: epidural ?#FWB: Cat 1 ?#GBS negative ? ?[redacted]w[redacted]d, CNM ?12:17 PM ? ?

## 2021-07-16 NOTE — Progress Notes (Signed)
Angela Calderon is a 21 y.o. G2P0010 at [redacted]w[redacted]d by LMP admitted for induction of labor due to [facility]. ? ?Subjective: ?Patient was lying in bed with FOB at bedside. Comfortable with contractions. Discussed AROM and patient would like to proceed with AROM.  ? ?Objective: ?BP 100/63   Pulse 67   Temp 97.8 ?F (36.6 ?C) (Oral)   Resp 16   Ht 5\' 3"  (1.6 m)   Wt 70.4 kg   LMP 10/29/2020 (Exact Date)   BMI 27.49 kg/m?  ?No intake/output data recorded. ?No intake/output data recorded. ? ?FHT:  FHR: 130 bpm, variability: minimal to moderate,  accelerations:  Present,  decelerations:  Occasional variables ?UC:   irregular, every 2-4 minutes ?SVE:   Dilation: 4.5 ?Effacement (%): 70 ?Station: -3 ?Exam by:: Dr. [email or web address] ? ?Labs: ?Lab Results  ?Component Value Date  ? WBC 13.1 (H) 07/15/2021  ? HGB 12.3 07/15/2021  ? HCT 35.6 (L) 07/15/2021  ? MCV 90.6 07/15/2021  ? PLT 288 07/15/2021  ? ? ?Assessment / Plan: ?Induction of labor due to gHTN progressing well. ? ?Labor:  Latent labor, progressing. S/p cytotec x2 and FB. Pit at 14.  AROM at 0449 with clear fluid. Plan to continue increasing pit until regular contraction pattern established. Consider IUPC if not making change at next check.  ?Fetal Wellbeing:  Category II, continue position changes/monitoring ?Pain Control:   Medications PRN, considering epidural ?I/D:   GBS negative ?Anticipated MOD:  NSVD ? ?09/14/2021 ?07/16/2021, 4:57 AM ? ?

## 2021-07-17 LAB — CBC
HCT: 26.2 % — ABNORMAL LOW (ref 36.0–46.0)
Hemoglobin: 9.2 g/dL — ABNORMAL LOW (ref 12.0–15.0)
MCH: 31.5 pg (ref 26.0–34.0)
MCHC: 35.1 g/dL (ref 30.0–36.0)
MCV: 89.7 fL (ref 80.0–100.0)
Platelets: 195 10*3/uL (ref 150–400)
RBC: 2.92 MIL/uL — ABNORMAL LOW (ref 3.87–5.11)
RDW: 13.2 % (ref 11.5–15.5)
WBC: 19.9 10*3/uL — ABNORMAL HIGH (ref 4.0–10.5)
nRBC: 0 % (ref 0.0–0.2)

## 2021-07-17 MED ORDER — EPINEPHRINE PF 1 MG/ML IJ SOLN
0.3000 mg | Freq: Once | INTRAMUSCULAR | Status: DC | PRN
  Filled 2021-07-17: qty 1

## 2021-07-17 MED ORDER — METHYLPREDNISOLONE SODIUM SUCC 125 MG IJ SOLR: 125.0000 mg | Freq: Once | INTRAMUSCULAR | Status: DC | PRN

## 2021-07-17 MED ORDER — SODIUM CHLORIDE 0.9 % IV BOLUS: 500.0000 mL | Freq: Once | INTRAVENOUS | Status: DC | PRN

## 2021-07-17 MED ORDER — DIPHENHYDRAMINE HCL 50 MG/ML IJ SOLN: 25.0000 mg | Freq: Once | INTRAMUSCULAR | Status: DC | PRN

## 2021-07-17 MED ORDER — SODIUM CHLORIDE 0.9 % IV SOLN: INTRAVENOUS | Status: DC | PRN

## 2021-07-17 MED ORDER — ALBUTEROL SULFATE (2.5 MG/3ML) 0.083% IN NEBU: 2.5000 mg | INHALATION_SOLUTION | Freq: Once | RESPIRATORY_TRACT | Status: DC | PRN

## 2021-07-17 MED ORDER — IRON SUCROSE 20 MG/ML IV SOLN
500.0000 mg | Freq: Once | INTRAVENOUS | Status: AC
  Administered 2021-07-17: 500 mg via INTRAVENOUS
  Filled 2021-07-17: qty 25

## 2021-07-17 NOTE — Progress Notes (Signed)
Angela Calderon was referred for history of depression/anxiety. ?* Referral screened out by Clinical Social Worker because none of the following criteria appear to apply: ?~ History of anxiety/depression during this pregnancy, or of post-partum depression following prior delivery. ?~ Diagnosis of anxiety and/or depression within last 3 years ?OR ?* Angela Calderon's symptoms currently being treated with medication and/or therapy. Angela Calderon has an active Rx for Buspar and Zoloft.  ? ?Please contact the Clinical Social Worker if needs arise, by [facility] request, or if Angela Calderon scores greater than 9/yes to question 10 on Edinburgh Postpartum Depression Screen.  ? ?Laurey Arrow, MSW, LCSW ?Clinical Social Work ?[phone] ? ?

## 2021-07-17 NOTE — Progress Notes (Signed)
Post Partum Day 1 ?Subjective: ?no complaints, up ad lib, voiding and tolerating PO, small lochia, plans to bottle feed, no method ? ?Objective: ?Blood pressure 112/74, pulse 63, temperature 98.7 ?F (37.1 ?C), temperature source Oral, resp. rate 16, height 5\' 3"  (1.6 m), weight 70.4 kg, last menstrual period 10/29/2020, SpO2 98 %, unknown if currently breastfeeding. ? ?Physical Exam:  ?General: alert, cooperative and no distress ?Lochia:normal flow ?Chest: CTAB ?Heart: RRR no m/r/g ?Abdomen: +BS, soft, nontender,  ?Uterine Fundus: firm ?DVT Evaluation: No evidence of DVT seen on physical exam. ?Extremities: trace edema ? ?Recent Labs  ?  07/16/21 ?2026 07/17/21 ?0450  ?HGB 11.7* 9.2*  ?HCT 34.2* 26.2*  ? ? ?Assessment/Plan: ?Plan for discharge tomorrow and Circumcision prior to discharge ?IV venofer today ? ? LOS: 2 days  ? ?Joaquim Lai Cresenzo-Dishmon ?07/17/2021, 7:45 AM  ? ?

## 2021-07-17 NOTE — Anesthesia Postprocedure Evaluation (Signed)
Anesthesia Post Note ? ?Patient: [name] ? ?Procedure(s) Performed: AN AD HOC LABOR EPIDURAL ? ?  ? ?Patient location during evaluation: Mother Baby ?Anesthesia Type: Epidural ?Level of consciousness: awake and alert ?Pain management: pain level controlled ?Vital Signs Assessment: post-procedure vital signs reviewed and stable ?Respiratory status: spontaneous breathing, nonlabored ventilation and respiratory function stable ?Cardiovascular status: stable ?Postop Assessment: no headache, no backache, epidural receding, no apparent nausea or vomiting, patient able to bend at knees, adequate PO intake and able to ambulate ?Anesthetic complications: no ? ? ?No notable events documented. ? ?Last Vitals:  ?Vitals:  ? 07/16/21 2315 07/17/21 0300  ?BP: 122/84 112/74  ?Pulse: 95 63  ?Resp: 17 16  ?Temp: 37.3 ?C 37.1 ?C  ?SpO2: 97% 98%  ?  ?Last Pain:  ?Vitals:  ? 07/17/21 0725  ?TempSrc:   ?PainSc: 0-No pain  ? ?Pain Goal:   ? ?  ?  ?  ?  ?  ?  ?  ? ?Bianca Vester Hristova ? ? ? ? ?

## 2021-07-18 ENCOUNTER — Encounter (HOSPITAL_COMMUNITY): Payer: Self-pay | Admitting: Family Medicine

## 2021-07-18 MED ORDER — ACETAMINOPHEN 325 MG PO TABS: 650.0000 mg | ORAL_TABLET | ORAL | 0 refills | Status: DC | PRN

## 2021-07-18 MED ORDER — FUROSEMIDE 20 MG PO TABS: 20.0000 mg | ORAL_TABLET | Freq: Every day | ORAL | 0 refills | Status: DC

## 2021-07-18 MED ORDER — IBUPROFEN 600 MG PO TABS: 600.0000 mg | ORAL_TABLET | Freq: Four times a day (QID) | ORAL | 0 refills | Status: DC

## 2021-07-18 MED ORDER — PRENATAL MULTIVITAMIN CH: 1.0000 | ORAL_TABLET | Freq: Every day | ORAL | 0 refills | Status: DC

## 2021-07-18 MED ORDER — FUROSEMIDE 20 MG PO TABS
20.0000 mg | ORAL_TABLET | Freq: Every day | ORAL | Status: DC
  Administered 2021-07-18: 20 mg via ORAL
  Filled 2021-07-18: qty 1

## 2021-07-20 ENCOUNTER — Telehealth: Payer: Self-pay

## 2021-07-20 NOTE — Telephone Encounter (Signed)
Transition Care Management Unsuccessful Follow-up Telephone Call ? ?Date of discharge and from where:  07/18/2021-[facility]  ? ?Attempts:  1st Attempt ? ?Reason for unsuccessful TCM follow-up call:  Left voice message ? ?  ?

## 2021-07-21 NOTE — Telephone Encounter (Signed)
Transition Care Management Follow-up Telephone Call ?Date of discharge and from where: 07/18/2021-[facility]  ?How have you been since you were released from the hospital? Pt stated she is doing fine.  ?Any questions or concerns? No ? ?Items Reviewed: ?Did the pt receive and understand the discharge instructions provided? Yes  ?Medications obtained and verified? Yes  ?Other? No  ?Any new allergies since your discharge? No  ?Dietary orders reviewed? No ?Do you have support at home? Yes  ? ?Home Care and Equipment/Supplies: ?Were home health services ordered? not applicable ?If so, what is the name of the agency? N/A  ?Has the agency set up a time to come to the patient's home? not applicable ?Were any new equipment or medical supplies ordered?  No ?What is the name of the medical supply agency? N/A ?Were you able to get the supplies/equipment? not applicable ?Do you have any questions related to the use of the equipment or supplies? No ? ?Functional Questionnaire: (I = Independent and D = Dependent) ?ADLs: I ? ?Bathing/Dressing- I ? ?Meal Prep- I ? ?Eating- I ? ?Maintaining continence- I ? ?Transferring/Ambulation- I ? ?Managing Meds- I ? ?Follow up appointments reviewed: ? ?PCP Hospital f/u appt confirmed? No   ?Specialist Hospital f/u appt confirmed? Yes  Scheduled to see OBGYN on 07/23/2021. ?Are transportation arrangements needed? No  ?If their condition worsens, is the pt aware to call PCP or go to the Emergency Dept.? Yes ?Was the patient provided with contact information for the PCP's office or ED? Yes ?Was to pt encouraged to call back with questions or concerns? Yes  ?

## 2021-07-23 ENCOUNTER — Ambulatory Visit: Payer: Medicaid Other

## 2021-07-28 ENCOUNTER — Telehealth (HOSPITAL_COMMUNITY): Payer: Self-pay

## 2021-07-28 NOTE — Telephone Encounter (Signed)
No answer. Left message to return nurse call. ? Heron Nay ?07/28/2021,1332 ?

## 2021-08-19 ENCOUNTER — Telehealth: Payer: Self-pay

## 2021-08-19 ENCOUNTER — Encounter: Payer: Self-pay | Admitting: Obstetrics and Gynecology

## 2021-08-19 NOTE — Telephone Encounter (Signed)
S/w patient regarding mychart message and bleeding. Pt reports that she has some small clots when urinating, mild cramping and is changing pad every 1.5-2 hours. Advised of abnormal symptoms to monitor for and report to hospital if they occur. ?

## 2021-08-31 ENCOUNTER — Ambulatory Visit (INDEPENDENT_AMBULATORY_CARE_PROVIDER_SITE_OTHER): Payer: Medicaid Other

## 2021-08-31 ENCOUNTER — Other Ambulatory Visit (HOSPITAL_COMMUNITY)
Admission: RE | Admit: 2021-08-31 | Discharge: 2021-08-31 | Disposition: A | Discharge status: Home / Self Care | Payer: Medicaid Other | Source: Ambulatory Visit

## 2021-08-31 VITALS — BP 128/84 | HR 67 | Wt 138.8 lb

## 2021-08-31 DIAGNOSIS — Z113 Encounter for screening for infections with a predominantly sexual mode of transmission: Secondary | ICD-10-CM | POA: Insufficient documentation

## 2021-08-31 DIAGNOSIS — Z3009 Encounter for other general counseling and advice on contraception: Secondary | ICD-10-CM

## 2021-08-31 LAB — POCT URINE PREGNANCY: Preg Test, Ur: NEGATIVE

## 2021-08-31 MED ORDER — NORETHIN ACE-ETH ESTRAD-FE 1-20 MG-MCG(24) PO TABS: 1.0000 | ORAL_TABLET | Freq: Every day | ORAL | 11 refills | Status: DC

## 2021-08-31 NOTE — Progress Notes (Signed)
..   Post Partum Visit Note  Angela Calderon is a 21 y.o. G54P1011 female who presents for a postpartum visit. She is 6 weeks postpartum following a normal spontaneous vaginal delivery.  I have fully reviewed the prenatal and intrapartum course. The delivery was at 37.1 gestational weeks.  Anesthesia: epidural. Postpartum course has been good. Baby is doing well. Baby is feeding by bottle - [employer] Start . Bleeding no bleeding. Bowel function is normal. Bladder function is normal. Patient is sexually active. Contraception method is none. Postpartum depression screening: negative.   The pregnancy intention screening data noted above was reviewed. Potential methods of contraception were discussed. The patient elected to proceed with No data recorded.   Edinburgh Postnatal Depression Scale - 08/31/21 0932       Edinburgh Postnatal Depression Scale:  In the Past 7 Days   I have been able to laugh and see the funny side of things. 0    I have looked forward with enjoyment to things. 0    I have blamed myself unnecessarily when things went wrong. 0    I have been anxious or worried for no good reason. 0    I have felt scared or panicky for no good reason. 0    Things have been getting on top of me. 0    I have been so unhappy that I have had difficulty sleeping. 0    I have felt sad or miserable. 0    I have been so unhappy that I have been crying. 0    The thought of harming myself has occurred to me. 0    Edinburgh Postnatal Depression Scale Total 0            Health Maintenance Due  Topic Date Due   COVID-19 Vaccine (1) Never done   HPV VACCINES (1 - 2-dose series) Never done   Hepatitis C Screening  Never done   TETANUS/TDAP  Never done    The following portions of the patient's history were reviewed and updated as appropriate: allergies, current medications, past family history, past medical history, past social history, past surgical history, and problem list.  Review  of Systems Pertinent items are noted in HPI.  Objective:  BP 128/84   Pulse 67   Wt 138 lb 12.8 oz (63 kg)   Breastfeeding No   BMI 24.59 kg/m    General:  alert, cooperative, and no distress   Breasts:  not indicated  Lungs: Effort normal  Heart:  Regular rate  Abdomen: soft, non-tender; bowel sounds normal; no masses,  no organomegaly   Wound N/a  GU exam:  not indicated       Assessment:    1. Screening for STD (sexually transmitted disease) - Patient requesting STD screening as she has recently resumed unprotected intercourse as of mid last week and that her partner has had multiple partners  - Cervicovaginal ancillary only( [facility]ail)  2. Birth control counseling - Resumed intercourse mid last week and has been using pull out method. She highly desires OCP's today. She has used in the past previously and did well. She reports she was switched to POPs for high blood pressure. I reviewed blood pressures and all have been normotensive with the exception of diagnosis of gHTN. She has no other risk factors. She declines LARC's or Depo.  - LMP was 08/16/21 - I counseled extensively on small, but theoretical risk of pregnancy given that she has resumed unprotected intercourse  and has resumed menses. She is aware that if she were to have a positive pregnancy test to stop OCP's immediately. I encouraged her to use condoms for at least 1 month after starting OCP's.  - UPT negative today  - POCT urine pregnancy - Norethindrone Acetate-Ethinyl Estrad-FE (LOESTRIN 24 FE) 1-20 MG-MCG(24) tablet; Take 1 tablet by mouth daily.  Dispense: 28 tablet; Refill: 11  3. Postpartum care and examination Normal postpartum exam.         Plan:   Essential components of care per ACOG recommendations:  1.  Mood and well being: Patient with negative depression screening today. Reviewed local resources for support.  - Patient tobacco use? No.   - hx of drug use? No.    2. Infant care and  feeding:  -Patient currently breastmilk feeding? No.  -Social determinants of health (SDOH) reviewed in EPIC. No concerns  3. Sexuality, contraception and birth spacing - Patient does not want a pregnancy in the next year.   - Reviewed reproductive life planning. Reviewed contraceptive methods based on pt preferences and effectiveness.  Patient desired Oral Contraceptive today.   - Discussed birth spacing of 18 months  4. Sleep and fatigue -Encouraged family/partner/community support of 4 hrs of uninterrupted sleep to help with mood and fatigue  5. Physical Recovery  - Discussed patients delivery and complications. She describes her labor as good. - Patient had a Vaginal, no problems at delivery. Patient had no laceration. - Patient has urinary incontinence? No. - Patient is safe to resume physical and sexual activity  6.  Health Maintenance - HM due items addressed Yes - Last pap smear  Diagnosis  Date Value Ref Range Status  11/10/2020   Final   - Negative for intraepithelial lesion or malignancy (NILM)   Pap smear not done at today's visit.  -Breast Cancer screening indicated? No.   7. Chronic Disease/Pregnancy Condition follow up: None   Brand Males, CNM [facility] [employer], [facility]

## 2021-09-01 LAB — CERVICOVAGINAL ANCILLARY ONLY
Chlamydia: NEGATIVE
Comment: NEGATIVE
Comment: NEGATIVE
Comment: NORMAL
Neisseria Gonorrhea: NEGATIVE
Trichomonas: NEGATIVE

## 2021-09-10 ENCOUNTER — Ambulatory Visit: Payer: Medicaid Other

## 2021-09-10 ENCOUNTER — Telehealth: Payer: Self-pay

## 2021-09-10 NOTE — Telephone Encounter (Signed)
  LVMM to call Office.  Need to ask Pt about Appt.

## 2021-10-08 DIAGNOSIS — L700 Acne vulgaris: Secondary | ICD-10-CM | POA: Diagnosis not present

## 2021-11-18 DIAGNOSIS — H10023 Other mucopurulent conjunctivitis, bilateral: Secondary | ICD-10-CM | POA: Diagnosis not present

## 2021-11-18 DIAGNOSIS — T3695XA Adverse effect of unspecified systemic antibiotic, initial encounter: Secondary | ICD-10-CM | POA: Diagnosis not present

## 2021-11-18 DIAGNOSIS — J020 Streptococcal pharyngitis: Secondary | ICD-10-CM | POA: Diagnosis not present

## 2021-11-18 DIAGNOSIS — Z6827 Body mass index (BMI) 27.0-27.9, adult: Secondary | ICD-10-CM | POA: Diagnosis not present

## 2021-11-18 DIAGNOSIS — Z20822 Contact with and (suspected) exposure to covid-19: Secondary | ICD-10-CM | POA: Diagnosis not present

## 2021-11-18 DIAGNOSIS — J029 Acute pharyngitis, unspecified: Secondary | ICD-10-CM | POA: Diagnosis not present

## 2021-11-18 DIAGNOSIS — R509 Fever, unspecified: Secondary | ICD-10-CM | POA: Diagnosis not present

## 2021-11-18 DIAGNOSIS — Z03818 Encounter for observation for suspected exposure to other biological agents ruled out: Secondary | ICD-10-CM | POA: Diagnosis not present

## 2021-11-18 DIAGNOSIS — B379 Candidiasis, unspecified: Secondary | ICD-10-CM | POA: Diagnosis not present

## 2021-11-24 ENCOUNTER — Other Ambulatory Visit: Payer: Self-pay | Admitting: Obstetrics and Gynecology

## 2021-11-30 ENCOUNTER — Encounter: Payer: Self-pay | Admitting: Obstetrics and Gynecology

## 2021-12-01 ENCOUNTER — Other Ambulatory Visit (HOSPITAL_BASED_OUTPATIENT_CLINIC_OR_DEPARTMENT_OTHER): Payer: Self-pay | Admitting: Advanced Practice Midwife

## 2021-12-01 DIAGNOSIS — Z3009 Encounter for other general counseling and advice on contraception: Secondary | ICD-10-CM

## 2021-12-01 MED ORDER — NORETHIN-ETH ESTRAD-FE BIPHAS 1 MG-10 MCG / 10 MCG PO TABS: 1.0000 | ORAL_TABLET | Freq: Every day | ORAL | 11 refills | Status: DC

## 2021-12-05 ENCOUNTER — Other Ambulatory Visit (HOSPITAL_BASED_OUTPATIENT_CLINIC_OR_DEPARTMENT_OTHER): Payer: Self-pay | Admitting: Advanced Practice Midwife

## 2021-12-05 DIAGNOSIS — O99345 Other mental disorders complicating the puerperium: Secondary | ICD-10-CM

## 2021-12-05 DIAGNOSIS — F419 Anxiety disorder, unspecified: Secondary | ICD-10-CM

## 2021-12-05 MED ORDER — SERTRALINE HCL 50 MG PO TABS: 50.0000 mg | ORAL_TABLET | Freq: Every day | ORAL | 11 refills | Status: DC

## 2021-12-05 NOTE — Progress Notes (Signed)
Angela Calderon who is 4 months postpartum following vaginal delivery contacted office reporting she was out of Zoloft prescribed during her pregnancy and also wants to switch to Lexapro.  Message sent to pt that Zoloft refills sent to pharmacy but pt needs to be seen in our office or PCP or behavioral health to switch medications.

## 2021-12-16 ENCOUNTER — Telehealth: Payer: Self-pay

## 2021-12-16 ENCOUNTER — Telehealth: Payer: Medicaid Other | Admitting: Advanced Practice Midwife

## 2021-12-16 DIAGNOSIS — O99345 Other mental disorders complicating the puerperium: Secondary | ICD-10-CM

## 2021-12-16 NOTE — Telephone Encounter (Signed)
Called pt to follow-up with her for her virtual visit with Sharen Counter. The pt stated she forgot about the visit and will need to reschedule. Informed pt I will let the front office know to reschedule her. No other concerns at this time.

## 2021-12-18 NOTE — Progress Notes (Signed)
Pt rescheduled

## 2022-01-11 ENCOUNTER — Telehealth: Payer: Medicaid Other | Admitting: Advanced Practice Midwife

## 2022-02-23 ENCOUNTER — Encounter: Payer: Self-pay | Admitting: Advanced Practice Midwife

## 2022-02-24 ENCOUNTER — Other Ambulatory Visit (HOSPITAL_BASED_OUTPATIENT_CLINIC_OR_DEPARTMENT_OTHER): Payer: Self-pay | Admitting: Advanced Practice Midwife

## 2022-02-24 DIAGNOSIS — F418 Other specified anxiety disorders: Secondary | ICD-10-CM

## 2022-02-24 MED ORDER — SERTRALINE HCL 25 MG PO TABS: 25.0000 mg | ORAL_TABLET | Freq: Every day | ORAL | 3 refills | Status: DC

## 2022-02-24 NOTE — Progress Notes (Signed)
Pt missed recent appt due to work schedule but contacted provider via Mychart to request she stay on her current medication, Zoloft, but increase from 50 mg to 75 mg daily.  Rx sent for 25 mg tablets to add daily for total dose 75 mg and pt to follow up in 1-2 months. Consider increase to 100 mg dose daily at that time.

## 2022-04-12 NOTE — L&D Delivery Note (Signed)
Labor Progress: ASTARIA AUTREY is a 22 y.o. female G3P1011 with IUP at [redacted]w[redacted]d admitted for IOL for GHTN .  She progressed with augmentation to complete and pushed through one contraction to deliver.  Cord clamping delayed by several minutes then clamped by CNM and cut by FOB.  Placenta intact and spontaneous, bleeding minimal.  No laceration, intact perineum.  Mom and baby stable prior to transfer to postpartum. She plans on breastfeeding. She requests vasectomy for birth control.   Delivery Note At 8:05 PM a viable and healthy female was delivered via Vaginal, Spontaneous (Presentation: Right Occiput Anterior).  APGAR: 9, 9; weight  pending.   Placenta status: Spontaneous, Intact.  Cord: 3 vessels with the following complications: None.    Anesthesia: Epidural Episiotomy: None Lacerations: None Suture Repair:  n/a Est. Blood Loss (mL): 155  Mom to postpartum.  Baby to Couplet care / Skin to Skin.  Sharen Counter 02/04/2023, 8:59 PM

## 2022-06-07 ENCOUNTER — Ambulatory Visit: Payer: Medicaid Other | Admitting: Obstetrics and Gynecology

## 2022-06-22 DIAGNOSIS — Z6828 Body mass index (BMI) 28.0-28.9, adult: Secondary | ICD-10-CM | POA: Diagnosis not present

## 2022-06-22 DIAGNOSIS — Z20822 Contact with and (suspected) exposure to covid-19: Secondary | ICD-10-CM | POA: Diagnosis not present

## 2022-06-22 DIAGNOSIS — Z789 Other specified health status: Secondary | ICD-10-CM | POA: Diagnosis not present

## 2022-06-22 DIAGNOSIS — J101 Influenza due to other identified influenza virus with other respiratory manifestations: Secondary | ICD-10-CM | POA: Diagnosis not present

## 2022-06-29 ENCOUNTER — Other Ambulatory Visit: Payer: Self-pay

## 2022-06-29 ENCOUNTER — Inpatient Hospital Stay (HOSPITAL_COMMUNITY): Payer: Medicaid Other

## 2022-06-29 ENCOUNTER — Inpatient Hospital Stay (HOSPITAL_COMMUNITY)
Admission: AD | Admit: 2022-06-29 | Discharge: 2022-06-29 | Disposition: A | Discharge status: Home / Self Care | Payer: Medicaid Other | Attending: Family Medicine | Admitting: Family Medicine

## 2022-06-29 ENCOUNTER — Encounter (HOSPITAL_COMMUNITY): Payer: Self-pay | Admitting: Family Medicine

## 2022-06-29 DIAGNOSIS — Z79899 Other long term (current) drug therapy: Secondary | ICD-10-CM | POA: Diagnosis not present

## 2022-06-29 DIAGNOSIS — M549 Dorsalgia, unspecified: Secondary | ICD-10-CM | POA: Insufficient documentation

## 2022-06-29 DIAGNOSIS — Z3A01 Less than 8 weeks gestation of pregnancy: Secondary | ICD-10-CM

## 2022-06-29 DIAGNOSIS — R1030 Lower abdominal pain, unspecified: Secondary | ICD-10-CM | POA: Insufficient documentation

## 2022-06-29 DIAGNOSIS — O26891 Other specified pregnancy related conditions, first trimester: Secondary | ICD-10-CM | POA: Diagnosis not present

## 2022-06-29 LAB — URINALYSIS, ROUTINE W REFLEX MICROSCOPIC
Bilirubin Urine: NEGATIVE
Glucose, UA: NEGATIVE mg/dL
Hgb urine dipstick: NEGATIVE
Ketones, ur: NEGATIVE mg/dL
Leukocytes,Ua: NEGATIVE
Nitrite: NEGATIVE
Protein, ur: NEGATIVE mg/dL
Specific Gravity, Urine: 1.008 (ref 1.005–1.030)
pH: 6 (ref 5.0–8.0)

## 2022-06-29 LAB — CBC
HCT: 40.2 % (ref 36.0–46.0)
Hemoglobin: 13.5 g/dL (ref 12.0–15.0)
MCH: 29.4 pg (ref 26.0–34.0)
MCHC: 33.6 g/dL (ref 30.0–36.0)
MCV: 87.6 fL (ref 80.0–100.0)
Platelets: 333 10*3/uL (ref 150–400)
RBC: 4.59 MIL/uL (ref 3.87–5.11)
RDW: 12.3 % (ref 11.5–15.5)
WBC: 10.9 10*3/uL — ABNORMAL HIGH (ref 4.0–10.5)
nRBC: 0 % (ref 0.0–0.2)

## 2022-06-29 LAB — WET PREP, GENITAL
Clue Cells Wet Prep HPF POC: NONE SEEN
Sperm: NONE SEEN
Trich, Wet Prep: NONE SEEN
WBC, Wet Prep HPF POC: <10 (ref <10)
Yeast Wet Prep HPF POC: NONE SEEN

## 2022-06-29 LAB — ABO/RH
ABO/RH(D): O NEG
Antibody Screen: NEGATIVE

## 2022-06-29 LAB — HCG, QUANTITATIVE, PREGNANCY: hCG, Beta Chain, Quant, S: 11064 m[IU]/mL — ABNORMAL HIGH (ref <5)

## 2022-06-29 LAB — POCT PREGNANCY, URINE: Preg Test, Ur: POSITIVE — AB

## 2022-06-29 NOTE — MAU Provider Note (Signed)
History     CSN: [id number]  Arrival date and time: 06/29/22 1750   Event Date/Time   First Provider Initiated Contact with Patient 06/29/22 2020      Chief Complaint  Patient presents with   Abdominal Pain   Angela Calderon , a  22 y.o. G3P1011 at Unknown presents to MAU with complaints of abdominal pain. Patient states pain started a few days ago, but states that it has gotten worse today. She describes pain as cramping  and back aches. She currently rates pain a 6/10. Denies attempting to relieve with medication. She States she was at work and took 3 pregnancy pregnancy test and they were all positive. She states her LMP was December of last year.             OB History     Gravida  3   Para  1   Term  1   Preterm  0   AB  1   Living  1      SAB  1   IAB  0   Ectopic  0   Multiple  0   Live Births  1           Past Medical History:  Diagnosis Date   Contraception management 09/05/2020   Pregnancy induced hypertension    UTI (urinary tract infection)     Past Surgical History:  Procedure Laterality Date   TONSILLECTOMY AND ADENOIDECTOMY Bilateral 09/29/2012   Procedure: TONSILLECTOMY AND ADENOIDECTOMY;  Surgeon: Ruby Cola, MD;  Location: [facility] OR;  Service: ENT;  Laterality: Bilateral;    Family History  Problem Relation Age of Onset   Cervical cancer Mother    Ovarian cancer Paternal Grandmother    Diabetes Paternal Grandfather    Congestive Heart Failure Paternal Grandfather     Social History   Tobacco Use   Smoking status: Never    Passive exposure: Yes   Smokeless tobacco: Never  Vaping Use   Vaping Use: Never used  Substance Use Topics   Alcohol use: Not Currently   Drug use: No    Allergies:  Allergies  Allergen Reactions   Latex Itching    Medications Prior to Admission  Medication Sig Dispense Refill Last Dose   acetaminophen (TYLENOL) 325 MG tablet Take 2 tablets (650 mg total) by mouth every 4 (four)  hours as needed (for pain scale < 4). (Patient not taking: Reported on 08/31/2021) 60 tablet 0    Blood Pressure Monitor DEVI Please check blood pressure once a week. (Patient not taking: Reported on 07/07/2021) 1 each 0    busPIRone (BUSPAR) 5 MG tablet Take 1-2 tablets (5-10 mg total) by mouth 3 (three) times daily as needed. 30 tablet 2    furosemide (LASIX) 20 MG tablet Take 1 tablet (20 mg total) by mouth daily for 4 days. 4 tablet 0    ibuprofen (ADVIL) 600 MG tablet Take 1 tablet (600 mg total) by mouth every 6 (six) hours. (Patient not taking: Reported on 08/31/2021) 30 tablet 0    Norethindrone-Ethinyl Estradiol-Fe Biphas (LO LOESTRIN FE) 1 MG-10 MCG / 10 MCG tablet Take 1 tablet by mouth daily. 28 tablet 11    Prenatal Vit-Fe Fumarate-FA (PRENATAL MULTIVITAMIN) TABS tablet Take 1 tablet by mouth daily at 12 noon. (Patient not taking: Reported on 08/31/2021) 60 tablet 0    sertraline (ZOLOFT) 25 MG tablet Take 1 tablet (25 mg total) by mouth daily. Add 25 mg tablet daily for  total dose 75 mg daily. 30 tablet 3    sertraline (ZOLOFT) 50 MG tablet Take 1 tablet (50 mg total) by mouth daily. 30 tablet 11     Review of Systems  Constitutional:  Negative for chills, fatigue and fever.  Eyes:  Negative for pain and visual disturbance.  Respiratory:  Negative for apnea, shortness of breath and wheezing.   Cardiovascular:  Negative for chest pain and palpitations.  Gastrointestinal:  Positive for abdominal pain. Negative for constipation, diarrhea, nausea and vomiting.  Genitourinary:  Positive for pelvic pain. Negative for difficulty urinating, dysuria, vaginal bleeding, vaginal discharge and vaginal pain.  Musculoskeletal:  Negative for back pain.  Neurological:  Negative for seizures, weakness and headaches.  Psychiatric/Behavioral:  Negative for suicidal ideas.    Physical Exam   Blood pressure 133/84, pulse 77, temperature 98.5 F (36.9 C), temperature source Oral, resp. rate 18, height 5'  3" (1.6 m), weight 75.4 kg, SpO2 100 %, not currently breastfeeding.  Physical Exam Vitals and nursing note reviewed.  Constitutional:      General: She is not in acute distress.    Appearance: Normal appearance.  HENT:     Head: Normocephalic.  Pulmonary:     Effort: Pulmonary effort is normal.  Abdominal:     Palpations: Abdomen is soft.  Musculoskeletal:     Cervical back: Normal range of motion.  Skin:    General: Skin is warm and dry.     Capillary Refill: Capillary refill takes 2 to 3 seconds.  Neurological:     Mental Status: She is alert and oriented to person, place, and time.  Psychiatric:        Mood and Affect: Mood normal.     MAU Course  Procedures Orders Placed This Encounter  Procedures   Wet prep, genital   US OB LESS THAN 14 WEEKS WITH OB TRANSVAGINAL   Urinalysis, Routine w reflex microscopic -Urine, Clean Catch   CBC   hCG, quantitative, pregnancy   Diet NPO time specified   Pregnancy, urine POC   ABO/Rh   Discharge patient   Results for orders placed or performed during the hospital encounter of 06/29/22 (from the past 24 hour(s))  Pregnancy, urine POC     Status: Abnormal   Collection Time: 06/29/22  6:49 PM  Result Value Ref Range   Preg Test, Ur POSITIVE (A) NEGATIVE  Wet prep, genital     Status: None   Collection Time: 06/29/22  7:03 PM   Specimen: PATH Cytology Cervicovaginal Ancillary Only  Result Value Ref Range   Yeast Wet Prep HPF POC NONE SEEN NONE SEEN   Trich, Wet Prep NONE SEEN NONE SEEN   Clue Cells Wet Prep HPF POC NONE SEEN NONE SEEN   WBC, Wet Prep HPF POC <10 <10   Sperm NONE SEEN   CBC     Status: Abnormal   Collection Time: 06/29/22  7:15 PM  Result Value Ref Range   WBC 10.9 (H) 4.0 - 10.5 K/uL   RBC 4.59 3.87 - 5.11 MIL/uL   Hemoglobin 13.5 12.0 - 15.0 g/dL   HCT 40.2 36.0 - 46.0 %   MCV 87.6 80.0 - 100.0 fL   MCH 29.4 26.0 - 34.0 pg   MCHC 33.6 30.0 - 36.0 g/dL   RDW 12.3 11.5 - 15.5 %   Platelets 333 150 -  400 K/uL   nRBC 0.0 0.0 - 0.2 %  ABO/Rh     Status: None  Collection Time: 06/29/22  7:15 PM  Result Value Ref Range   ABO/RH(D) O NEG    Antibody Screen      NEG Performed at [facility] [address], [place], [place] [address]   Urinalysis, Routine w reflex microscopic -Urine, Clean Catch     Status: Abnormal   Collection Time: 06/29/22  7:46 PM  Result Value Ref Range   Color, Urine STRAW (A) YELLOW   APPearance CLEAR CLEAR   Specific Gravity, Urine 1.008 1.005 - 1.030   pH 6.0 5.0 - 8.0   Glucose, UA NEGATIVE NEGATIVE mg/dL   Hgb urine dipstick NEGATIVE NEGATIVE   Bilirubin Urine NEGATIVE NEGATIVE   Ketones, ur NEGATIVE NEGATIVE mg/dL   Protein, ur NEGATIVE NEGATIVE mg/dL   Nitrite NEGATIVE NEGATIVE   Leukocytes,Ua NEGATIVE NEGATIVE   US OB LESS THAN 14 WEEKS WITH OB TRANSVAGINAL  Result Date: 06/29/2022 CLINICAL DATA:  [id number] Abdominal pain [id number] EXAM: OBSTETRIC <14 WK [place] AND TRANSVAGINAL OB US TECHNIQUE: Transvaginal ultrasound was performed for complete evaluation of the gestation as well as the maternal uterus, adnexal regions, and pelvic cul-de-sac. COMPARISON:  None Available. FINDINGS: Intrauterine gestational sac: Single Yolk sac:  Visualized. Embryo: Not present. MSD: 1.0 cm = 5 weeks 5 days [place] EDC: 02/24/2023 Subchorionic hemorrhage:  None visualized. Adnexa: No masses or fluid collections. IMPRESSION: 5 week 5 day intrauterine gestational sac. No fetal pole yet identified. Consider follow-up to determine viability, if indicated. Electronically Signed   By: Sammie Bench M.D.   On: 06/29/2022 20:11    MDM - Wet prep normal - UA straw colored but otherwise normal.  - [place] results revealed a 5w IUP without fetal pole. Recommendation is to follow up with viability [place] in 7-10 days. Patient agreeable to plan of care.  - plan for discharge.    Assessment and Plan   1. Less than [redacted] weeks gestation of pregnancy   2. Lower abdominal pain   3. [redacted] weeks gestation  of pregnancy    - Reviewed that abdominal discomfort can be a normal finding in pregnancy. - Reviewed that patient will need a [place] in 1-2 weeks. Message sent to [facility] to get patient scheduled.  - Worsening signs and return precautions reviewed. - Patient discharged home in stable condition and may return to MAU as needed.   Jacquiline Doe, MSN CNM  06/29/2022, 8:20 PM

## 2022-06-29 NOTE — MAU Note (Addendum)
Angela Calderon is a 22 y.o. at Unknown here in MAU reporting: lower abdominal pain that began 3 days ago but worsened today.  Reports last  normal menstrual cycle was in December 2023 and had spotting in February.  Had +HPT x3 today.  Denies current VB. LMP: 03/2022 Onset of complaint: 3 days ago Pain score: 7 VS: 98.5-77-18-133/84  O2 sats 100  FHT:NA Lab orders placed from triage:   UPT

## 2022-06-30 ENCOUNTER — Encounter: Payer: Self-pay | Admitting: Advanced Practice Midwife

## 2022-06-30 LAB — GC/CHLAMYDIA PROBE AMP (~~LOC~~) NOT AT ARMC
Chlamydia: POSITIVE — AB
Comment: NEGATIVE
Comment: NORMAL
Neisseria Gonorrhea: NEGATIVE

## 2022-07-01 ENCOUNTER — Telehealth (HOSPITAL_COMMUNITY): Payer: Self-pay

## 2022-07-01 MED ORDER — AZITHROMYCIN 500 MG PO TABS: 1000.0000 mg | ORAL_TABLET | Freq: Once | ORAL | 0 refills | Status: DC

## 2022-07-02 ENCOUNTER — Other Ambulatory Visit: Payer: Self-pay

## 2022-07-02 MED ORDER — AZITHROMYCIN 500 MG PO TABS: 1000.0000 mg | ORAL_TABLET | Freq: Once | ORAL | 0 refills | Status: AC

## 2022-07-08 ENCOUNTER — Inpatient Hospital Stay (HOSPITAL_COMMUNITY)
Admission: AD | Admit: 2022-07-08 | Discharge: 2022-07-08 | Disposition: A | Discharge status: Home / Self Care | Payer: Medicaid Other | Attending: Obstetrics & Gynecology | Admitting: Obstetrics & Gynecology

## 2022-07-08 ENCOUNTER — Telehealth: Payer: Self-pay

## 2022-07-08 ENCOUNTER — Inpatient Hospital Stay (HOSPITAL_COMMUNITY): Payer: Medicaid Other

## 2022-07-08 DIAGNOSIS — O26891 Other specified pregnancy related conditions, first trimester: Secondary | ICD-10-CM | POA: Insufficient documentation

## 2022-07-08 DIAGNOSIS — Z3A01 Less than 8 weeks gestation of pregnancy: Secondary | ICD-10-CM

## 2022-07-08 DIAGNOSIS — R102 Pelvic and perineal pain: Secondary | ICD-10-CM | POA: Diagnosis not present

## 2022-07-08 DIAGNOSIS — O209 Hemorrhage in early pregnancy, unspecified: Secondary | ICD-10-CM | POA: Diagnosis not present

## 2022-07-08 DIAGNOSIS — O26899 Other specified pregnancy related conditions, unspecified trimester: Secondary | ICD-10-CM

## 2022-07-08 LAB — HCG, QUANTITATIVE, PREGNANCY: hCG, Beta Chain, Quant, S: 68549 m[IU]/mL — ABNORMAL HIGH (ref <5)

## 2022-07-08 NOTE — Telephone Encounter (Signed)
Returned call, pt stated that she was seen at [facility] on 3/19 due to having cramping. She said that she was advised to have repeat u/s, which she is scheduled for on 07/13/22. Pt wants to know should she return to mau for cramping, she reports it as mild no bleeding. Advised pt to monitor symptoms, increase water, take tylenol. If bleeding occurs or pain increases be evaluated, pt agreed.

## 2022-07-08 NOTE — MAU Provider Note (Signed)
Chief Complaint: No chief complaint on file.   Event Date/Time   First Provider Initiated Contact with Patient 07/08/22 2211        SUBJECTIVE HPI: Angela Calderon is a 22 y.o. G3P1011 at Unknown by LMP who presents to maternity admissions reporting pelvic cramping.  Was seen here on 06/29/22 and [place] was only showing a gestational sac.  She was scheduled for a followup US on 4/2.  Presents wanting to know status of pregnancy due to persistent cramping. She denies vaginal bleeding, vaginal itching/burning, urinary symptoms, h/a, or fever/chills.    Abdominal Pain This is a recurrent problem. The problem occurs intermittently. The quality of the pain is described as cramping. Pertinent negatives include no fever, myalgias or vomiting. Past treatments include nothing.   RN Note: Angela Calderon is a 22 y.o. at Unknown here in MAU reporting: was here in MAU 2 weeks ago and was told it was not a viable pregnancy and was referred for a follow up ultrasound. The place told her she could not be seen until tomorrow. Is still having a lot of cramping and wants to know what is going on with the pregnancy.      Pain score: 4/10  Past Medical History:  Diagnosis Date   Contraception management 09/05/2020   Pregnancy induced hypertension    UTI (urinary tract infection)    Past Surgical History:  Procedure Laterality Date   TONSILLECTOMY AND ADENOIDECTOMY Bilateral 09/29/2012   Procedure: TONSILLECTOMY AND ADENOIDECTOMY;  Surgeon: Ruby Cola, MD;  Location: [facility] OR;  Service: ENT;  Laterality: Bilateral;   Social History   Socioeconomic History   Marital status: Single    Spouse name: Not on file   Number of children: Not on file   Years of education: Not on file   Highest education level: Not on file  Occupational History   Not on file  Tobacco Use   Smoking status: Never    Passive exposure: Yes   Smokeless tobacco: Never  Vaping Use   Vaping Use: Never used  Substance and  Sexual Activity   Alcohol use: Not Currently   Drug use: No   Sexual activity: Yes    Partners: Male  Other Topics Concern   Not on file  Social History Narrative   ** Merged History Encounter **       Social Determinants of Health   Financial Resource Strain: Not on file  Food Insecurity: Not on file  Transportation Needs: Not on file  Physical Activity: Not on file  Stress: Not on file  Social Connections: Not on file  Intimate Partner Violence: Not on file   No current facility-administered medications on file prior to encounter.   Current Outpatient Medications on File Prior to Encounter  Medication Sig Dispense Refill   acetaminophen (TYLENOL) 325 MG tablet Take 2 tablets (650 mg total) by mouth every 4 (four) hours as needed (for pain scale < 4). (Patient not taking: Reported on 08/31/2021) 60 tablet 0   Blood Pressure Monitor DEVI Please check blood pressure once a week. (Patient not taking: Reported on 07/07/2021) 1 each 0   busPIRone (BUSPAR) 5 MG tablet Take 1-2 tablets (5-10 mg total) by mouth 3 (three) times daily as needed. 30 tablet 2   furosemide (LASIX) 20 MG tablet Take 1 tablet (20 mg total) by mouth daily for 4 days. 4 tablet 0   ibuprofen (ADVIL) 600 MG tablet Take 1 tablet (600 mg total) by mouth every 6 (  six) hours. (Patient not taking: Reported on 08/31/2021) 30 tablet 0   Norethindrone-Ethinyl Estradiol-Fe Biphas (LO LOESTRIN FE) 1 MG-10 MCG / 10 MCG tablet Take 1 tablet by mouth daily. 28 tablet 11   Prenatal Vit-Fe Fumarate-FA (PRENATAL MULTIVITAMIN) TABS tablet Take 1 tablet by mouth daily at 12 noon. (Patient not taking: Reported on 08/31/2021) 60 tablet 0   sertraline (ZOLOFT) 25 MG tablet Take 1 tablet (25 mg total) by mouth daily. Add 25 mg tablet daily for total dose 75 mg daily. 30 tablet 3   sertraline (ZOLOFT) 50 MG tablet Take 1 tablet (50 mg total) by mouth daily. 30 tablet 11   Allergies  Allergen Reactions   Latex Itching    I have reviewed  patient's Past Medical Hx, Surgical Hx, Family Hx, Social Hx, medications and allergies.   ROS:  Review of Systems  Constitutional:  Negative for fever.  Gastrointestinal:  Positive for abdominal pain. Negative for vomiting.  Musculoskeletal:  Negative for myalgias.   Review of Systems  Other systems negative   Physical Exam  Physical Exam Constitutional:      General: She is not in acute distress.    Appearance: Normal appearance. She is not ill-appearing or toxic-appearing.  HENT:     Head: Normocephalic.  Cardiovascular:     Rate and Rhythm: Normal rate.  Pulmonary:     Effort: Pulmonary effort is normal.  Genitourinary:    Comments: Deferred in lieu of transvaginal ultrasound Musculoskeletal:        General: Normal range of motion.     Cervical back: Normal range of motion.  Skin:    General: Skin is warm and dry.  Neurological:     General: No focal deficit present.     Mental Status: She is alert.  Psychiatric:        Mood and Affect: Mood normal.    Patient Vitals for the past 24 hrs:  BP Temp Temp src Pulse Resp SpO2 Height Weight  07/08/22 2006 128/78 98.5 F (36.9 C) Oral 76 17 100 % 5\' 3"  (1.6 m) 75.8 kg   Constitutional: Well-developed, well-nourished female in no acute distress.  Cardiovascular: normal rate Respiratory: normal effort GI: Abd soft, non-tender. MS: Extremities nontender, no edema, normal ROM Neurologic: Alert and oriented x 4.  GU: Neg CVAT.  PELVIC EXAM: deferred in lieu of TV US   LAB RESULTS HCG pending  --/--/O NEG (03/19 1915)  IMAGING US OB Transvaginal  Result Date: 07/08/2022 CLINICAL DATA:  Cramping. EXAM: TRANSVAGINAL OB ULTRASOUND TECHNIQUE: Transvaginal ultrasound was performed for complete evaluation of the gestation as well as the maternal uterus, adnexal regions, and pelvic cul-de-sac. COMPARISON:  June 29, 2022 FINDINGS: Intrauterine gestational sac: Single Yolk sac:  Visualized. Embryo:  Visualized. Cardiac  Activity: Visualized. Heart Rate: 122 bpm CRL:   9.0 mm   6 w 6 d                  [place] [facility]: February 25, 2023 Subchorionic hemorrhage:  None visualized. Maternal uterus/adnexae: The bilateral ovaries are visualized and are normal in appearance. A trace amount of pelvic free fluid is seen. IMPRESSION: Single, viable intrauterine pregnancy at approximately 6 weeks and 6 days gestation by ultrasound evaluation. Electronically Signed   By: Virgina Norfolk M.D.   On: 07/08/2022 22:21     MAU Management/MDM: I have reviewed the triage vital signs and the nursing notes.   Pertinent labs & imaging results that were available during my  care of the patient were reviewed by me and considered in my medical decision making (see chart for details).      I have reviewed her medical records including past results, notes and treatments. Medical, Surgical, and family history were reviewed.  Medications and recent lab tests were reviewed  Ordered followup Ultrasound to rule out SAB   ASSESSMENT Single IUP at [redacted]w[redacted]d Pelvic cramping Live single intrauterine pregnancy  PLAN Discharge home Encouraged to seek prenatal care Cancelled [place] for 4/2 Proof of pregnancy letter sent to My Chart  Pt stable at time of discharge. Encouraged to return here if she develops worsening of symptoms, increase in pain, fever, or other concerning symptoms.    Hansel Feinstein CNM, MSN Certified Nurse-Midwife 07/08/2022  10:11 PM

## 2022-07-08 NOTE — MAU Note (Signed)
..  Angela Calderon is a 22 y.o. at Unknown here in MAU reporting: was here in MAU 2 weeks ago and was told it was not a viable pregnancy and was referred for a follow up ultrasound. The place told her she could not be seen until tomorrow. Is still having a lot of cramping and wants to know what is going on with the pregnancy.    Pain score: 4/10 Vitals:   07/08/22 2006  BP: 128/78  Pulse: 76  Resp: 17  Temp: 98.5 F (36.9 C)  SpO2: 100%     FHT:n/a Lab orders placed from triage:  none

## 2022-07-11 ENCOUNTER — Encounter: Payer: Self-pay | Admitting: Advanced Practice Midwife

## 2022-07-13 ENCOUNTER — Other Ambulatory Visit: Payer: Medicaid Other

## 2022-07-13 ENCOUNTER — Other Ambulatory Visit: Payer: Self-pay

## 2022-07-13 DIAGNOSIS — O219 Vomiting of pregnancy, unspecified: Secondary | ICD-10-CM

## 2022-07-13 MED ORDER — PROMETHAZINE HCL 25 MG PO TABS: 25.0000 mg | ORAL_TABLET | Freq: Four times a day (QID) | ORAL | 2 refills | Status: DC | PRN

## 2022-07-13 MED ORDER — PROMETHAZINE HCL 12.5 MG RE SUPP: 12.5000 mg | Freq: Four times a day (QID) | RECTAL | 0 refills | Status: DC | PRN

## 2022-07-14 IMAGING — US US OB < 14 WEEKS - US OB TV
1 series · 15 of 28 positions shown · non-contrast
Comparison: None this pregnancy.

CLINICAL DATA: Pregnant patient with abdominal pain and bleeding in
first trimester pregnancy. Gestational age by LMP 6 weeks 2 days.

EXAM:
OBSTETRIC <14 WK US AND TRANSVAGINAL OB US
TECHNIQUE: Both transabdominal and transvaginal ultrasound examinations were
performed for complete evaluation of the gestation as well as the
maternal uterus, adnexal regions, and pelvic cul-de-sac.
Transvaginal technique was performed to assess early pregnancy.

[Series 1: us ob < 14 weeks - us ob tv · 81 acquisitions, 15 frames shown]
[im 1/81]
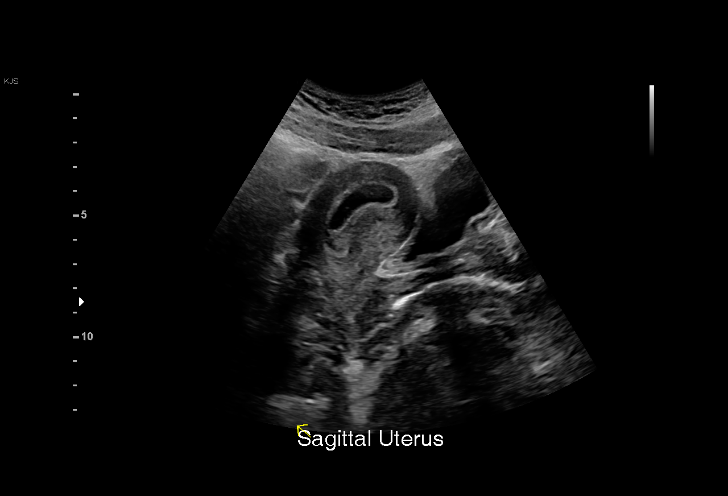
[im 6/81]
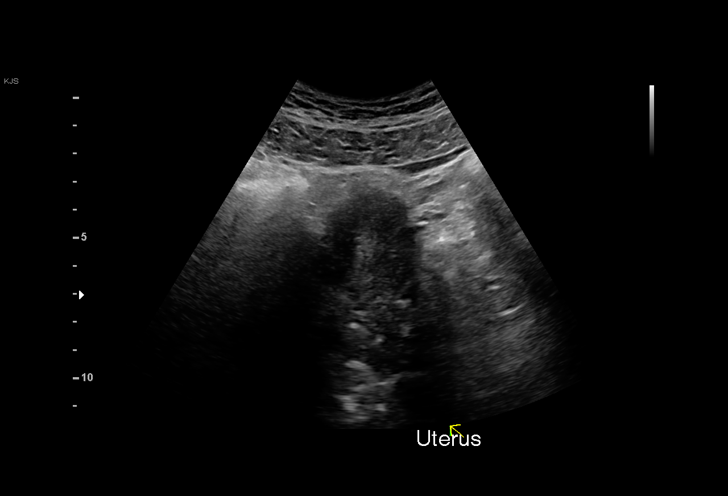
[im 12/81]
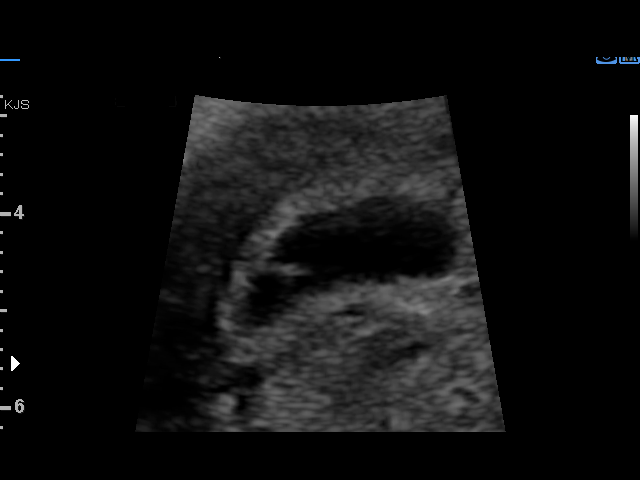
[im 18/81]
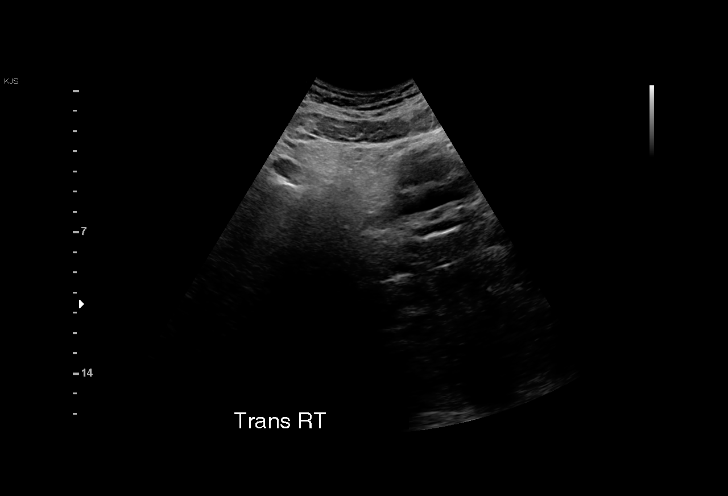
[im 24/81]
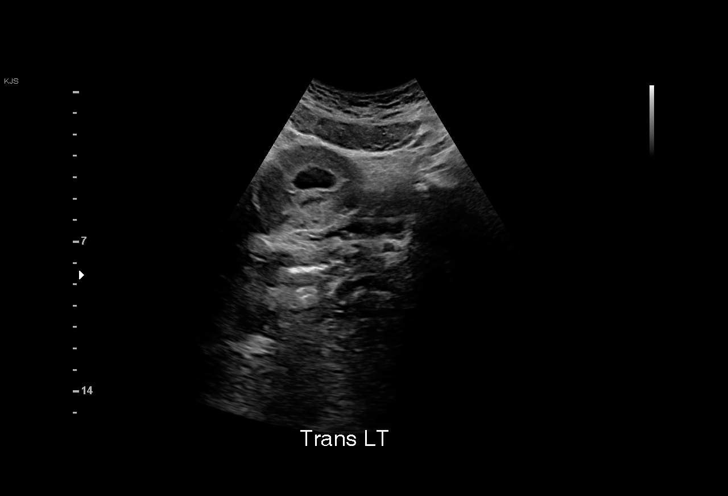
[im 30/81]
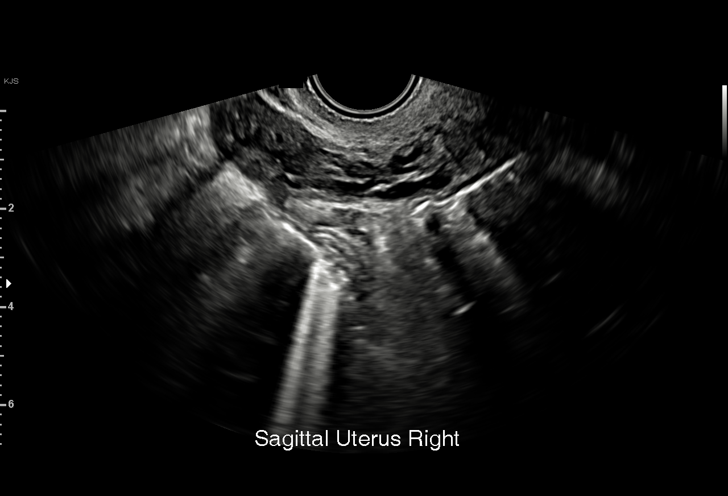
[im 36/81]
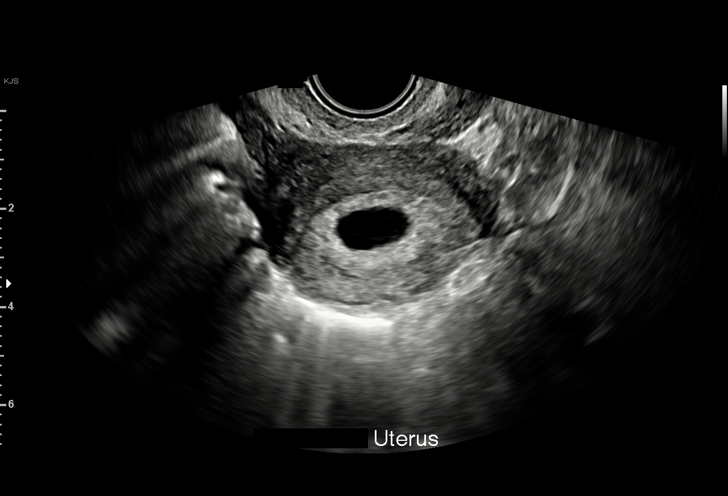
[im 42/81]
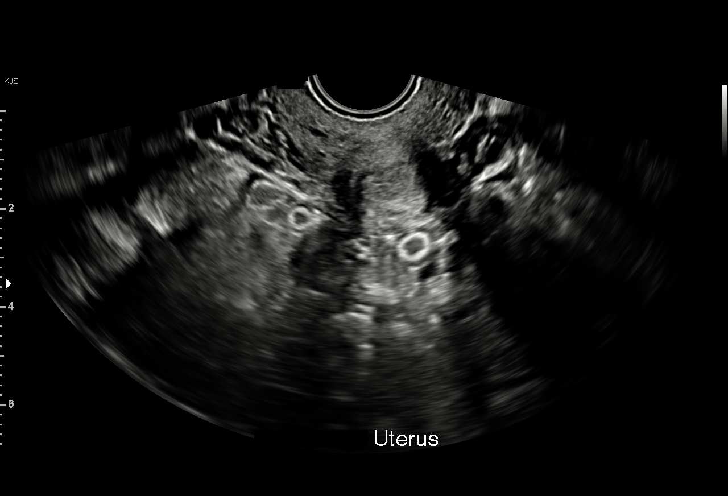
[im 45/81]
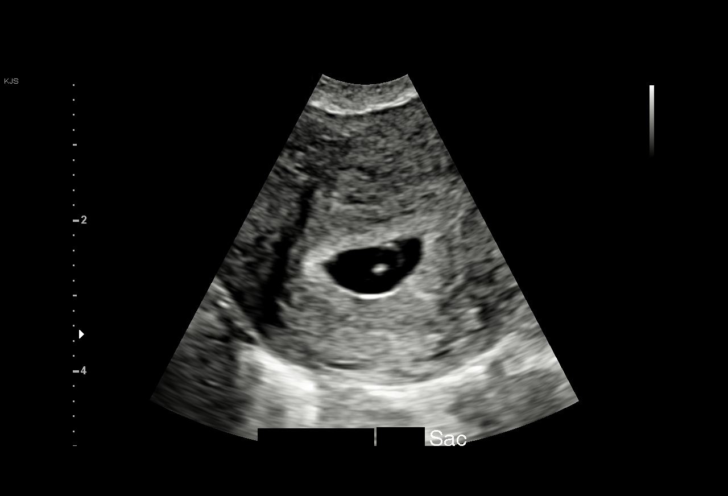
[im 51/81]
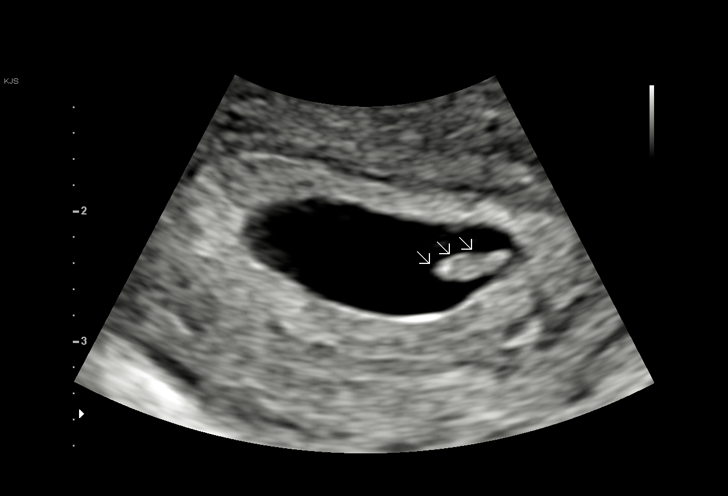
[im 57/81]
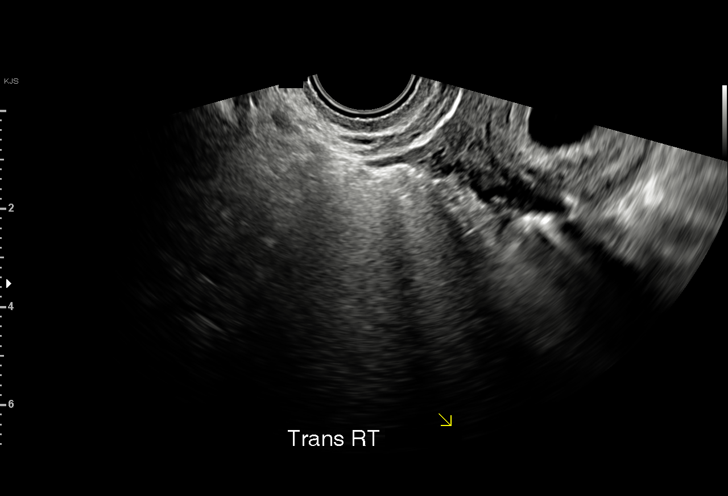
[im 63/81]
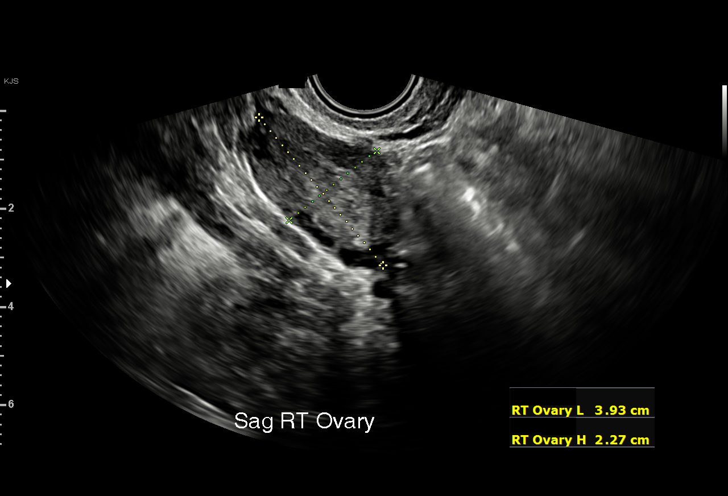
[im 69/81]
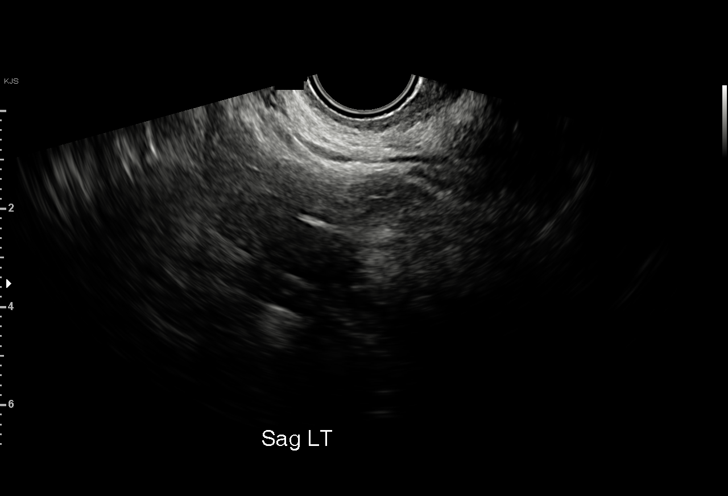
[im 75/81]
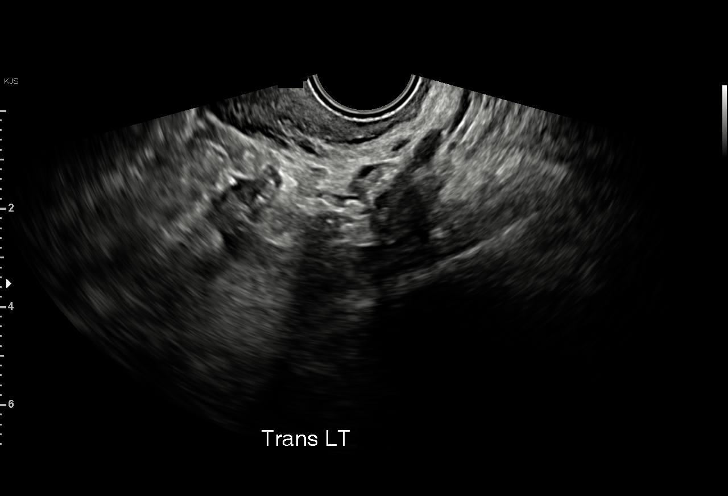
[im 81/81]
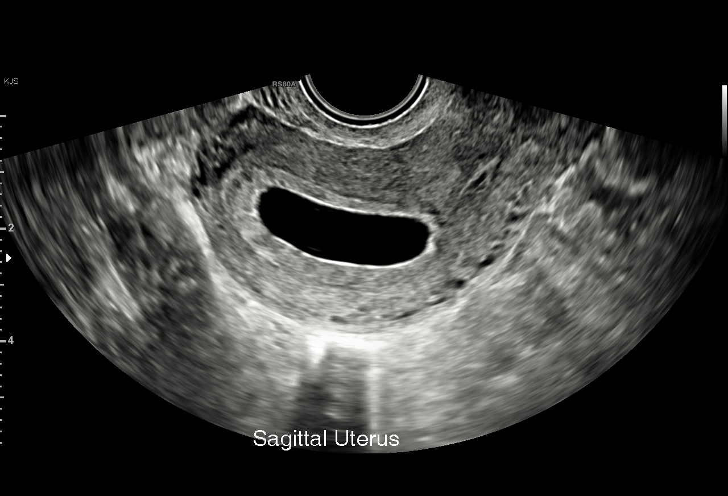

[15 of 28 positions shown; findings below may reference images not displayed]

FINDINGS: Intrauterine gestational sac: Single

Yolk sac:  Visualized.

Embryo:  Visualized.

Cardiac Activity: Visualized.

Heart Rate: 98 bpm

CRL:  6.1 mm   6 w   3 d                  US EDC: 08/04/2021

Subchorionic hemorrhage:  None visualized.

Maternal uterus/adnexae: Both ovaries are visualized and are normal
with ovarian blood flow. No adnexal mass or pelvic free fluid.
IMPRESSION: Single live intrauterine pregnancy estimated gestational age 6 weeks
3 days based on crown-rump length for ultrasound EDC 08/04/2021. No
subchorionic hemorrhage.

## 2022-07-28 ENCOUNTER — Other Ambulatory Visit: Payer: Self-pay

## 2022-07-28 ENCOUNTER — Inpatient Hospital Stay (HOSPITAL_COMMUNITY)
Admission: AD | Admit: 2022-07-28 | Discharge: 2022-07-28 | Disposition: A | Discharge status: Home / Self Care | Payer: Medicaid Other | Attending: Obstetrics and Gynecology | Admitting: Obstetrics and Gynecology

## 2022-07-28 DIAGNOSIS — O219 Vomiting of pregnancy, unspecified: Secondary | ICD-10-CM | POA: Diagnosis not present

## 2022-07-28 DIAGNOSIS — Z3A09 9 weeks gestation of pregnancy: Secondary | ICD-10-CM | POA: Insufficient documentation

## 2022-07-28 DIAGNOSIS — O4691 Antepartum hemorrhage, unspecified, first trimester: Secondary | ICD-10-CM | POA: Diagnosis not present

## 2022-07-28 DIAGNOSIS — O26891 Other specified pregnancy related conditions, first trimester: Secondary | ICD-10-CM | POA: Insufficient documentation

## 2022-07-28 DIAGNOSIS — O209 Hemorrhage in early pregnancy, unspecified: Secondary | ICD-10-CM

## 2022-07-28 DIAGNOSIS — R102 Pelvic and perineal pain: Secondary | ICD-10-CM | POA: Diagnosis not present

## 2022-07-28 LAB — CBC
HCT: 36 % (ref 36.0–46.0)
Hemoglobin: 12.3 g/dL (ref 12.0–15.0)
MCH: 29.9 pg (ref 26.0–34.0)
MCHC: 34.2 g/dL (ref 30.0–36.0)
MCV: 87.4 fL (ref 80.0–100.0)
Platelets: 280 10*3/uL (ref 150–400)
RBC: 4.12 MIL/uL (ref 3.87–5.11)
RDW: 13.3 % (ref 11.5–15.5)
WBC: 12.4 10*3/uL — ABNORMAL HIGH (ref 4.0–10.5)
nRBC: 0 % (ref 0.0–0.2)

## 2022-07-28 LAB — URINALYSIS, ROUTINE W REFLEX MICROSCOPIC
Bilirubin Urine: NEGATIVE
Glucose, UA: NEGATIVE mg/dL
Hgb urine dipstick: NEGATIVE
Ketones, ur: NEGATIVE mg/dL
Leukocytes,Ua: NEGATIVE
Nitrite: NEGATIVE
Protein, ur: NEGATIVE mg/dL
Specific Gravity, Urine: 1.024 (ref 1.005–1.030)
pH: 6 (ref 5.0–8.0)

## 2022-07-28 LAB — WET PREP, GENITAL
Clue Cells Wet Prep HPF POC: NONE SEEN
Sperm: NONE SEEN
Trich, Wet Prep: NONE SEEN
WBC, Wet Prep HPF POC: >=10 — AB (ref <10)
Yeast Wet Prep HPF POC: NONE SEEN

## 2022-07-28 MED ORDER — ONDANSETRON 4 MG PO TBDP: 4.0000 mg | ORAL_TABLET | Freq: Four times a day (QID) | ORAL | 5 refills | Status: DC | PRN

## 2022-07-28 MED ORDER — PROMETHAZINE HCL 12.5 MG RE SUPP: 12.5000 mg | Freq: Four times a day (QID) | RECTAL | 5 refills | Status: DC | PRN

## 2022-07-28 MED ORDER — RHO D IMMUNE GLOBULIN 1500 UNIT/2ML IJ SOSY
300.0000 ug | PREFILLED_SYRINGE | Freq: Once | INTRAMUSCULAR | Status: AC
  Administered 2022-07-28: 300 ug via INTRAMUSCULAR
  Filled 2022-07-28: qty 2

## 2022-07-28 MED ORDER — PROMETHAZINE HCL 12.5 MG PO TABS: 12.5000 mg | ORAL_TABLET | Freq: Four times a day (QID) | ORAL | 5 refills | Status: DC | PRN

## 2022-07-28 MED ORDER — ONDANSETRON 4 MG PO TBDP
4.0000 mg | ORAL_TABLET | Freq: Once | ORAL | Status: AC
  Administered 2022-07-28: 4 mg via ORAL
  Filled 2022-07-28: qty 1

## 2022-07-28 NOTE — MAU Note (Signed)
Angela Calderon is a 22 y.o. at [redacted]w[redacted]d here in MAU reporting: she's unable to keep anything down including liquids since Monday evening.  Reports was able to keep ginger ale down today.  Also reports having lower abdominal cramping with spotting with wiping LMP: NA Onset of complaint: Monday (N/V) Pain score: 4 Vitals:   07/28/22 1205  BP: 115/68  Pulse: 84  Resp: 18  Temp: 98.8 F (37.1 C)  SpO2: 99%     FHT:NA Lab orders placed from triage:   UA

## 2022-07-28 NOTE — MAU Provider Note (Signed)
History     [id number]  Arrival date and time: 07/28/22 1051    Chief Complaint  Patient presents with   Emesis   Nausea     HPI Angela Calderon is a 22 y.o. at [redacted]w[redacted]d by early [place] with PMHx notable for gestational hypertension in prior pregnancy, Rh negative blood type, who presents for vaginal bleeding.   Patient had viable IUP seen on [place] on 07/08/2022 Reports she started having some light spotting yesterday afternoon that has continued Also endorsing some unusual vaginal discharge Denies burning or pain with urination Also having some cramping and low back pain   --/--/O NEG (04/17 1113)  OB History     Gravida  3   Para  1   Term  1   Preterm  0   AB  1   Living  1      SAB  1   IAB  0   Ectopic  0   Multiple  0   Live Births  1           Past Medical History:  Diagnosis Date   Contraception management 09/05/2020   Pregnancy induced hypertension    UTI (urinary tract infection)     Past Surgical History:  Procedure Laterality Date   TONSILLECTOMY AND ADENOIDECTOMY Bilateral 09/29/2012   Procedure: TONSILLECTOMY AND ADENOIDECTOMY;  Surgeon: Melvenia Beam, MD;  Location: [facility] OR;  Service: ENT;  Laterality: Bilateral;    Family History  Problem Relation Age of Onset   Cervical cancer Mother    Ovarian cancer Paternal Grandmother    Diabetes Paternal Grandfather    Congestive Heart Failure Paternal Grandfather     Social History   Socioeconomic History   Marital status: Single    Spouse name: Not on file   Number of children: Not on file   Years of education: Not on file   Highest education level: Not on file  Occupational History   Not on file  Tobacco Use   Smoking status: Never    Passive exposure: Yes   Smokeless tobacco: Never  Vaping Use   Vaping Use: Never used  Substance and Sexual Activity   Alcohol use: Not Currently   Drug use: No   Sexual activity: Yes    Partners: Male  Other Topics Concern   Not on file   Social History Narrative   ** Merged History Encounter **       Social Determinants of Health   Financial Resource Strain: Not on file  Food Insecurity: Not on file  Transportation Needs: Not on file  Physical Activity: Not on file  Stress: Not on file  Social Connections: Not on file  Intimate Partner Violence: Not on file    Allergies  Allergen Reactions   Latex Itching    No current facility-administered medications on file prior to encounter.   Current Outpatient Medications on File Prior to Encounter  Medication Sig Dispense Refill   acetaminophen (TYLENOL) 325 MG tablet Take 2 tablets (650 mg total) by mouth every 4 (four) hours as needed (for pain scale < 4). (Patient not taking: Reported on 08/31/2021) 60 tablet 0   Blood Pressure Monitor DEVI Please check blood pressure once a week. (Patient not taking: Reported on 07/07/2021) 1 each 0   busPIRone (BUSPAR) 5 MG tablet Take 1-2 tablets (5-10 mg total) by mouth 3 (three) times daily as needed. 30 tablet 2   Prenatal Vit-Fe Fumarate-FA (PRENATAL MULTIVITAMIN) TABS tablet Take 1 tablet  by mouth daily at 12 noon. (Patient not taking: Reported on 08/31/2021) 60 tablet 0   sertraline (ZOLOFT) 25 MG tablet Take 1 tablet (25 mg total) by mouth daily. Add 25 mg tablet daily for total dose 75 mg daily. 30 tablet 3   sertraline (ZOLOFT) 50 MG tablet Take 1 tablet (50 mg total) by mouth daily. 30 tablet 11     ROS Pertinent positives and negative per HPI, all others reviewed and negative  Physical Exam   BP 115/68 (BP Location: Right Arm)   Pulse 84   Temp 98.8 F (37.1 C) (Oral)   Resp 18   Ht  (1.6 m)   Wt 74.8 kg   LMP  (Approximate) Comment: December 2023  SpO2 99%   BMI 29.23 kg/m   Patient Vitals for the past 24 hrs:  BP Temp Temp src Pulse Resp SpO2 Height Weight  07/28/22 1205 115/68 98.8 F (37.1 C) Oral 84 18 99 % -- --  07/28/22 1201 -- -- -- -- -- --  (1.6 m) 74.8 kg    Physical Exam Vitals  reviewed.  Constitutional:      General: She is not in acute distress.    Appearance: She is well-developed. She is not diaphoretic.  Eyes:     General: No scleral icterus. Pulmonary:     Effort: Pulmonary effort is normal. No respiratory distress.  Abdominal:     General: There is no distension.     Palpations: Abdomen is soft.     Tenderness: There is no abdominal tenderness. There is no guarding or rebound.  Skin:    General: Skin is warm and dry.  Neurological:     Mental Status: She is alert.     Coordination: Coordination normal.      Cervical Exam    Bedside Ultrasound Pt informed that the ultrasound is considered a limited OB ultrasound and is not intended to be a complete ultrasound exam.  Patient also informed that the ultrasound is not being completed with the intent of assessing for fetal or placental anomalies or any pelvic abnormalities.  Explained that the purpose of today's ultrasound is to assess for  viability.  Patient acknowledges the purpose of the exam and the limitations of the study.      My interpretation: First trimester findings: Intrauterine gestational sac seen: yes Fetal cardiac activity: 176 bpm by M mode   Labs Results for orders placed or performed during the hospital encounter of 07/28/22 (from the past 24 hour(s))  Rh IG workup (includes ABO/Rh)     Status: None (Preliminary result)   Collection Time: 07/28/22 11:13 AM  Result Value Ref Range   Gestational Age(Wks) 9    ABO/RH(D) O NEG    Antibody Screen NEG    Unit Number [id number]    Blood Component Type RHIG    Unit division 00    Status of Unit ISSUED    Transfusion Status      OK TO TRANSFUSE Performed at [facility], [address], [place], [place] [address]   CBC     Status: Abnormal   Collection Time: 07/28/22 11:13 AM  Result Value Ref Range   WBC 12.4 (H) 4.0 - 10.5 K/uL   RBC 4.12 3.87 - 5.11 MIL/uL   Hemoglobin 12.3 12.0 - 15.0 g/dL   HCT 14.7 82.9 - 56.2 %    MCV 87.4 80.0 - 100.0 fL   MCH 29.9 26.0 - 34.0 pg  MCHC 34.2 30.0 - 36.0 g/dL   RDW 16.1 09.6 - 04.5 %   Platelets 280 150 - 400 K/uL   nRBC 0.0 0.0 - 0.2 %  Urinalysis, Routine w reflex microscopic -Urine, Clean Catch     Status: Abnormal   Collection Time: 07/28/22 11:46 AM  Result Value Ref Range   Color, Urine YELLOW YELLOW   APPearance HAZY (A) CLEAR   Specific Gravity, Urine 1.024 1.005 - 1.030   pH 6.0 5.0 - 8.0   Glucose, UA NEGATIVE NEGATIVE mg/dL   Hgb urine dipstick NEGATIVE NEGATIVE   Bilirubin Urine NEGATIVE NEGATIVE   Ketones, ur NEGATIVE NEGATIVE mg/dL   Protein, ur NEGATIVE NEGATIVE mg/dL   Nitrite NEGATIVE NEGATIVE   Leukocytes,Ua NEGATIVE NEGATIVE  Wet prep, genital     Status: Abnormal   Collection Time: 07/28/22  1:32 PM  Result Value Ref Range   Yeast Wet Prep HPF POC NONE SEEN NONE SEEN   Trich, Wet Prep NONE SEEN NONE SEEN   Clue Cells Wet Prep HPF POC NONE SEEN NONE SEEN   WBC, Wet Prep HPF POC >=10 (A) <10   Sperm NONE SEEN     Imaging No results found.  MAU Course  Procedures Lab Orders         Wet prep, genital         CBC         Urinalysis, Routine w reflex microscopic -Urine, Clean Catch    Meds ordered this encounter  Medications   rho (d) immune globulin (RHIG/RHOPHYLAC) injection 300 mcg   ondansetron (ZOFRAN-ODT) disintegrating tablet 4 mg   ondansetron (ZOFRAN-ODT) 4 MG disintegrating tablet    Sig: Take 1 tablet (4 mg total) by mouth every 6 (six) hours as needed for nausea.    Dispense:  20 tablet    Refill:  5   promethazine (PHENERGAN) 12.5 MG tablet    Sig: Take 1 tablet (12.5 mg total) by mouth every 6 (six) hours as needed for nausea or vomiting.    Dispense:  30 tablet    Refill:  5   promethazine (PHENERGAN) 12.5 MG suppository    Sig: Place 1 suppository (12.5 mg total) rectally every 6 (six) hours as needed for nausea or vomiting.    Dispense:  12 each    Refill:  5   Imaging Orders  No imaging studies ordered  today    MDM Moderate (Level 3-4)  Assessment and Plan  #Vaginal bleeding in pregnancy, first trimester #Pelvic pain in pregnancy, first trimester #[redacted] weeks gestation of pregnancy US shows viable IUP with normal fetal cardiac activity. We discussed that vaginal bleeding in the first trimester is common, and that 80-90% of patients will go on to have a normal pregnancy with a live delivery. The remainder are at increased risk for miscarriage, unfortunately there are no known interventions to mitigate this risk. --/--/O NEG (04/17 1113), rhogam was indicated and was given. We discussed return precautions including crescendo abdominal pain, heavy vaginal bleeding soaking >1 pad/hour, and fever. Vaginal swab shows no infections, gc/ct pending.   #Nausea and vomiting of pregnancy Patient reports difficulty keeping down both liquids and solids. UA without significant dehydration. Given ODT zofran while here, much improved with symptoms and able to take PO challenge of water.  #FWB Normal FHR on bedside US   Dispo: discharged to home in stable condition    Venora Maples, MD/MPH 07/28/22 2:33 PM  Allergies as of 07/28/2022  Reactions   Latex Itching        Medication List     TAKE these medications    acetaminophen 325 MG tablet Commonly known as: Tylenol Take 2 tablets (650 mg total) by mouth every 4 (four) hours as needed (for pain scale < 4).   Blood Pressure Monitor Devi Please check blood pressure once a week.   busPIRone 5 MG tablet Commonly known as: BUSPAR Take 1-2 tablets (5-10 mg total) by mouth 3 (three) times daily as needed.   ondansetron 4 MG disintegrating tablet Commonly known as: ZOFRAN-ODT Take 1 tablet (4 mg total) by mouth every 6 (six) hours as needed for nausea.   prenatal multivitamin Tabs tablet Take 1 tablet by mouth daily at 12 noon.   promethazine 12.5 MG tablet Commonly known as: PHENERGAN Take 1 tablet (12.5 mg total) by mouth  every 6 (six) hours as needed for nausea or vomiting. What changed: You were already taking a medication with the same name, and this prescription was added. Make sure you understand how and when to take each.   promethazine 12.5 MG suppository Commonly known as: PHENERGAN Place 1 suppository (12.5 mg total) rectally every 6 (six) hours as needed for nausea or vomiting. What changed: Another medication with the same name was added. Make sure you understand how and when to take each.   sertraline 50 MG tablet Commonly known as: Zoloft Take 1 tablet (50 mg total) by mouth daily.   sertraline 25 MG tablet Commonly known as: Zoloft Take 1 tablet (25 mg total) by mouth daily. Add 25 mg tablet daily for total dose 75 mg daily.

## 2022-07-29 LAB — RH IG WORKUP (INCLUDES ABO/RH)
ABO/RH(D): O NEG
Antibody Screen: NEGATIVE
Gestational Age(Wks): 9
Unit division: 0

## 2022-07-29 LAB — GC/CHLAMYDIA PROBE AMP (~~LOC~~) NOT AT ARMC
Chlamydia: NEGATIVE
Comment: NEGATIVE
Comment: NORMAL
Neisseria Gonorrhea: NEGATIVE

## 2022-08-09 ENCOUNTER — Other Ambulatory Visit (HOSPITAL_COMMUNITY)
Admission: RE | Admit: 2022-08-09 | Discharge: 2022-08-09 | Disposition: A | Discharge status: Home / Self Care | Payer: Medicaid Other | Source: Ambulatory Visit | Attending: Obstetrics and Gynecology | Admitting: Obstetrics and Gynecology

## 2022-08-09 ENCOUNTER — Other Ambulatory Visit (HOSPITAL_BASED_OUTPATIENT_CLINIC_OR_DEPARTMENT_OTHER): Payer: Self-pay | Admitting: Advanced Practice Midwife

## 2022-08-09 ENCOUNTER — Encounter: Payer: Self-pay | Admitting: Advanced Practice Midwife

## 2022-08-09 ENCOUNTER — Ambulatory Visit (INDEPENDENT_AMBULATORY_CARE_PROVIDER_SITE_OTHER): Payer: Medicaid Other | Admitting: *Deleted

## 2022-08-09 VITALS — BP 123/79 | HR 71 | Wt 168.1 lb

## 2022-08-09 DIAGNOSIS — Z3481 Encounter for supervision of other normal pregnancy, first trimester: Secondary | ICD-10-CM | POA: Diagnosis not present

## 2022-08-09 DIAGNOSIS — Z3A01 Less than 8 weeks gestation of pregnancy: Secondary | ICD-10-CM

## 2022-08-09 DIAGNOSIS — Z3480 Encounter for supervision of other normal pregnancy, unspecified trimester: Secondary | ICD-10-CM | POA: Diagnosis not present

## 2022-08-09 DIAGNOSIS — O99345 Other mental disorders complicating the puerperium: Secondary | ICD-10-CM

## 2022-08-09 MED ORDER — SERTRALINE HCL 50 MG PO TABS: 100.0000 mg | ORAL_TABLET | Freq: Every day | ORAL | 11 refills | Status: DC

## 2022-08-09 NOTE — Progress Notes (Signed)
New OB Intake  I connected with Angela Calderon  on 08/09/22 at  1:10 PM EDT by In Person Visit and verified that I am speaking with the correct person using two identifiers. Nurse is located at [facility] and pt is located at [place].  I discussed the limitations, risks, security and privacy concerns of performing an evaluation and management service by telephone and the availability of in person appointments. I also discussed with the patient that there may be a patient responsible charge related to this service. The patient expressed understanding and agreed to proceed.  I explained I am completing New OB Intake today. We discussed EDD of 02/25/23 that is based on [place] on 07/08/22 at [redacted]w[redacted]d. Pt is G3/P1. I reviewed her allergies, medications, Medical/Surgical/OB history, and appropriate screenings. I informed her of [facility] services. [facility] information placed in AVS. Based on history, this is a low risk pregnancy.  Patient Active Problem List   Diagnosis Date Noted   Gestational hypertension 07/01/2021   Itching 05/15/2021   Rh negative state in antepartum period 12/13/2020   Supervision of low-risk first pregnancy, first trimester 11/25/2020   History of miscarriage 11/25/2020    Concerns addressed today  Delivery Plans Plans to deliver at [facility]. Patient given information for [facility] website for more information about [facility]. Patient is not interested in water birth. Offered upcoming OB visit with CNM to discuss further.  MyChart/Babyscripts MyChart access verified. I explained pt will have some visits in office and some virtually. Babyscripts instructions given and order placed. Patient verifies receipt of registration text/e-mail. Account successfully created and app downloaded.  Blood Pressure Cuff/Weight Scale Pt has BP cuff at home. Explained after first prenatal appt pt will check weekly and document in Babyscripts. Patient does not have weight scale;  patient may purchase if they desire to track weight weekly in Babyscripts.  Anatomy US Explained first scheduled [place] will be around 19 weeks. Anatomy US scheduled for 19 wks at [facility]. Pt notified to arrive at [facility].  Labs Discussed Avelina Laine genetic screening with patient. Would like both Panorama and Horizon drawn at new OB visit. Routine prenatal labs needed.  COVID Vaccine Patient has not had COVID vaccine.   Social Determinants of Health Food Insecurity: Patient denies food insecurity. WIC Referral: Patient is interested in referral to [facility].  Transportation: Patient denies transportation needs. Childcare: Discussed no children allowed at ultrasound appointments. Offered childcare services; patient declines childcare services at this time.  Interested in [place]? If yes, send referral and doula dot phrase.   First visit review I reviewed new OB appt with patient. I explained they will have a provider visit that includes discussion. Explained pt will be seen by Sharen Counter at first visit; encounter routed to appropriate provider. Explained that patient will be seen by pregnancy navigator following visit with provider.   Harrel Lemon, RN 08/09/2022  1:18 PM

## 2022-08-11 LAB — URINE CULTURE, OB REFLEX: Organism ID, Bacteria: NO GROWTH

## 2022-08-11 LAB — CULTURE, OB URINE

## 2022-08-12 LAB — CERVICOVAGINAL ANCILLARY ONLY
Bacterial Vaginitis (gardnerella): NEGATIVE
Candida Glabrata: NEGATIVE
Candida Vaginitis: NEGATIVE
Chlamydia: NEGATIVE
Comment: NEGATIVE
Comment: NEGATIVE
Comment: NEGATIVE
Comment: NEGATIVE
Comment: NEGATIVE
Comment: NORMAL
Neisseria Gonorrhea: NEGATIVE
Trichomonas: NEGATIVE

## 2022-08-13 ENCOUNTER — Other Ambulatory Visit: Payer: Self-pay | Admitting: Obstetrics and Gynecology

## 2022-08-13 DIAGNOSIS — Z3480 Encounter for supervision of other normal pregnancy, unspecified trimester: Secondary | ICD-10-CM

## 2022-08-13 LAB — CBC/D/PLT+RPR+RH+ABO+RUBIGG...
Basophils Absolute: 0.1 10*3/uL (ref 0.0–0.2)
Basos: 1 %
EOS (ABSOLUTE): 0.1 10*3/uL (ref 0.0–0.4)
Eos: 1 %
HCV Ab: NONREACTIVE
HIV Screen 4th Generation wRfx: NONREACTIVE
Hematocrit: 38.6 % (ref 34.0–46.6)
Hemoglobin: 12.7 g/dL (ref 11.1–15.9)
Hepatitis B Surface Ag: NEGATIVE
Immature Grans (Abs): 0 10*3/uL (ref 0.0–0.1)
Immature Granulocytes: 0 %
Lymphocytes Absolute: 1.6 10*3/uL (ref 0.7–3.1)
Lymphs: 17 %
MCH: 29.5 pg (ref 26.6–33.0)
MCHC: 32.9 g/dL (ref 31.5–35.7)
MCV: 90 fL (ref 79–97)
Monocytes Absolute: 1 10*3/uL — ABNORMAL HIGH (ref 0.1–0.9)
Monocytes: 10 %
Neutrophils Absolute: 7.1 10*3/uL — ABNORMAL HIGH (ref 1.4–7.0)
Neutrophils: 71 %
Platelets: 312 10*3/uL (ref 150–450)
RBC: 4.3 x10E6/uL (ref 3.77–5.28)
RDW: 13.2 % (ref 11.7–15.4)
RPR Ser Ql: NONREACTIVE
Rh Factor: NEGATIVE
Rubella Antibodies, IGG: 2.97 index (ref >0.99)
WBC: 9.9 10*3/uL (ref 3.4–10.8)

## 2022-08-13 LAB — COMPREHENSIVE METABOLIC PANEL
ALT: 11 IU/L (ref 0–32)
AST: 17 IU/L (ref 0–40)
Albumin/Globulin Ratio: 2 (ref 1.2–2.2)
Albumin: 4.3 g/dL (ref 4.0–5.0)
Alkaline Phosphatase: 79 IU/L (ref 44–121)
BUN/Creatinine Ratio: 9 (ref 9–23)
BUN: 7 mg/dL (ref 6–20)
Bilirubin Total: <0.2 mg/dL (ref 0.0–1.2)
CO2: 21 mmol/L (ref 20–29)
Calcium: 9.6 mg/dL (ref 8.7–10.2)
Chloride: 103 mmol/L (ref 96–106)
Creatinine, Ser: 0.78 mg/dL (ref 0.57–1.00)
Globulin, Total: 2.2 g/dL (ref 1.5–4.5)
Glucose: 73 mg/dL (ref 70–99)
Potassium: 4.4 mmol/L (ref 3.5–5.2)
Sodium: 140 mmol/L (ref 134–144)
Total Protein: 6.5 g/dL (ref 6.0–8.5)
eGFR: 110 mL/min/{1.73_m2} (ref >59)

## 2022-08-13 LAB — AB SCR+ANTIBODY ID: Antibody Screen: POSITIVE — AB

## 2022-08-13 LAB — HCV INTERPRETATION

## 2022-08-17 LAB — PANORAMA PRENATAL TEST FULL PANEL:PANORAMA TEST PLUS 5 ADDITIONAL MICRODELETIONS: FETAL FRACTION: 9.8

## 2022-08-25 ENCOUNTER — Encounter: Payer: Self-pay | Admitting: Advanced Practice Midwife

## 2022-08-25 ENCOUNTER — Ambulatory Visit (INDEPENDENT_AMBULATORY_CARE_PROVIDER_SITE_OTHER): Payer: Medicaid Other | Admitting: Advanced Practice Midwife

## 2022-08-25 VITALS — BP 125/83 | HR 77 | Wt 162.6 lb

## 2022-08-25 DIAGNOSIS — F419 Anxiety disorder, unspecified: Secondary | ICD-10-CM

## 2022-08-25 DIAGNOSIS — O219 Vomiting of pregnancy, unspecified: Secondary | ICD-10-CM

## 2022-08-25 DIAGNOSIS — Z3480 Encounter for supervision of other normal pregnancy, unspecified trimester: Secondary | ICD-10-CM

## 2022-08-25 DIAGNOSIS — Z6791 Unspecified blood type, Rh negative: Secondary | ICD-10-CM

## 2022-08-25 DIAGNOSIS — O99341 Other mental disorders complicating pregnancy, first trimester: Secondary | ICD-10-CM

## 2022-08-25 DIAGNOSIS — O26899 Other specified pregnancy related conditions, unspecified trimester: Secondary | ICD-10-CM | POA: Diagnosis not present

## 2022-08-25 DIAGNOSIS — Z3A13 13 weeks gestation of pregnancy: Secondary | ICD-10-CM

## 2022-08-25 DIAGNOSIS — Z3009 Encounter for other general counseling and advice on contraception: Secondary | ICD-10-CM

## 2022-08-25 DIAGNOSIS — Z8759 Personal history of other complications of pregnancy, childbirth and the puerperium: Secondary | ICD-10-CM

## 2022-08-25 DIAGNOSIS — Z1339 Encounter for screening examination for other mental health and behavioral disorders: Secondary | ICD-10-CM

## 2022-08-25 MED ORDER — DOXYLAMINE-PYRIDOXINE 10-10 MG PO TBEC: DELAYED_RELEASE_TABLET | ORAL | 5 refills | Status: DC

## 2022-08-25 MED ORDER — ASPIRIN 81 MG PO TBEC: 81.0000 mg | DELAYED_RELEASE_TABLET | Freq: Every day | ORAL | 5 refills | Status: DC

## 2022-08-25 NOTE — Progress Notes (Signed)
Subjective:   Angela Calderon is a 22 y.o. G3P1011 at [redacted]w[redacted]d by early ultrasound being seen today for her first obstetrical visit.  Her obstetrical history is significant for  SVD x 1, PPD and anxiety  and has Supervision of low-risk first pregnancy, first trimester; History of miscarriage; Rh negative state in antepartum period; Itching; Gestational hypertension; and Supervision of other normal pregnancy, antepartum on their problem list.. Patient does intend to breast feed. Pregnancy history fully reviewed.  Patient reports nausea and vomiting.  HISTORY: OB History  Gravida Para Term Preterm AB Living  3 1 1  0 1 1  SAB IAB Ectopic Multiple Live Births  1 0 0 0 1    # Outcome Date GA Lbr Len/2nd Weight Sex Delivery Anes PTL Lv  3 Current           2 Term 07/16/21 [redacted]w[redacted]d  5 lb 10.3 oz (2.56 kg) M Vag-Spont EPI  LIV     Name: Taplin,BOY Mellissa     Apgar1: 7  Apgar5: 9  1 SAB            Past Medical History:  Diagnosis Date   Contraception management 09/05/2020   Pregnancy induced hypertension    UTI (urinary tract infection)    Past Surgical History:  Procedure Laterality Date   TONSILLECTOMY AND ADENOIDECTOMY Bilateral 09/29/2012   Procedure: TONSILLECTOMY AND ADENOIDECTOMY;  Surgeon: Melvenia Beam, MD;  Location: [facility] OR;  Service: ENT;  Laterality: Bilateral;   Family History  Problem Relation Age of Onset   Cervical cancer Mother    Ovarian cancer Paternal Grandmother    Diabetes Paternal Grandfather    Congestive Heart Failure Paternal Grandfather    Social History   Tobacco Use   Smoking status: Never    Passive exposure: Yes   Smokeless tobacco: Never  Vaping Use   Vaping Use: Never used  Substance Use Topics   Alcohol use: Not Currently   Drug use: No   Allergies  Allergen Reactions   Latex Itching   Current Outpatient Medications on File Prior to Visit  Medication Sig Dispense Refill   acetaminophen (TYLENOL) 325 MG tablet Take 2 tablets (650 mg  total) by mouth every 4 (four) hours as needed (for pain scale < 4). 60 tablet 0   Blood Pressure Monitor DEVI Please check blood pressure once a week. 1 each 0   ondansetron (ZOFRAN-ODT) 4 MG disintegrating tablet Take 1 tablet (4 mg total) by mouth every 6 (six) hours as needed for nausea. 20 tablet 5   promethazine (PHENERGAN) 12.5 MG suppository Place 1 suppository (12.5 mg total) rectally every 6 (six) hours as needed for nausea or vomiting. 12 each 5   promethazine (PHENERGAN) 12.5 MG tablet Take 1 tablet (12.5 mg total) by mouth every 6 (six) hours as needed for nausea or vomiting. 30 tablet 5   sertraline (ZOLOFT) 50 MG tablet Take 2 tablets (100 mg total) by mouth daily. 60 tablet 11   busPIRone (BUSPAR) 5 MG tablet Take 1-2 tablets (5-10 mg total) by mouth 3 (three) times daily as needed. (Patient not taking: Reported on 08/09/2022) 30 tablet 2   Prenatal Vit-Fe Fumarate-FA (PRENATAL MULTIVITAMIN) TABS tablet Take 1 tablet by mouth daily at 12 noon. (Patient not taking: Reported on 08/31/2021) 60 tablet 0   No current facility-administered medications on file prior to visit.     Indications for ASA therapy (per uptodate) One of the following: Previous pregnancy with preeclampsia, especially  early onset and with an adverse outcome No Multifetal gestation No Chronic hypertension No Type 1 or 2 diabetes mellitus No Chronic kidney disease No Autoimmune disease (antiphospholipid syndrome, systemic lupus erythematosus) No   Two or more of the following: Nulliparity No Obesity (body mass index >30 kg/m2) No Family history of preeclampsia in mother or sister No Age ?35 years No Sociodemographic characteristics (African American race, low socioeconomic level) No Personal risk factors (eg, previous pregnancy with low birth weight or small for gestational age infant, previous adverse pregnancy outcome [eg, stillbirth], interval >10 years between pregnancies) No   Indications for early 1  hour GTT (per uptodate)  BMI >25 (>23 in Asian women) AND one of the following  Gestational diabetes mellitus in a previous pregnancy No Glycated hemoglobin ?5.7 percent (39 mmol/mol), impaired glucose tolerance, or impaired fasting glucose on previous testing No First-degree relative with diabetes No High-risk race/ethnicity (eg, African American, Latino, Native American, [place] American, Pacific Islander) No History of cardiovascular disease No Hypertension or on therapy for hypertension No High-density lipoprotein cholesterol level <35 mg/dL (1.91 mmol/L) and/or a triglyceride level >250 mg/dL (4.78 mmol/L) No Polycystic ovary syndrome No Physical inactivity No Other clinical condition associated with insulin resistance (eg, severe obesity, acanthosis nigricans) No Previous birth of an infant weighing ?4000 g No Previous stillbirth of unknown cause No Exam   Vitals:   08/25/22 0927  BP: 125/83  Pulse: 77  Weight: 162 lb 9.6 oz (73.8 kg)   Fetal Heart Rate (bpm): 147  Uterus:     Pelvic Exam: Perineum: no hemorrhoids, normal perineum   Vulva: normal external genitalia, no lesions   Vagina:  normal mucosa, normal discharge   Cervix: no lesions and normal, pap smear done.    Adnexa: normal adnexa and no mass, fullness, tenderness   Bony Pelvis: average  System: General: well-developed, well-nourished female in no acute distress   Breast:  normal appearance, no masses or tenderness   Skin: normal coloration and turgor, no rashes   Neurologic: oriented, normal, negative, normal mood   Extremities: normal strength, tone, and muscle mass, ROM of all joints is normal   HEENT PERRLA, extraocular movement intact and sclera clear, anicteric   Mouth/Teeth mucous membranes moist, pharynx normal without lesions and dental hygiene good   Neck supple and no masses   Cardiovascular: regular rate and rhythm   Respiratory:  no respiratory distress, normal breath sounds   Abdomen: soft,  non-tender; bowel sounds normal; no masses,  no organomegaly     Assessment:   Pregnancy: [id number] Patient Active Problem List   Diagnosis Date Noted   Supervision of other normal pregnancy, antepartum 08/09/2022   Gestational hypertension 07/01/2021   Itching 05/15/2021   Rh negative state in antepartum period 12/13/2020   Supervision of low-risk first pregnancy, first trimester 11/25/2020   History of miscarriage 11/25/2020     Plan:  1. Supervision of other normal pregnancy, antepartum --Anticipatory guidance about next visits/weeks of pregnancy given.   2. Anxiety --Rx for Zoloft 100 mg daily, pt still taking 75 mg daily until she runs out at the end of this month, plans to increase to 100 mg as prescribed.   --Doing Ok overall, some irritability with pregnancy. --Refer to [facility], pt prefers to see Sue Lush with next visit, she has her son with her today    4. Nausea/vomiting in pregnancy --Taking Phenergan and Zofran sparingly but still having a lot of n/v.  Discussed trying Diclegis daily to prevent, continue  to use the others sparingly. Rx sent.  - Doxylamine-Pyridoxine (DICLEGIS) 10-10 MG TBEC; Take 2 tabs at bedtime. If needed, add another tab in the morning. If needed, add another tab in the afternoon, up to 4 tabs/day.  Dispense: 100 tablet; Refill: 5  Initial labs drawn.   5. Unwanted fertility --Pt desires BTL for contraception. She has custody of her brother, and reports that both pregnancies have been difficult for her.  She is sure she does not want more children. --I discussed long term alternatives like IUD, but pt to follow up with MD to discuss.   Continue prenatal vitamins. NIPS: results reviewed. Ultrasound discussed; fetal anatomic survey: ordered. Problem list reviewed and updated. The nature of [facility] with multiple MDs and other Advanced Practice Providers was explained to patient; also emphasized that residents,  students are part of our team. Routine obstetric precautions reviewed. Return in about 4 weeks (around 09/22/2022).   Sharen Counter, CNM 08/25/22 12:22 PM

## 2022-08-25 NOTE — Progress Notes (Signed)
NOB is in the office. Intake completed on 08/09/22 and u/s on 07/08/22.  Last pap 11/10/2020 Pt denies pain, reports mild uterine irritability at times.

## 2022-09-07 DIAGNOSIS — R5383 Other fatigue: Secondary | ICD-10-CM | POA: Diagnosis not present

## 2022-09-07 DIAGNOSIS — J069 Acute upper respiratory infection, unspecified: Secondary | ICD-10-CM | POA: Diagnosis not present

## 2022-09-07 DIAGNOSIS — J029 Acute pharyngitis, unspecified: Secondary | ICD-10-CM | POA: Diagnosis not present

## 2022-09-09 ENCOUNTER — Inpatient Hospital Stay (HOSPITAL_COMMUNITY)
Admission: AD | Admit: 2022-09-09 | Discharge: 2022-09-09 | Disposition: A | Discharge status: Home / Self Care | Payer: Medicaid Other | Attending: Obstetrics and Gynecology | Admitting: Obstetrics and Gynecology

## 2022-09-09 ENCOUNTER — Other Ambulatory Visit: Payer: Self-pay

## 2022-09-09 DIAGNOSIS — Z87440 Personal history of urinary (tract) infections: Secondary | ICD-10-CM | POA: Insufficient documentation

## 2022-09-09 DIAGNOSIS — O9A212 Injury, poisoning and certain other consequences of external causes complicating pregnancy, second trimester: Secondary | ICD-10-CM

## 2022-09-09 DIAGNOSIS — W109XXA Fall (on) (from) unspecified stairs and steps, initial encounter: Secondary | ICD-10-CM | POA: Diagnosis not present

## 2022-09-09 DIAGNOSIS — Z7982 Long term (current) use of aspirin: Secondary | ICD-10-CM | POA: Insufficient documentation

## 2022-09-09 DIAGNOSIS — Z3A15 15 weeks gestation of pregnancy: Secondary | ICD-10-CM | POA: Diagnosis not present

## 2022-09-09 DIAGNOSIS — R1030 Lower abdominal pain, unspecified: Secondary | ICD-10-CM | POA: Diagnosis present

## 2022-09-09 DIAGNOSIS — Z79899 Other long term (current) drug therapy: Secondary | ICD-10-CM | POA: Insufficient documentation

## 2022-09-09 DIAGNOSIS — Z6791 Unspecified blood type, Rh negative: Secondary | ICD-10-CM

## 2022-09-09 DIAGNOSIS — O26892 Other specified pregnancy related conditions, second trimester: Secondary | ICD-10-CM

## 2022-09-09 DIAGNOSIS — Z3480 Encounter for supervision of other normal pregnancy, unspecified trimester: Secondary | ICD-10-CM

## 2022-09-09 MED ORDER — IBUPROFEN 600 MG PO TABS: 600.0000 mg | ORAL_TABLET | Freq: Four times a day (QID) | ORAL | 0 refills | Status: DC | PRN

## 2022-09-09 NOTE — MAU Note (Signed)
Angela Calderon is a 22 y.o. at [redacted]w[redacted]d here in MAU reporting: she fell forward while walking up a flight of stairs and struck abdomen.  Reports lower abdominal cramping. Denies VB or LOF. LMP: NA Onset of complaint: today Pain score: 5 Vitals:   09/09/22 1126  BP: 119/71  Pulse: 78  Resp: 18  Temp: 98.2 F (36.8 C)  SpO2: 99%     FHT: 144 bpm Lab orders placed from triage:   None

## 2022-09-09 NOTE — Progress Notes (Signed)
Bedside ultrasound performed by Ivonne Andrew, CNM.  Both FHR and fetal movement visualized during ultrasound.

## 2022-09-09 NOTE — MAU Provider Note (Signed)
Chief Complaint: Fall   Event Date/Time   First Provider Initiated Contact with Patient 09/09/22 1201     SUBJECTIVE HPI: Angela Calderon is a 22 y.o. G3P1011 at [redacted]w[redacted]d who presents to Maternity Admissions reporting falling on stairs this morning and hitting mid abdomen and right knee. States she went to [facility] because works across the street. States they could not find FHR and did not check with [place].   Reports mild low abd pain/cramping. Denies vaginal bleeding or LOF.   Gets prenatal care at [facility].   RH neg. Last Rhophylac dose 07/28/22.   Past Medical History:  Diagnosis Date   Contraception management 09/05/2020   Pregnancy induced hypertension    UTI (urinary tract infection)    OB History  Gravida Para Term Preterm AB Living  3 1 1  0 1 1  SAB IAB Ectopic Multiple Live Births  1 0 0 0 1    # Outcome Date GA Lbr Len/2nd Weight Sex Delivery Anes PTL Lv  3 Current           2 Term 07/16/21 [redacted]w[redacted]d  2560 g M Vag-Spont EPI  LIV  1 SAB            Past Surgical History:  Procedure Laterality Date   TONSILLECTOMY AND ADENOIDECTOMY Bilateral 09/29/2012   Procedure: TONSILLECTOMY AND ADENOIDECTOMY;  Surgeon: Melvenia Beam, MD;  Location: [facility] OR;  Service: ENT;  Laterality: Bilateral;   Social History   Socioeconomic History   Marital status: Single    Spouse name: Not on file   Number of children: Not on file   Years of education: Not on file   Highest education level: Not on file  Occupational History   Not on file  Tobacco Use   Smoking status: Never    Passive exposure: Yes   Smokeless tobacco: Never  Vaping Use   Vaping Use: Never used  Substance and Sexual Activity   Alcohol use: Not Currently   Drug use: No   Sexual activity: Yes    Partners: Male  Other Topics Concern   Not on file  Social History Narrative   ** Merged History Encounter **       Social Determinants of Health   Financial Resource Strain: Not on file  Food Insecurity: Not  on file  Transportation Needs: Not on file  Physical Activity: Not on file  Stress: Not on file  Social Connections: Not on file  Intimate Partner Violence: Not on file   Family History  Problem Relation Age of Onset   Cervical cancer Mother    Ovarian cancer Paternal Grandmother    Diabetes Paternal Grandfather    Congestive Heart Failure Paternal Grandfather    No current facility-administered medications on file prior to encounter.   Current Outpatient Medications on File Prior to Encounter  Medication Sig Dispense Refill   acetaminophen (TYLENOL) 325 MG tablet Take 2 tablets (650 mg total) by mouth every 4 (four) hours as needed (for pain scale < 4). 60 tablet 0   aspirin EC 81 MG tablet Take 1 tablet (81 mg total) by mouth daily. Swallow whole. 30 tablet 5   Blood Pressure Monitor DEVI Please check blood pressure once a week. 1 each 0   Doxylamine-Pyridoxine (DICLEGIS) 10-10 MG TBEC Take 2 tabs at bedtime. If needed, add another tab in the morning. If needed, add another tab in the afternoon, up to 4 tabs/day. 100 tablet 5   ondansetron (ZOFRAN-ODT) 4 MG disintegrating  tablet Take 1 tablet (4 mg total) by mouth every 6 (six) hours as needed for nausea. 20 tablet 5   promethazine (PHENERGAN) 12.5 MG suppository Place 1 suppository (12.5 mg total) rectally every 6 (six) hours as needed for nausea or vomiting. 12 each 5   promethazine (PHENERGAN) 12.5 MG tablet Take 1 tablet (12.5 mg total) by mouth every 6 (six) hours as needed for nausea or vomiting. 30 tablet 5   sertraline (ZOLOFT) 50 MG tablet Take 2 tablets (100 mg total) by mouth daily. 60 tablet 11   Allergies  Allergen Reactions   Latex Itching    I have reviewed patient's Past Medical Hx, Surgical Hx, Family Hx, Social Hx, medications and allergies.   Review of Systems  Gastrointestinal:  Positive for abdominal pain (mild).  Genitourinary:  Negative for vaginal bleeding and vaginal discharge.  Musculoskeletal:   Negative for joint swelling.       Mild right knee soreness.     OBJECTIVE Patient Vitals for the past 24 hrs:  BP Temp Temp src Pulse Resp SpO2 Height Weight  09/09/22 1126 119/71 98.2 F (36.8 C) Oral 78 18 99 % -- --  09/09/22 1122 -- -- -- -- -- -- 5\' 3"  (1.6 m) 73.8 kg   Constitutional: Well-developed, well-nourished female in no acute distress.  Cardiovascular: normal rate Respiratory: normal rate and effort.  GI: Abd soft, non-tender, gravid appropriate for gestational age.  MS: Declined Neurologic: Alert and oriented x 4.  GU: Not indicated  FHR 144 by doppler  LAB RESULTS No results found for this or any previous visit (from the past 24 hour(s)).  IMAGING Pt informed that the ultrasound is considered a limited OB ultrasound and is not intended to be a complete ultrasound exam.  Patient also informed that the ultrasound is not being completed with the intent of assessing for fetal or placental anomalies or any pelvic abnormalities.  Explained that the purpose of today's ultrasound is to assess for  viability. + FHR and fetal movement observed. Patient acknowledges the purpose of the exam and the limitations of the study.     MAU COURSE Orders Placed This Encounter  Procedures   Discharge patient   Meds ordered this encounter  Medications   ibuprofen (ADVIL) 600 MG tablet    Sig: Take 1 tablet (600 mg total) by mouth every 6 (six) hours as needed for moderate pain or cramping. Do not take for more than 3 days. Do not take after [redacted] weeks gestation.    Dispense:  30 tablet    Refill:  0    Order Specific Question:   Supervising Provider    Answer:   CONSTANT, PEGGY [[id number]]    MDM - Minor fall on stairs at 15 weeks. + FHR. Minor knee injury that pt declines assessment for. Pt reassured by Doppler and BS [place]. CNM explained that baby is too early to be able to continuously monitor and that risk to baby is low this early in pregnancy. Pt denies vaginal bleeding, LOF or  significant pain suggestive of emergent condition.   - RH neg w/ recent Rhophylac 300 mg given 07/28/22. KB drawn it [place]. Rhophylac not indicated.   ASSESSMENT 1. Supervision of other normal pregnancy, antepartum   2. Traumatic injury during pregnancy in second trimester   3. Rh negative state in antepartum period, second trimester   4. [redacted] weeks gestation of pregnancy     PLAN Discharge home in stable condition. Fall precautions Comfort measures  IBU PRN. Do not take for more than 3 days. Do not take after [redacted] weeks gestation.   Follow-up Information     [facility] Follow up.   Why: As scheduled or as needed if symptoms worsen Contact information: [address] [place] [address] [phone]        [facility] Follow up.   Specialty: Obstetrics and Gynecology Why: As needed in emergencies Contact information: [address] [id number] [facility] [address] [place] [address] [phone]               Allergies as of 09/09/2022       Reactions   Latex Itching        Medication List     STOP taking these medications    busPIRone 5 MG tablet Commonly known as: BUSPAR   prenatal multivitamin Tabs tablet       TAKE these medications    acetaminophen 325 MG tablet Commonly known as: Tylenol Take 2 tablets (650 mg total) by mouth every 4 (four) hours as needed (for pain scale < 4).   aspirin EC 81 MG tablet Take 1 tablet (81 mg total) by mouth daily. Swallow whole.   Blood Pressure Monitor Devi Please check blood pressure once a week.   Doxylamine-Pyridoxine 10-10 MG Tbec Commonly known as: Diclegis Take 2 tabs at bedtime. If needed, add another tab in the morning. If needed, add another tab in the afternoon, up to 4 tabs/day.   ibuprofen 600 MG tablet Commonly known as: ADVIL Take 1 tablet (600 mg total) by mouth every 6 (six) hours as needed for moderate pain or  cramping. Do not take for more than 3 days. Do not take after [redacted] weeks gestation.   ondansetron 4 MG disintegrating tablet Commonly known as: ZOFRAN-ODT Take 1 tablet (4 mg total) by mouth every 6 (six) hours as needed for nausea.   promethazine 12.5 MG tablet Commonly known as: PHENERGAN Take 1 tablet (12.5 mg total) by mouth every 6 (six) hours as needed for nausea or vomiting.   promethazine 12.5 MG suppository Commonly known as: PHENERGAN Place 1 suppository (12.5 mg total) rectally every 6 (six) hours as needed for nausea or vomiting.   sertraline 50 MG tablet Commonly known as: Zoloft Take 2 tablets (100 mg total) by mouth daily.         Katrinka Blazing, [place], CNM 09/09/2022  1:28 PM

## 2022-09-28 ENCOUNTER — Other Ambulatory Visit (HOSPITAL_COMMUNITY)
Admission: RE | Admit: 2022-09-28 | Discharge: 2022-09-28 | Disposition: A | Discharge status: Home / Self Care | Payer: Medicaid Other | Source: Ambulatory Visit | Attending: Advanced Practice Midwife | Admitting: Advanced Practice Midwife

## 2022-09-28 ENCOUNTER — Encounter: Payer: Self-pay | Admitting: *Deleted

## 2022-09-28 ENCOUNTER — Ambulatory Visit (INDEPENDENT_AMBULATORY_CARE_PROVIDER_SITE_OTHER): Payer: Medicaid Other | Admitting: Advanced Practice Midwife

## 2022-09-28 VITALS — BP 119/75 | HR 89 | Wt 162.4 lb

## 2022-09-28 DIAGNOSIS — O26892 Other specified pregnancy related conditions, second trimester: Secondary | ICD-10-CM | POA: Insufficient documentation

## 2022-09-28 DIAGNOSIS — Z3A18 18 weeks gestation of pregnancy: Secondary | ICD-10-CM

## 2022-09-28 DIAGNOSIS — Z3480 Encounter for supervision of other normal pregnancy, unspecified trimester: Secondary | ICD-10-CM

## 2022-09-28 DIAGNOSIS — N898 Other specified noninflammatory disorders of vagina: Secondary | ICD-10-CM | POA: Insufficient documentation

## 2022-09-28 MED ORDER — TERCONAZOLE 0.4 % VA CREA: 1.0000 | TOPICAL_CREAM | Freq: Every day | VAGINAL | 0 refills | Status: DC

## 2022-09-28 NOTE — Progress Notes (Signed)
   PRENATAL VISIT NOTE  Subjective:  Angela Calderon is a 22 y.o. G3P1011 at [redacted]w[redacted]d being seen today for ongoing prenatal care.  She is currently monitored for the following issues for this low-risk pregnancy and has Rh negative state in antepartum period; Gestational hypertension; and Supervision of other normal pregnancy, antepartum on their problem list.  Patient reports  vaginal discharge, white, sometimes green, no itching/irritation .  Contractions: Not present. Vag. Bleeding: None.   . Denies leaking of fluid.   The following portions of the patient's history were reviewed and updated as appropriate: allergies, current medications, past family history, past medical history, past social history, past surgical history and problem list.   Objective:   Vitals:   09/28/22 0953  BP: 119/75  Pulse: 89  Weight: 162 lb 6.4 oz (73.7 kg)    Fetal Status: Fetal Heart Rate (bpm): 136         General:  Alert, oriented and cooperative. Patient is in no acute distress.  Skin: Skin is warm and dry. No rash noted.   Cardiovascular: Normal heart rate noted  Respiratory: Normal respiratory effort, no problems with respiration noted  Abdomen: Soft, gravid, appropriate for gestational age.  Pain/Pressure: Absent     Pelvic: Cervical exam deferred        SSE with thick white discharge clinging to cervix, vaginal walls.   Extremities: Normal range of motion.  Edema: None  Mental Status: Normal mood and affect. Normal behavior. Normal judgment and thought content.   Assessment and Plan:  Pregnancy: G3P1011 at [redacted]w[redacted]d 1. Supervision of other normal pregnancy, antepartum --Anticipatory guidance about next visits/weeks of pregnancy given.  - AFP, Serum, Open Spina Bifida --Anatomy US on 6/21  2. [redacted] weeks gestation of pregnancy   3. Vaginal discharge during pregnancy in second trimester --Pt with white/sometimes green discharge x 1 months. Previous swabs negative. --SSE exam today with some thick  white discharge clinging to cervix, c/w yeast.  --Will send Terazol 7  Rx  - Cervicovaginal ancillary only( )   Preterm labor symptoms and general obstetric precautions including but not limited to vaginal bleeding, contractions, leaking of fluid and fetal movement were reviewed in detail with the patient. Please refer to After Visit Summary for other counseling recommendations.   Return in about 4 weeks (around 10/26/2022) for Midwife preferred, LOB.  Future Appointments  Date Time Provider Department Center  10/01/2022  1:45 PM [facility] US5 [facility]  10/26/2022  4:10 PM Leftwich-Kirby, Wilmer Floor, CNM [facility] None  11/29/2022  8:15 AM Constant, Gigi Gin, MD [facility] None  11/29/2022  8:45 AM [facility] None    Sharen Counter, CNM

## 2022-09-28 NOTE — Progress Notes (Signed)
Pt states that she is not feeling fetal movement yet.

## 2022-09-29 LAB — CERVICOVAGINAL ANCILLARY ONLY
Bacterial Vaginitis (gardnerella): NEGATIVE
Candida Glabrata: NEGATIVE
Candida Vaginitis: NEGATIVE
Chlamydia: NEGATIVE
Comment: NEGATIVE
Comment: NEGATIVE
Comment: NEGATIVE
Comment: NEGATIVE
Comment: NEGATIVE
Comment: NORMAL
Neisseria Gonorrhea: NEGATIVE
Trichomonas: NEGATIVE

## 2022-09-30 LAB — AFP, SERUM, OPEN SPINA BIFIDA
AFP MoM: 0.78
AFP Value: 32.9 ng/mL
Gest. Age on Collection Date: 18 weeks
Maternal Age At EDD: 23.4 yr
OSBR Risk 1 IN: 10000
Test Results:: NEGATIVE
Weight: 162 [lb_av]

## 2022-10-01 ENCOUNTER — Other Ambulatory Visit: Payer: Self-pay | Admitting: *Deleted

## 2022-10-01 ENCOUNTER — Ambulatory Visit: Payer: Medicaid Other | Attending: Obstetrics and Gynecology

## 2022-10-01 ENCOUNTER — Ambulatory Visit: Payer: Medicaid Other | Admitting: *Deleted

## 2022-10-01 ENCOUNTER — Encounter: Payer: Self-pay | Admitting: *Deleted

## 2022-10-01 VITALS — BP 120/70 | HR 70

## 2022-10-01 DIAGNOSIS — Z3689 Encounter for other specified antenatal screening: Secondary | ICD-10-CM | POA: Insufficient documentation

## 2022-10-01 DIAGNOSIS — Z3482 Encounter for supervision of other normal pregnancy, second trimester: Secondary | ICD-10-CM | POA: Diagnosis not present

## 2022-10-01 DIAGNOSIS — Z8759 Personal history of other complications of pregnancy, childbirth and the puerperium: Secondary | ICD-10-CM

## 2022-10-01 DIAGNOSIS — O4440 Low lying placenta NOS or without hemorrhage, unspecified trimester: Secondary | ICD-10-CM

## 2022-10-01 DIAGNOSIS — Z363 Encounter for antenatal screening for malformations: Secondary | ICD-10-CM | POA: Diagnosis not present

## 2022-10-01 DIAGNOSIS — Z3480 Encounter for supervision of other normal pregnancy, unspecified trimester: Secondary | ICD-10-CM | POA: Diagnosis not present

## 2022-10-04 ENCOUNTER — Encounter: Payer: Self-pay | Admitting: Advanced Practice Midwife

## 2022-10-05 ENCOUNTER — Encounter: Payer: Self-pay | Admitting: Obstetrics & Gynecology

## 2022-10-05 DIAGNOSIS — Z8759 Personal history of other complications of pregnancy, childbirth and the puerperium: Secondary | ICD-10-CM | POA: Insufficient documentation

## 2022-10-05 DIAGNOSIS — O4440 Low lying placenta NOS or without hemorrhage, unspecified trimester: Secondary | ICD-10-CM | POA: Insufficient documentation

## 2022-10-10 ENCOUNTER — Encounter: Payer: Self-pay | Admitting: Advanced Practice Midwife

## 2022-10-10 ENCOUNTER — Encounter (HOSPITAL_COMMUNITY): Payer: Self-pay | Admitting: Obstetrics & Gynecology

## 2022-10-10 ENCOUNTER — Inpatient Hospital Stay (HOSPITAL_COMMUNITY)
Admission: AD | Admit: 2022-10-10 | Discharge: 2022-10-10 | Disposition: A | Discharge status: Home / Self Care | Payer: Medicaid Other | Attending: Obstetrics & Gynecology | Admitting: Obstetrics & Gynecology

## 2022-10-10 DIAGNOSIS — Z3A20 20 weeks gestation of pregnancy: Secondary | ICD-10-CM | POA: Diagnosis not present

## 2022-10-10 DIAGNOSIS — R1032 Left lower quadrant pain: Secondary | ICD-10-CM | POA: Diagnosis not present

## 2022-10-10 DIAGNOSIS — O209 Hemorrhage in early pregnancy, unspecified: Secondary | ICD-10-CM | POA: Diagnosis not present

## 2022-10-10 DIAGNOSIS — N920 Excessive and frequent menstruation with regular cycle: Secondary | ICD-10-CM

## 2022-10-10 DIAGNOSIS — O26892 Other specified pregnancy related conditions, second trimester: Secondary | ICD-10-CM | POA: Diagnosis present

## 2022-10-10 DIAGNOSIS — N898 Other specified noninflammatory disorders of vagina: Secondary | ICD-10-CM | POA: Diagnosis not present

## 2022-10-10 DIAGNOSIS — O09292 Supervision of pregnancy with other poor reproductive or obstetric history, second trimester: Secondary | ICD-10-CM | POA: Diagnosis not present

## 2022-10-10 LAB — WET PREP, GENITAL
Clue Cells Wet Prep HPF POC: NONE SEEN
Sperm: NONE SEEN
Trich, Wet Prep: NONE SEEN
WBC, Wet Prep HPF POC: >=10 — AB (ref <10)
Yeast Wet Prep HPF POC: NONE SEEN

## 2022-10-10 LAB — URINALYSIS, ROUTINE W REFLEX MICROSCOPIC
Bilirubin Urine: NEGATIVE
Glucose, UA: NEGATIVE mg/dL
Hgb urine dipstick: NEGATIVE
Ketones, ur: 5 mg/dL — AB
Leukocytes,Ua: NEGATIVE
Nitrite: NEGATIVE
Protein, ur: NEGATIVE mg/dL
Specific Gravity, Urine: 1.026 (ref 1.005–1.030)
pH: 5 (ref 5.0–8.0)

## 2022-10-10 MED ORDER — CYCLOBENZAPRINE HCL 5 MG PO TABS: 5.0000 mg | ORAL_TABLET | Freq: Three times a day (TID) | ORAL | 0 refills | Status: DC | PRN

## 2022-10-10 NOTE — MAU Note (Cosign Needed)
.  Angela Calderon is a 22 y.o. at [redacted]w[redacted]d here in MAU reporting:  Last night around 930pm pt states she started noticing some cramping and then today pt noticed some blood in her discharge. Pt states she has not filled up any pads and no blood clots and is not currently bleeding.   Pt does have a low lying placenta. No recent intercourse.    Pain score: 6/10 lower left abdominal cramping that comes and goes.  Vitals:   10/10/22 2200  BP: 125/69  Pulse: 86  Resp: 17  Temp: 98 F (36.7 C)  SpO2: 99%    FHT:135  Lab orders placed from triage:

## 2022-10-10 NOTE — MAU Provider Note (Signed)
History     CSN: [id number]  Arrival date and time: 10/10/22 2136   Event Date/Time   First Provider Initiated Contact with Patient 10/10/22 2213      Chief Complaint  Patient presents with   Vaginal Bleeding   Vaginal Discharge   Abdominal Pain   Angela Calderon , a  22 y.o. G3P1011 at [redacted]w[redacted]d presents to MAU with complaints of left sided abdominal cramping and spotting. Patient states that yesterday she noted some intermittent abdominal cramping on the left side. She states that she became worried when she saw purple streaks of blood in her vaginal discharge today. She states that she also noted that her abdominal cramping also got worse. She states that pain is currently a 6/10. She denies attempting to relieve symptoms and denies worsening or alleviating symptoms. She endorses "thick greenish" vaginal discharge, but states that she was just treated for yeast. Denies urinary symptoms. She denies bright red vaginal bleeding, and contractions.          OB History     Gravida  3   Para  1   Term  1   Preterm  0   AB  1   Living  1      SAB  1   IAB  0   Ectopic  0   Multiple  0   Live Births  1           Past Medical History:  Diagnosis Date   Contraception management 09/05/2020   Gestational hypertension 07/01/2021   History of miscarriage 11/25/2020   Itching 05/15/2021   normal bile acids 05/15/21   Pregnancy induced hypertension    UTI (urinary tract infection)     Past Surgical History:  Procedure Laterality Date   TONSILLECTOMY AND ADENOIDECTOMY Bilateral 09/29/2012   Procedure: TONSILLECTOMY AND ADENOIDECTOMY;  Surgeon: Melvenia Beam, MD;  Location: [facility] OR;  Service: ENT;  Laterality: Bilateral;    Family History  Problem Relation Age of Onset   Cervical cancer Mother    Ovarian cancer Paternal Grandmother    Diabetes Paternal Grandfather    Congestive Heart Failure Paternal Grandfather     Social History   Tobacco Use   Smoking  status: Never    Passive exposure: Yes   Smokeless tobacco: Never  Vaping Use   Vaping Use: Never used  Substance Use Topics   Alcohol use: Not Currently   Drug use: No    Allergies:  Allergies  Allergen Reactions   Latex Itching    Medications Prior to Admission  Medication Sig Dispense Refill Last Dose   acetaminophen (TYLENOL) 325 MG tablet Take 2 tablets (650 mg total) by mouth every 4 (four) hours as needed (for pain scale < 4). 60 tablet 0    aspirin EC 81 MG tablet Take 1 tablet (81 mg total) by mouth daily. Swallow whole. 30 tablet 5    Blood Pressure Monitor DEVI Please check blood pressure once a week. 1 each 0    Doxylamine-Pyridoxine (DICLEGIS) 10-10 MG TBEC Take 2 tabs at bedtime. If needed, add another tab in the morning. If needed, add another tab in the afternoon, up to 4 tabs/day. (Patient not taking: Reported on 09/28/2022) 100 tablet 5    ibuprofen (ADVIL) 600 MG tablet Take 1 tablet (600 mg total) by mouth every 6 (six) hours as needed for moderate pain or cramping. Do not take for more than 3 days. Do not take after [redacted] weeks gestation. (Patient  not taking: Reported on 09/28/2022) 30 tablet 0    ondansetron (ZOFRAN-ODT) 4 MG disintegrating tablet Take 1 tablet (4 mg total) by mouth every 6 (six) hours as needed for nausea. (Patient not taking: Reported on 09/28/2022) 20 tablet 5    promethazine (PHENERGAN) 12.5 MG suppository Place 1 suppository (12.5 mg total) rectally every 6 (six) hours as needed for nausea or vomiting. 12 each 5    promethazine (PHENERGAN) 12.5 MG tablet Take 1 tablet (12.5 mg total) by mouth every 6 (six) hours as needed for nausea or vomiting. (Patient not taking: Reported on 09/28/2022) 30 tablet 5    sertraline (ZOLOFT) 50 MG tablet Take 2 tablets (100 mg total) by mouth daily. 60 tablet 11    terconazole (TERAZOL 7) 0.4 % vaginal cream Place 1 applicator vaginally at bedtime. 45 g 0     Review of Systems  Constitutional:  Negative for chills,  fatigue and fever.  Eyes:  Negative for pain and visual disturbance.  Respiratory:  Negative for apnea, shortness of breath and wheezing.   Cardiovascular:  Negative for chest pain and palpitations.  Gastrointestinal:  Positive for abdominal pain. Negative for constipation, diarrhea, nausea and vomiting.  Genitourinary:  Positive for vaginal discharge. Negative for difficulty urinating, dysuria, pelvic pain, vaginal bleeding and vaginal pain.  Musculoskeletal:  Negative for back pain.  Neurological:  Negative for seizures, weakness and headaches.  Psychiatric/Behavioral:  Negative for suicidal ideas.    Physical Exam   Blood pressure 114/71, pulse 70, temperature 98 F (36.7 C), temperature source Oral, resp. rate 17, height 5\' 3"  (1.6 m), weight 73 kg, SpO2 99 %, not currently breastfeeding.  Physical Exam Vitals and nursing note reviewed. Exam conducted with a chaperone present.  Constitutional:      General: She is not in acute distress.    Appearance: Normal appearance.  HENT:     Head: Normocephalic.  Pulmonary:     Effort: Pulmonary effort is normal.  Abdominal:     Tenderness: There is abdominal tenderness in the left lower quadrant. There is no right CVA tenderness, left CVA tenderness, guarding or rebound.  Genitourinary:    Vagina: Vaginal discharge present.     Cervix: Normal.     Comments: No signs of bleeding noted on exam. White green tinged discharge noted on exam and adhered to Vaginal walls consistent with yeast.  Musculoskeletal:     Cervical back: Normal range of motion.  Skin:    General: Skin is warm and dry.  Neurological:     Mental Status: She is alert and oriented to person, place, and time.  Psychiatric:        Mood and Affect: Mood normal.   Dilation: Closed Exam by:: Dorathy Daft, CNM  FHT obtained in triage.   MAU Course  Procedures Orders Placed This Encounter  Procedures   Wet prep, genital   Urinalysis, Routine w reflex microscopic  -Urine, Clean Catch   Results for orders placed or performed during the hospital encounter of 10/10/22 (from the past 24 hour(s))  Urinalysis, Routine w reflex microscopic -Urine, Clean Catch     Status: Abnormal   Collection Time: 10/10/22  9:55 PM  Result Value Ref Range   Color, Urine YELLOW YELLOW   APPearance HAZY (A) CLEAR   Specific Gravity, Urine 1.026 1.005 - 1.030   pH 5.0 5.0 - 8.0   Glucose, UA NEGATIVE NEGATIVE mg/dL   Hgb urine dipstick NEGATIVE NEGATIVE   Bilirubin Urine NEGATIVE NEGATIVE  Ketones, ur 5 (A) NEGATIVE mg/dL   Protein, ur NEGATIVE NEGATIVE mg/dL   Nitrite NEGATIVE NEGATIVE   Leukocytes,Ua NEGATIVE NEGATIVE  Wet prep, genital     Status: Abnormal   Collection Time: 10/10/22 10:25 PM  Result Value Ref Range   Yeast Wet Prep HPF POC NONE SEEN NONE SEEN   Trich, Wet Prep NONE SEEN NONE SEEN   Clue Cells Wet Prep HPF POC NONE SEEN NONE SEEN   WBC, Wet Prep HPF POC >=10 (A) <10   Sperm NONE SEEN     MDM - CNM offered pain medication, patient declined. - Cervix closed low suspicion for preterm labor.  - Wet prep normal- Patient recently treated for yeast with 3 day Terazole.  - UA hazy with 5 of ketones.  -  plan for discharge   Assessment and Plan   1. Vaginal discharge   2. Spotting   3. [redacted] weeks gestation of pregnancy   4. Left lower quadrant abdominal pain    - Reviewed results with patient. Given that patient just finished Terazole 3 and currently has not symptoms, not planning to treat at this time.  - Reviewed low-lying placenta and what that may look like as the pregnancy continues.  - Discussed comfort measures that are safe in pregnancy for pain.  - Worsening signs and return precautions reviewed.  - Patient discharged home in stable conditions and may return to MAU as needed.   Claudette Head, MSN CNM  10/10/2022, 10:13 PM

## 2022-10-11 ENCOUNTER — Telehealth: Payer: Self-pay | Admitting: Emergency Medicine

## 2022-10-11 LAB — GC/CHLAMYDIA PROBE AMP (~~LOC~~) NOT AT ARMC
Chlamydia: NEGATIVE
Comment: NEGATIVE
Comment: NORMAL
Neisseria Gonorrhea: NEGATIVE

## 2022-10-11 NOTE — Telephone Encounter (Signed)
TC to patient to follow up from MyChart message.  Pt advised to hydrate, rest, and not to drive while taking muscle relaxer.  All other questions answered.  Given precautions to present to MAU.

## 2022-10-26 ENCOUNTER — Encounter: Payer: Self-pay | Admitting: Advanced Practice Midwife

## 2022-10-26 ENCOUNTER — Ambulatory Visit: Payer: Medicaid Other | Admitting: Advanced Practice Midwife

## 2022-10-26 VITALS — BP 119/78 | HR 69 | Wt 168.4 lb

## 2022-10-26 DIAGNOSIS — Z6791 Unspecified blood type, Rh negative: Secondary | ICD-10-CM

## 2022-10-26 DIAGNOSIS — Z3480 Encounter for supervision of other normal pregnancy, unspecified trimester: Secondary | ICD-10-CM

## 2022-10-26 DIAGNOSIS — Z3A22 22 weeks gestation of pregnancy: Secondary | ICD-10-CM

## 2022-10-26 DIAGNOSIS — O26892 Other specified pregnancy related conditions, second trimester: Secondary | ICD-10-CM

## 2022-10-26 DIAGNOSIS — O4440 Low lying placenta NOS or without hemorrhage, unspecified trimester: Secondary | ICD-10-CM

## 2022-10-26 DIAGNOSIS — R102 Pelvic and perineal pain: Secondary | ICD-10-CM

## 2022-10-26 NOTE — Progress Notes (Signed)
Pt presents for ROB visit. Pt c/o lower abd pain and cramping. Pt was seen at MAU on 10/10/22 for vaginal bleeding, no bleeding since

## 2022-10-26 NOTE — Progress Notes (Signed)
   PRENATAL VISIT NOTE  Subjective:  Angela Calderon is a 22 y.o. G3P1011 at [redacted]w[redacted]d being seen today for ongoing prenatal care.  She is currently monitored for the following issues for this low-risk pregnancy and has Rh negative state in antepartum period; Supervision of other normal pregnancy, antepartum; Low lying placenta, antepartum; and History of gestational hypertension on their problem list.  Patient reports  pelvic pressure with standing, cramping 2-3 days ago .  Contractions: Irritability. Vag. Bleeding: None.  Movement: Present. Denies leaking of fluid.   The following portions of the patient's history were reviewed and updated as appropriate: allergies, current medications, past family history, past medical history, past social history, past surgical history and problem list.   Objective:   Vitals:   10/26/22 1635  BP: 119/78  Pulse: 69  Weight: 168 lb 6.4 oz (76.4 kg)    Fetal Status: Fetal Heart Rate (bpm): 152   Movement: Present     General:  Alert, oriented and cooperative. Patient is in no acute distress.  Skin: Skin is warm and dry. No rash noted.   Cardiovascular: Normal heart rate noted  Respiratory: Normal respiratory effort, no problems with respiration noted  Abdomen: Soft, gravid, appropriate for gestational age.  Pain/Pressure: Present     Pelvic: Cervical exam deferred        Extremities: Normal range of motion.  Edema: Trace  Mental Status: Normal mood and affect. Normal behavior. Normal judgment and thought content.   Assessment and Plan:  Pregnancy: G3P1011 at [redacted]w[redacted]d 1. Supervision of other normal pregnancy, antepartum --Anticipatory guidance about next visits/weeks of pregnancy given.   2. Pelvic pressure in pregnancy, antepartum, second trimester -Pressure improves with rest --Rest/ice/heat/warm bath/increase PO fluids/Tylenol/pregnancy support belt  --PTL precautions, reasons to seek care reviewed  3. Rh negative state in antepartum period,  second trimester --Pt was seen in MAU on 10/10/22 with spotting.  SSE negative for blood so rhogam not given. Pt concerned about this and asked questions about how/when this is given.  I discussed very low risk of seroconversion with scant bleeding and none on exam but we can do antibody screen lab with rhogam administration at 28 weeks, to confirm she is still antibody negative.  - Antibody screen; Future  4. [redacted] weeks gestation of pregnancy  5. Low lying placenta, antepartum --[place] on Friday to evaluate, no bleeding since 6/30 MAU visit   Preterm labor symptoms and general obstetric precautions including but not limited to vaginal bleeding, contractions, leaking of fluid and fetal movement were reviewed in detail with the patient. Please refer to After Visit Summary for other counseling recommendations.   No follow-ups on file.  Future Appointments  Date Time Provider Department Center  11/05/2022  3:00 PM [facility] US1 [facility]  11/29/2022  8:15 AM Leftwich-Kirby, Wilmer Floor, CNM [facility] None  11/29/2022  8:45 AM [facility] None    Sharen Counter, CNM

## 2022-11-03 ENCOUNTER — Inpatient Hospital Stay (HOSPITAL_BASED_OUTPATIENT_CLINIC_OR_DEPARTMENT_OTHER): Payer: Medicaid Other

## 2022-11-03 ENCOUNTER — Inpatient Hospital Stay (HOSPITAL_COMMUNITY)
Admission: AD | Admit: 2022-11-03 | Discharge: 2022-11-03 | Disposition: A | Discharge status: Home / Self Care | Payer: Medicaid Other | Attending: Obstetrics and Gynecology | Admitting: Obstetrics and Gynecology

## 2022-11-03 DIAGNOSIS — O360120 Maternal care for anti-D [Rh] antibodies, second trimester, not applicable or unspecified: Secondary | ICD-10-CM | POA: Diagnosis not present

## 2022-11-03 DIAGNOSIS — Y939 Activity, unspecified: Secondary | ICD-10-CM | POA: Diagnosis not present

## 2022-11-03 DIAGNOSIS — Z23 Encounter for immunization: Secondary | ICD-10-CM | POA: Insufficient documentation

## 2022-11-03 DIAGNOSIS — O4442 Low lying placenta NOS or without hemorrhage, second trimester: Secondary | ICD-10-CM | POA: Diagnosis not present

## 2022-11-03 DIAGNOSIS — O09292 Supervision of pregnancy with other poor reproductive or obstetric history, second trimester: Secondary | ICD-10-CM | POA: Insufficient documentation

## 2022-11-03 DIAGNOSIS — Z3A23 23 weeks gestation of pregnancy: Secondary | ICD-10-CM | POA: Insufficient documentation

## 2022-11-03 DIAGNOSIS — O99891 Other specified diseases and conditions complicating pregnancy: Secondary | ICD-10-CM | POA: Diagnosis not present

## 2022-11-03 DIAGNOSIS — M549 Dorsalgia, unspecified: Secondary | ICD-10-CM | POA: Diagnosis not present

## 2022-11-03 DIAGNOSIS — O26892 Other specified pregnancy related conditions, second trimester: Secondary | ICD-10-CM | POA: Diagnosis not present

## 2022-11-03 DIAGNOSIS — Z6791 Unspecified blood type, Rh negative: Secondary | ICD-10-CM | POA: Insufficient documentation

## 2022-11-03 DIAGNOSIS — O9A212 Injury, poisoning and certain other consequences of external causes complicating pregnancy, second trimester: Secondary | ICD-10-CM | POA: Diagnosis not present

## 2022-11-03 LAB — URINALYSIS, ROUTINE W REFLEX MICROSCOPIC
Bilirubin Urine: NEGATIVE
Glucose, UA: NEGATIVE mg/dL
Hgb urine dipstick: NEGATIVE
Ketones, ur: NEGATIVE mg/dL
Nitrite: NEGATIVE
Protein, ur: NEGATIVE mg/dL
Specific Gravity, Urine: 1.013 (ref 1.005–1.030)
pH: 6 (ref 5.0–8.0)

## 2022-11-03 LAB — RH IG WORKUP (INCLUDES ABO/RH)
Gestational Age(Wks): 23
Unit division: 0

## 2022-11-03 MED ORDER — CYCLOBENZAPRINE HCL 10 MG PO TABS: 10.0000 mg | ORAL_TABLET | Freq: Two times a day (BID) | ORAL | 0 refills | Status: DC | PRN

## 2022-11-03 MED ORDER — RHO D IMMUNE GLOBULIN 1500 UNIT/2ML IJ SOSY
300.0000 ug | PREFILLED_SYRINGE | Freq: Once | INTRAMUSCULAR | Status: AC
  Administered 2022-11-03: 300 ug via INTRAMUSCULAR
  Filled 2022-11-03: qty 2

## 2022-11-03 MED ORDER — ACETAMINOPHEN 500 MG PO TABS
1000.0000 mg | ORAL_TABLET | Freq: Once | ORAL | Status: AC
  Administered 2022-11-03: 1000 mg via ORAL
  Filled 2022-11-03: qty 2
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
## 2022-11-04 ENCOUNTER — Encounter: Payer: Self-pay | Admitting: Advanced Practice Midwife

## 2022-11-04 ENCOUNTER — Telehealth: Payer: Self-pay

## 2022-11-04 LAB — RH IG WORKUP (INCLUDES ABO/RH)
ABO/RH(D): O NEG
Unit division: 0

## 2022-11-04 LAB — TYPE AND SCREEN
ABO/RH(D): O NEG
Antibody Screen: POSITIVE

## 2022-11-04 LAB — KLEIHAUER-BETKE STAIN
# Vials RhIg: 1
Fetal Cells %: 0 %
Quantitation Fetal Hemoglobin: 0 mL
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
## 2022-11-05 ENCOUNTER — Ambulatory Visit: Payer: Medicaid Other

## 2022-11-05 ENCOUNTER — Other Ambulatory Visit: Payer: Self-pay | Admitting: *Deleted

## 2022-11-05 ENCOUNTER — Telehealth: Payer: Self-pay | Admitting: Advanced Practice Midwife

## 2022-11-05 DIAGNOSIS — O4440 Low lying placenta NOS or without hemorrhage, unspecified trimester: Secondary | ICD-10-CM | POA: Diagnosis present

## 2022-11-05 DIAGNOSIS — O09292 Supervision of pregnancy with other poor reproductive or obstetric history, second trimester: Secondary | ICD-10-CM | POA: Diagnosis not present

## 2022-11-05 DIAGNOSIS — Z8759 Personal history of other complications of pregnancy, childbirth and the puerperium: Secondary | ICD-10-CM

## 2022-11-05 DIAGNOSIS — O4442 Low lying placenta NOS or without hemorrhage, second trimester: Secondary | ICD-10-CM

## 2022-11-05 DIAGNOSIS — Z3A24 24 weeks gestation of pregnancy: Secondary | ICD-10-CM | POA: Diagnosis not present

## 2022-11-05 DIAGNOSIS — M549 Dorsalgia, unspecified: Secondary | ICD-10-CM

## 2022-11-05 DIAGNOSIS — O360120 Maternal care for anti-D [Rh] antibodies, second trimester, not applicable or unspecified: Secondary | ICD-10-CM

## 2022-11-05 MED ORDER — CEFADROXIL 500 MG PO CAPS: 500.0000 mg | ORAL_CAPSULE | Freq: Two times a day (BID) | ORAL | 0 refills | Status: AC

## 2022-11-05 MED ORDER — TERCONAZOLE 0.4 % VA CREA: 1.0000 | TOPICAL_CREAM | Freq: Every day | VAGINAL | 0 refills | Status: DC
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
## 2022-11-23 ENCOUNTER — Other Ambulatory Visit: Payer: Self-pay

## 2022-11-23 ENCOUNTER — Encounter: Payer: Self-pay | Admitting: Advanced Practice Midwife

## 2022-11-23 ENCOUNTER — Telehealth: Payer: Self-pay

## 2022-11-23 DIAGNOSIS — Z3480 Encounter for supervision of other normal pregnancy, unspecified trimester: Secondary | ICD-10-CM

## 2022-11-23 NOTE — Telephone Encounter (Signed)
Called patient regarding mychart message stating that she has been getting elevated Bps at home. Patient sates that BP this morning was in the high 130s/89. Reports some occasional headaches. Unable to check BP at time of call due to being at work.  Patient offered a Nurse Visit BP check, but states that she is unable to come in before Friday due to her work scheduled.  Patient advised to continue to monitor BP at home and record them and her symptoms. Advised to bring with her to her next ROB on 8/19.   Advised if she continues to get elevated BP associated with sx such as headaches and visual changes to report to MAU for evaluation.  New Baby Rx link sent to patient.  Patient verbalized understanding.

## 2022-11-29 ENCOUNTER — Ambulatory Visit (INDEPENDENT_AMBULATORY_CARE_PROVIDER_SITE_OTHER): Payer: Medicaid Other | Admitting: Advanced Practice Midwife

## 2022-11-29 ENCOUNTER — Encounter: Payer: Self-pay | Admitting: Advanced Practice Midwife

## 2022-11-29 ENCOUNTER — Other Ambulatory Visit: Payer: Medicaid Other

## 2022-11-29 VITALS — BP 124/86 | HR 85 | Wt 173.7 lb

## 2022-11-29 DIAGNOSIS — M549 Dorsalgia, unspecified: Secondary | ICD-10-CM

## 2022-11-29 DIAGNOSIS — O99892 Other specified diseases and conditions complicating childbirth: Secondary | ICD-10-CM | POA: Diagnosis not present

## 2022-11-29 DIAGNOSIS — Z3480 Encounter for supervision of other normal pregnancy, unspecified trimester: Secondary | ICD-10-CM

## 2022-11-29 DIAGNOSIS — M5432 Sciatica, left side: Secondary | ICD-10-CM

## 2022-11-29 DIAGNOSIS — Z3A27 27 weeks gestation of pregnancy: Secondary | ICD-10-CM | POA: Diagnosis not present

## 2022-11-29 DIAGNOSIS — Z8759 Personal history of other complications of pregnancy, childbirth and the puerperium: Secondary | ICD-10-CM

## 2022-11-29 NOTE — Progress Notes (Signed)
   PRENATAL VISIT NOTE  Subjective:  Angela Calderon is a 23 y.o. G3P1011 at [redacted]w[redacted]d being seen today for ongoing prenatal care.  She is currently monitored for the following issues for this low-risk pregnancy and has Rh negative state in antepartum period; Supervision of other normal pregnancy, antepartum; Low lying placenta, antepartum; and History of gestational hypertension on their problem list.  Patient reports backache.  Contractions: Not present. Vag. Bleeding: None.  Movement: Present. Denies leaking of fluid.   The following portions of the patient's history were reviewed and updated as appropriate: allergies, current medications, past family history, past medical history, past social history, past surgical history and problem list.   Objective:   Vitals:   11/29/22 0838  BP: 124/86  Pulse: 85  Weight: 173 lb 11.2 oz (78.8 kg)    Fetal Status: Fetal Heart Rate (bpm): 137   Movement: Present     General:  Alert, oriented and cooperative. Patient is in no acute distress.  Skin: Skin is warm and dry. No rash noted.   Cardiovascular: Normal heart rate noted  Respiratory: Normal respiratory effort, no problems with respiration noted  Abdomen: Soft, gravid, appropriate for gestational age.  Pain/Pressure: Present     Pelvic: Cervical exam deferred        Extremities: Normal range of motion.  Edema: Trace  Mental Status: Normal mood and affect. Normal behavior. Normal judgment and thought content.   Assessment and Plan:  Pregnancy: G3P1011 at [redacted]w[redacted]d 1. Supervision of other normal pregnancy, antepartum --Anticipatory guidance about next visits/weeks of pregnancy given.   - Glucose Tolerance, 2 Hours w/1 Hour - HIV antibody (with reflex) - RPR - CBC  2. Back pain affecting pregnancy in second trimester --Rest/ice/heat/warm bath/increase PO fluids/Tylenol/pregnancy support belt   3. History of gestational hypertension --BP higher than pt baseline but none in HTN range.  Pt  to take at home daily or 3 times per week at rest and enter into Babyscripts. S/sx of PEC reviewed.   4. [redacted] weeks gestation of pregnancy   5. Sciatica of left side    Preterm labor symptoms and general obstetric precautions including but not limited to vaginal bleeding, contractions, leaking of fluid and fetal movement were reviewed in detail with the patient. Please refer to After Visit Summary for other counseling recommendations.   Return in about 2 weeks (around 12/13/2022) for Prefers me, low risk OB.  Future Appointments  Date Time Provider Department Center  12/31/2022  3:30 PM [facility] US5 [facility]    Sharen Counter, CNM

## 2022-11-30 LAB — CBC
Hematocrit: 32.6 % — ABNORMAL LOW (ref 34.0–46.6)
Hemoglobin: 10.8 g/dL — ABNORMAL LOW (ref 11.1–15.9)
MCH: 30.3 pg (ref 26.6–33.0)
MCHC: 33.1 g/dL (ref 31.5–35.7)
MCV: 91 fL (ref 79–97)
Platelets: 263 10*3/uL (ref 150–450)
RBC: 3.57 x10E6/uL — ABNORMAL LOW (ref 3.77–5.28)
RDW: 12.4 % (ref 11.7–15.4)
WBC: 12.7 10*3/uL — ABNORMAL HIGH (ref 3.4–10.8)

## 2022-11-30 LAB — GLUCOSE TOLERANCE, 2 HOURS W/ 1HR
Glucose, 1 hour: 140 mg/dL (ref 70–179)
Glucose, 2 hour: 83 mg/dL (ref 70–152)
Glucose, Fasting: 68 mg/dL — ABNORMAL LOW (ref 70–91)

## 2022-11-30 LAB — RPR: RPR Ser Ql: NONREACTIVE

## 2022-11-30 LAB — HIV ANTIBODY (ROUTINE TESTING W REFLEX): HIV Screen 4th Generation wRfx: NONREACTIVE

## 2022-12-10 ENCOUNTER — Encounter (HOSPITAL_COMMUNITY): Payer: Self-pay | Admitting: Obstetrics and Gynecology

## 2022-12-10 ENCOUNTER — Inpatient Hospital Stay (HOSPITAL_COMMUNITY)
Admission: AD | Admit: 2022-12-10 | Discharge: 2022-12-10 | Disposition: A | Discharge status: Home / Self Care | Payer: Medicaid Other | Source: Home / Self Care | Attending: Obstetrics and Gynecology | Admitting: Obstetrics and Gynecology

## 2022-12-10 DIAGNOSIS — O26893 Other specified pregnancy related conditions, third trimester: Secondary | ICD-10-CM | POA: Insufficient documentation

## 2022-12-10 DIAGNOSIS — O368130 Decreased fetal movements, third trimester, not applicable or unspecified: Secondary | ICD-10-CM | POA: Diagnosis not present

## 2022-12-10 DIAGNOSIS — Z3A29 29 weeks gestation of pregnancy: Secondary | ICD-10-CM | POA: Insufficient documentation

## 2022-12-10 DIAGNOSIS — R109 Unspecified abdominal pain: Secondary | ICD-10-CM | POA: Insufficient documentation

## 2022-12-10 DIAGNOSIS — O26899 Other specified pregnancy related conditions, unspecified trimester: Secondary | ICD-10-CM

## 2022-12-10 LAB — URINALYSIS, ROUTINE W REFLEX MICROSCOPIC
Bilirubin Urine: NEGATIVE
Glucose, UA: NEGATIVE mg/dL
Hgb urine dipstick: NEGATIVE
Ketones, ur: 5 mg/dL — AB
Leukocytes,Ua: NEGATIVE
Nitrite: NEGATIVE
Protein, ur: 30 mg/dL — AB
Specific Gravity, Urine: 1.026 (ref 1.005–1.030)
pH: 6 (ref 5.0–8.0)

## 2022-12-10 NOTE — MAU Provider Note (Signed)
Chief Complaint:  Vaginal Discharge, Pelvic Pain, and Decreased Fetal Movement   None     HPI: Angela Calderon is a 22 y.o. G3P1011 at [redacted]w[redacted]d by LMP who presents to maternity admissions reporting pelvic/abdominal cramping and decreased fetal movement. She also reports some thin discharge with no itching, burning, or odor.   She reports good fetal movement, denies LOF, vaginal bleeding, vaginal itching/burning, urinary symptoms, h/a, dizziness, n/v, or fever/chills.     HPI  Past Medical History: Past Medical History:  Diagnosis Date  . Contraception management 09/05/2020  . Gestational hypertension 07/01/2021  . History of miscarriage 11/25/2020  . Itching 05/15/2021   normal bile acids 05/15/21  . Pregnancy induced hypertension   . UTI (urinary tract infection)     Past obstetric history: OB History  Gravida Para Term Preterm AB Living  3 1 1  0 1 1  SAB IAB Ectopic Multiple Live Births  1 0 0 0 1    # Outcome Date GA Lbr Len/2nd Weight Sex Type Anes PTL Lv  3 Current           2 Term 07/16/21 [redacted]w[redacted]d  2560 g M Vag-Spont EPI  LIV  1 SAB             Past Surgical History: Past Surgical History:  Procedure Laterality Date  . TONSILLECTOMY AND ADENOIDECTOMY Bilateral 09/29/2012   Procedure: TONSILLECTOMY AND ADENOIDECTOMY;  Surgeon: Melvenia Beam, MD;  [facility];  Service: ENT;  Laterality: Bilateral;    Family History: Family History  Problem Relation Age of Onset  . Cervical cancer Mother   . Ovarian cancer Paternal Grandmother   . Diabetes Paternal Grandfather   . Congestive Heart Failure Paternal Grandfather     Social History: Social History   Tobacco Use  . Smoking status: Never    Passive exposure: Yes  . Smokeless tobacco: Never  Vaping Use  . Vaping status: Never Used  Substance Use Topics  . Alcohol use: Not Currently  . Drug use: No    Allergies:  Allergies  Allergen Reactions  . Latex Itching    Meds:  Medications Prior to Admission   Medication Sig Dispense Refill Last Dose  . cyclobenzaprine (FLEXERIL) 5 MG tablet Take 1 tablet (5 mg total) by mouth 3 (three) times daily as needed for muscle spasms. 20 tablet 0 Past Week  . sertraline (ZOLOFT) 50 MG tablet Take 2 tablets (100 mg total) by mouth daily. 60 tablet 11 12/09/2022  . acetaminophen (TYLENOL) 325 MG tablet Take 2 tablets (650 mg total) by mouth every 4 (four) hours as needed (for pain scale < 4). 60 tablet 0 More than a month  . aspirin EC 81 MG tablet Take 1 tablet (81 mg total) by mouth daily. Swallow whole. (Patient not taking: Reported on 10/26/2022) 30 tablet 5   . Blood Pressure Monitor DEVI Please check blood pressure once a week. 1 each 0   . cyclobenzaprine (FLEXERIL) 10 MG tablet Take 1 tablet (10 mg total) by mouth 2 (two) times daily as needed for muscle spasms. (Patient not taking: Reported on 11/29/2022) 20 tablet 0   . Doxylamine-Pyridoxine (DICLEGIS) 10-10 MG TBEC Take 2 tabs at bedtime. If needed, add another tab in the morning. If needed, add another tab in the afternoon, up to 4 tabs/day. (Patient not taking: Reported on 09/28/2022) 100 tablet 5   . ondansetron (ZOFRAN-ODT) 4 MG disintegrating tablet Take 1 tablet (4 mg total) by mouth every 6 (six)  hours as needed for nausea. (Patient not taking: Reported on 11/29/2022) 20 tablet 5   . promethazine (PHENERGAN) 12.5 MG suppository Place 1 suppository (12.5 mg total) rectally every 6 (six) hours as needed for nausea or vomiting. (Patient not taking: Reported on 10/26/2022) 12 each 5   . promethazine (PHENERGAN) 12.5 MG tablet Take 1 tablet (12.5 mg total) by mouth every 6 (six) hours as needed for nausea or vomiting. (Patient not taking: Reported on 09/28/2022) 30 tablet 5   . terconazole (TERAZOL 7) 0.4 % vaginal cream Place 1 applicator vaginally at bedtime. (Patient not taking: Reported on 10/26/2022) 45 g 0   . terconazole (TERAZOL 7) 0.4 % vaginal cream Place 1 applicator vaginally at bedtime. (Patient not  taking: Reported on 11/29/2022) 45 g 0     ROS:  Review of Systems   I have reviewed patient's Past Medical Hx, Surgical Hx, Family Hx, Social Hx, medications and allergies.   Physical Exam  Patient Vitals for the past 24 hrs:  BP Temp Temp src Pulse Resp SpO2 Height Weight  12/10/22 1446 128/70 98.5 F (36.9 C) Oral 99 18 99 % 5\' 3"  (1.6 m) 79.2 kg   Constitutional: Well-developed, well-nourished female in no acute distress.  Cardiovascular: normal rate Respiratory: normal effort GI: Abd soft, non-tender, gravid appropriate for gestational age.  MS: Extremities nontender, no edema, normal ROM Neurologic: Alert and oriented x 4.  GU: Neg CVAT.  PELVIC EXAM: Cervix pink, visually closed, without lesion, scant white creamy discharge, vaginal walls and external genitalia normal Bimanual exam: Cervix 0/long/high, firm, anterior, neg CMT, uterus nontender, nonenlarged, adnexa without tenderness, enlargement, or mass     FHT:  Baseline *** , moderate variability, accelerations present, no decelerations Contractions: q *** mins   Labs: No results found for this or any previous visit (from the past 24 hour(s)). --/--/O NEG, O NEG (07/24 2122)  Imaging:  No results found.  MAU Course/MDM: Orders Placed This Encounter  Procedures  . Urinalysis, Routine w reflex microscopic -Urine, Clean Catch    No orders of the defined types were placed in this encounter.    NST reviewed Consult *** with presentation, exam findings and test results.  Treatments in MAU included ***.   Pt discharge with strict *** precautions.    Assessment: 1. Supervision of other normal pregnancy, antepartum     Plan: Discharge home Labor precautions and fetal kick counts  Allergies as of 12/10/2022       Reactions   Latex Itching     Med Rec must be completed prior to using this [facility]***       Sharen Counter Certified Nurse-Midwife 12/10/2022 3:45 PM

## 2022-12-10 NOTE — MAU Note (Signed)
Angela Calderon is a 22 y.o. at [redacted]w[redacted]d here in MAU reporting: has only felt baby move 10 times since last night.  Past 3 days, has been having a lot of lower pelvic pressure. No bleeding or water leaking.  Onset of complaint: 3 days  Pain score: 6 Vitals:   12/10/22 1446  BP: 128/70  Pulse: 99  Resp: 18  Temp: 98.5 F (36.9 C)  SpO2: 99%     FHT:153 Lab orders placed from triage:

## 2022-12-11 ENCOUNTER — Encounter: Payer: Self-pay | Admitting: Advanced Practice Midwife

## 2022-12-21 ENCOUNTER — Telehealth (INDEPENDENT_AMBULATORY_CARE_PROVIDER_SITE_OTHER): Payer: Medicaid Other | Admitting: Advanced Practice Midwife

## 2022-12-21 ENCOUNTER — Encounter: Payer: Medicaid Other | Admitting: Advanced Practice Midwife

## 2022-12-21 DIAGNOSIS — O26893 Other specified pregnancy related conditions, third trimester: Secondary | ICD-10-CM

## 2022-12-21 DIAGNOSIS — R109 Unspecified abdominal pain: Secondary | ICD-10-CM

## 2022-12-21 DIAGNOSIS — O1203 Gestational edema, third trimester: Secondary | ICD-10-CM

## 2022-12-21 DIAGNOSIS — Z3A30 30 weeks gestation of pregnancy: Secondary | ICD-10-CM

## 2022-12-21 NOTE — Telephone Encounter (Signed)
I returned patient's call about her lab results from MAU visit 12/10/22.  Pt with cramping and abdominal pain and has questions about UA results on that date.  She also reports BPs high 130s/80s at home and swelling of hands and feet.  Appt made for pt to return to the office on 9/13 for nurse visit, BP check and to leave a urine for dip and culture. Reviewed s/sx of PTL and reasons to seek care.

## 2022-12-24 ENCOUNTER — Ambulatory Visit: Payer: Medicaid Other | Admitting: *Deleted

## 2022-12-24 VITALS — BP 115/78 | HR 96

## 2022-12-24 DIAGNOSIS — O26893 Other specified pregnancy related conditions, third trimester: Secondary | ICD-10-CM

## 2022-12-24 DIAGNOSIS — R300 Dysuria: Secondary | ICD-10-CM | POA: Diagnosis not present

## 2022-12-24 DIAGNOSIS — Z3480 Encounter for supervision of other normal pregnancy, unspecified trimester: Secondary | ICD-10-CM

## 2022-12-24 DIAGNOSIS — R809 Proteinuria, unspecified: Secondary | ICD-10-CM

## 2022-12-24 LAB — POCT URINALYSIS DIPSTICK
Bilirubin, UA: NEGATIVE
Blood, UA: NEGATIVE
Glucose, UA: NEGATIVE
Ketones, UA: NEGATIVE
Nitrite, UA: NEGATIVE
Odor: NORMAL
Protein, UA: POSITIVE — AB
Spec Grav, UA: <=1.005 — AB (ref 1.010–1.025)
Urobilinogen, UA: 0.2 U/dL
pH, UA: 5 (ref 5.0–8.0)

## 2022-12-24 NOTE — Progress Notes (Signed)
Subjective:  Angela Calderon is a 22 y.o. female here for BP check.   Hypertension ROS: no TIA's, no chest pain on exertion, no dyspnea on exertion, and no swelling of ankles.    Objective:  LMP  (Approximate) Comment: December 2023  Appearance alert, well appearing, and in no distress and oriented to person, place, and time. General exam BP noted to be well controlled today in office.    Assessment:   Blood Pressure well controlled.   Plan:  Current treatment plan is effective, no change in therapy..  SUBJECTIVE: Angela Calderon is a 22 y.o. female who complains of urinary frequency  7 days, without flank pain, fever, chills, or abnormal vaginal discharge or bleeding.   OBJECTIVE: Appears well, in no apparent distress.  Vital signs are normal. Urine dipstick shows positive for leukocytes.    ASSESSMENT: Dysuria  PLAN: Treatment per orders.  Call or return to clinic prn if these symptoms worsen or fail to improve as anticipated.

## 2022-12-26 ENCOUNTER — Inpatient Hospital Stay (HOSPITAL_COMMUNITY)
Admission: AD | Admit: 2022-12-26 | Discharge: 2022-12-26 | Disposition: A | Discharge status: Home / Self Care | Payer: Medicaid Other | Attending: Obstetrics and Gynecology | Admitting: Obstetrics and Gynecology

## 2022-12-26 ENCOUNTER — Other Ambulatory Visit: Payer: Self-pay

## 2022-12-26 ENCOUNTER — Encounter (HOSPITAL_COMMUNITY): Payer: Self-pay | Admitting: Obstetrics and Gynecology

## 2022-12-26 DIAGNOSIS — B3731 Acute candidiasis of vulva and vagina: Secondary | ICD-10-CM | POA: Insufficient documentation

## 2022-12-26 DIAGNOSIS — O23593 Infection of other part of genital tract in pregnancy, third trimester: Secondary | ICD-10-CM | POA: Diagnosis not present

## 2022-12-26 DIAGNOSIS — R102 Pelvic and perineal pain: Secondary | ICD-10-CM | POA: Diagnosis not present

## 2022-12-26 DIAGNOSIS — Z3A31 31 weeks gestation of pregnancy: Secondary | ICD-10-CM | POA: Insufficient documentation

## 2022-12-26 DIAGNOSIS — O98813 Other maternal infectious and parasitic diseases complicating pregnancy, third trimester: Secondary | ICD-10-CM | POA: Insufficient documentation

## 2022-12-26 DIAGNOSIS — B9689 Other specified bacterial agents as the cause of diseases classified elsewhere: Secondary | ICD-10-CM | POA: Diagnosis not present

## 2022-12-26 DIAGNOSIS — O09293 Supervision of pregnancy with other poor reproductive or obstetric history, third trimester: Secondary | ICD-10-CM | POA: Diagnosis not present

## 2022-12-26 DIAGNOSIS — O26893 Other specified pregnancy related conditions, third trimester: Secondary | ICD-10-CM | POA: Insufficient documentation

## 2022-12-26 DIAGNOSIS — Z3480 Encounter for supervision of other normal pregnancy, unspecified trimester: Secondary | ICD-10-CM

## 2022-12-26 LAB — WET PREP, GENITAL
Clue Cells Wet Prep HPF POC: NONE SEEN
Sperm: NONE SEEN
Trich, Wet Prep: NONE SEEN
WBC, Wet Prep HPF POC: <10 (ref <10)

## 2022-12-26 LAB — URINALYSIS, ROUTINE W REFLEX MICROSCOPIC
Bilirubin Urine: NEGATIVE
Glucose, UA: NEGATIVE mg/dL
Hgb urine dipstick: NEGATIVE
Ketones, ur: 15 mg/dL — AB
Leukocytes,Ua: NEGATIVE
Nitrite: NEGATIVE
Protein, ur: NEGATIVE mg/dL
Specific Gravity, Urine: 1.02 (ref 1.005–1.030)
pH: 7 (ref 5.0–8.0)

## 2022-12-26 LAB — RUPTURE OF MEMBRANE (ROM)PLUS: Rom Plus: NEGATIVE

## 2022-12-26 MED ORDER — FLUCONAZOLE 150 MG PO TABS: 150.0000 mg | ORAL_TABLET | Freq: Every day | ORAL | 0 refills | Status: DC

## 2022-12-26 NOTE — MAU Provider Note (Signed)
History     CSN: [id number]  Arrival date and time: 12/26/22 1332   None     Chief Complaint  Patient presents with   Pelvic Pain   HPI Angela Calderon is a 22 y.o. G3P1011 at [redacted]w[redacted]d who presents today with complaints of ongoing vaginal discharge and pelvic pain. Pain has been ongoing for two weeks or so, but persistently worsening. She describes pain worse after brushing her teeth, when she has to step down -- reports feeling the need to limp/waddle for the remainder of the day. She describes some of the discomfort as almost menstrual cramps that radiate to her introitus. She also endorses whitish to neon green discharge. This has been an ongoing problem for her and she wonders if some of her discomfort is secondary to discharge and an underlying infection. She was recently treated for vulvovaginal candidiasis with Terconazole without relief of symptoms and discharge never improved. No fevers, chills, nausea, vomiting, other abdominal pain, dysuria, urinary urgency, frequency. No vaginal bleeding, LOF, ctx. She endorses good FM.   Past Medical History:  Diagnosis Date   Contraception management 09/05/2020   Gestational hypertension 07/01/2021   History of miscarriage 11/25/2020   Itching 05/15/2021   normal bile acids 05/15/21   Pregnancy induced hypertension    UTI (urinary tract infection)     Past Surgical History:  Procedure Laterality Date   TONSILLECTOMY AND ADENOIDECTOMY Bilateral 09/29/2012   Procedure: TONSILLECTOMY AND ADENOIDECTOMY;  Surgeon: Melvenia Beam, MD;  Location: [facility] OR;  Service: ENT;  Laterality: Bilateral;    Family History  Problem Relation Age of Onset   Cervical cancer Mother    Ovarian cancer Paternal Grandmother    Diabetes Paternal Grandfather    Congestive Heart Failure Paternal Grandfather     Social History   Tobacco Use   Smoking status: Never    Passive exposure: Yes   Smokeless tobacco: Never  Vaping Use   Vaping status: Never Used   Substance Use Topics   Alcohol use: Not Currently   Drug use: No    Allergies:  Allergies  Allergen Reactions   Latex Itching    No medications prior to admission.   ROS performed and pertinent positives and negatives as written in HPI. Physical Exam   Blood pressure 117/71, pulse 88, temperature 98 F (36.7 C), temperature source Oral, resp. rate 18, height 5\' 3"  (1.6 m), weight 79.5 kg, SpO2 97%, not currently breastfeeding.  Physical Exam Exam conducted with a chaperone present.  Constitutional:      General: She is not in acute distress.    Appearance: Normal appearance.  HENT:     Head: Normocephalic and atraumatic.  Cardiovascular:     Rate and Rhythm: Normal rate and regular rhythm.     Heart sounds: Normal heart sounds.  Pulmonary:     Effort: Pulmonary effort is normal. No respiratory distress.     Breath sounds: Normal breath sounds.  Abdominal:     General: There is no distension.     Palpations: Abdomen is soft.     Tenderness: There is no abdominal tenderness. There is no right CVA tenderness or left CVA tenderness.  Genitourinary:    Pubic Area: No rash.      Labia:        Right: No rash or lesion.        Left: No rash or lesion.      Vagina: Vaginal discharge present. No erythema or lesions.  Cervix: Discharge (greenish white chunky discharge noted coming from os) and lesion (small clustered pustule appearing lesions with erythematous base noted between 4:00-6:00 near os, similar lesions noted around 9:00 near os; no bleeding from os) present.  Musculoskeletal:        General: Normal range of motion.  Skin:    General: Skin is warm and dry.     Findings: No rash.  Neurological:     General: No focal deficit present.     Mental Status: She is alert and oriented to person, place, and time.    EFM: 140/mod/+a/-d  MAU Course  Procedures  MDM 22 y.o. G3P1011 at [redacted]w[redacted]d who presents today with complaints of pelvic pain and ongoing vaginal  discharge. Pelvic pain is worse with certain movements, appears c/w pelvic girdle pain/discomfort -- will send to pelvic floor PT -- rec conservative measures (heat, Tylenol, stretching). Vaginal discharge c/w yeast, which pt has failed topical therapy for so would consider Diflucan. Lesions on cervix c/f HSV vs transformation zone vs HPV lesion vs normal variant. Collected HSV PCR to send off, GC/CT, ROMplus (given also c/o some clear/watery discharge). Her exam is otherwise benign. Will await swabs.  4:57 PM GC/CT and HSV still pending, ROM plus negative, wet prep positive for yeast. Given previous failure of topical therapy, will given a dose of Diflucan. Pelvic pain likely due to irritation from girdle pain/MSK type pain vs some of it may also be attributed to vaginitis. Discussed results in detail with patient. Stable for d/c.  Assessment and Plan  Supervision of other normal pregnancy, antepartum  Pain of pelvic girdle - Plan: Discharge patient, Ambulatory referral to Physical Therapy  Vulvovaginal candidiasis - Plan: Discharge patient  Patient discussed with Dr. Alysia Penna.  Sundra Aland, MD OB Fellow, Faculty Practice [place], Center for [facility]  12/26/2022, 4:54 PM

## 2022-12-26 NOTE — MAU Note (Signed)
Angela Calderon is a 22 y.o. at [redacted]w[redacted]d here in MAU reporting: really bad pelvic pain since yesterday.  Reports pain is intermittent, "it fades then comes back."  Also reports constant pain in lower back that's aching and dull.  Denies VB, endorses watery discharge.  Endorses +FM.  Reports took Tylenol this morning @ 1100, no relief noted. LMP: NA Onset of complaint: yesterday Pain score: 9 Vitals:   12/26/22 1346  BP: 128/66  Pulse: 97  Resp: 18  Temp: 97.9 F (36.6 C)  SpO2: 99%     FHT:148 bpm Lab orders placed from triage:   UA

## 2022-12-27 LAB — CULTURE, OB URINE

## 2022-12-27 LAB — HSV 1/2 PCR (SURFACE)
HSV-1 DNA: NOT DETECTED
HSV-2 DNA: NOT DETECTED

## 2022-12-27 LAB — GC/CHLAMYDIA PROBE AMP (~~LOC~~) NOT AT ARMC
Chlamydia: NEGATIVE
Comment: NEGATIVE
Comment: NORMAL
Neisseria Gonorrhea: NEGATIVE

## 2022-12-27 LAB — URINE CULTURE, OB REFLEX

## 2022-12-28 ENCOUNTER — Ambulatory Visit: Payer: Medicaid Other | Attending: Obstetrics and Gynecology

## 2022-12-28 DIAGNOSIS — O360130 Maternal care for anti-D [Rh] antibodies, third trimester, not applicable or unspecified: Secondary | ICD-10-CM

## 2022-12-28 DIAGNOSIS — Z8759 Personal history of other complications of pregnancy, childbirth and the puerperium: Secondary | ICD-10-CM | POA: Insufficient documentation

## 2022-12-28 DIAGNOSIS — O09293 Supervision of pregnancy with other poor reproductive or obstetric history, third trimester: Secondary | ICD-10-CM | POA: Diagnosis not present

## 2022-12-28 DIAGNOSIS — O4443 Low lying placenta NOS or without hemorrhage, third trimester: Secondary | ICD-10-CM

## 2022-12-28 DIAGNOSIS — Z3A31 31 weeks gestation of pregnancy: Secondary | ICD-10-CM | POA: Diagnosis not present

## 2022-12-31 ENCOUNTER — Ambulatory Visit: Payer: Medicaid Other

## 2023-01-03 ENCOUNTER — Encounter: Payer: Self-pay | Admitting: Advanced Practice Midwife

## 2023-01-03 ENCOUNTER — Telehealth (INDEPENDENT_AMBULATORY_CARE_PROVIDER_SITE_OTHER): Payer: Medicaid Other | Admitting: Advanced Practice Midwife

## 2023-01-03 DIAGNOSIS — R102 Pelvic and perineal pain: Secondary | ICD-10-CM | POA: Insufficient documentation

## 2023-01-03 DIAGNOSIS — R030 Elevated blood-pressure reading, without diagnosis of hypertension: Secondary | ICD-10-CM

## 2023-01-03 DIAGNOSIS — Z3480 Encounter for supervision of other normal pregnancy, unspecified trimester: Secondary | ICD-10-CM

## 2023-01-03 DIAGNOSIS — Z3A32 32 weeks gestation of pregnancy: Secondary | ICD-10-CM

## 2023-01-03 NOTE — Progress Notes (Signed)
OBSTETRICS PRENATAL VIRTUAL VISIT ENCOUNTER NOTE  Provider [facility]: [facility]   Patient location: Home  I connected with Angela Calderon on 01/03/23 at 10:55 AM EDT by MyChart Video Encounter and verified that I am speaking with the correct person using two identifiers. I discussed the limitations, risks, security and privacy concerns of performing an evaluation and management service virtually and the availability of in person appointments. I also discussed with the patient that there may be a patient responsible charge related to this service. The patient expressed understanding and agreed to proceed. Subjective:  Angela Calderon is a 22 y.o. G3P1011 at [redacted]w[redacted]d being seen today for ongoing prenatal care.  She is currently monitored for the following issues for this low-risk pregnancy and has Rh negative state in antepartum period; Supervision of other normal pregnancy, antepartum; Low lying placenta, antepartum; and History of gestational hypertension on their problem list.  Patient reports  pelvic pain, swelling of hands and feet, and BP over the weekend of 140s/90s .  Contractions: Irritability. Vag. Bleeding: None.  Movement: Present. Denies any leaking of fluid.   The following portions of the patient's history were reviewed and updated as appropriate: allergies, current medications, past family history, past medical history, past social history, past surgical history and problem list.   Objective:  There were no vitals filed for this visit.  Fetal Status:     Movement: Present     General:  Alert, oriented and cooperative. Patient is in no acute distress.  Respiratory: Normal respiratory effort, no problems with respiration noted  Mental Status: Normal mood and affect. Normal behavior. Normal judgment and thought content.  Rest of physical exam deferred due to type of encounter  Imaging:  Assessment and Plan:  Pregnancy: G3P1011 at [redacted]w[redacted]d 1.  Supervision of other normal pregnancy, antepartum --Pt reports good fetal movement, denies cramping, LOF, or vaginal bleeding  --Anticipatory guidance about next visits/weeks of pregnancy given.  - Enroll Patient in PreNatal Babyscripts  2. Pain of pelvic girdle --Seen in MAU 9/15.  PT ordered. Pt is doing chiropractic, working full time, has toddler.  Unsure if she can go to appts for PT. She is doing exercises discussed in last office visit. --Pt encouraged to try to get to PT, but if not to try exercises discussed at home and look for other exercises specific to pelvic girdle pain.  --Offered to adjust work hours, but pt does not want to limit work right now  3. Elevated blood pressure reading without diagnosis of hypertension --Pt BPs elevated at home a few times in the pregnancy, retake in office wnl --Pt unable to log onto Babyscripts, has tried to troubleshoot but not working --Will resent [employer] now, pt to try from computer and not her phone to see if she can get registered, then try again on her phone --If unable to get into Babyscripts, pt to send MyChart message with BP today and anytime BP is elevated. --S/sx of PEC, reasons to go to MAU reviewed.   4. [redacted] weeks gestation of pregnancy   Preterm labor symptoms and general obstetric precautions including but not limited to vaginal bleeding, contractions, leaking of fluid and fetal movement were reviewed in detail with the patient. I discussed the assessment and treatment plan with the patient. The patient was provided an opportunity to ask questions and all were answered. The patient agreed with the plan and demonstrated an understanding of the instructions. The patient was advised to  call back or seek an in-person office evaluation/go to MAU at [facility] for any urgent or concerning symptoms. Please refer to After Visit Summary for other counseling recommendations.   I provided 10 minutes of face-to-face  time during this encounter.  Return for Schedule next visit in 2 weeks virtual and schedule 4 weeks (36 week visit ) in person, with me .  No future appointments.   Sharen Counter, CNM Center for [employer], [facility]

## 2023-01-03 NOTE — Progress Notes (Signed)
Pt presents for virtual ROB visit. Pt states BP of 142/90 this weekend. Unable to check BP now. Pt c/o increased swelling in the hands and feet and pelvic pain.

## 2023-01-04 ENCOUNTER — Other Ambulatory Visit: Payer: Self-pay | Admitting: Advanced Practice Midwife

## 2023-01-04 DIAGNOSIS — R030 Elevated blood-pressure reading, without diagnosis of hypertension: Secondary | ICD-10-CM

## 2023-01-05 ENCOUNTER — Other Ambulatory Visit: Payer: Self-pay

## 2023-01-05 DIAGNOSIS — B379 Candidiasis, unspecified: Secondary | ICD-10-CM

## 2023-01-05 MED ORDER — TERCONAZOLE 0.8 % VA CREA
1.0000 | TOPICAL_CREAM | Freq: Every day | VAGINAL | 0 refills | Status: DC
Start: 2023-01-05 — End: 2023-01-05

## 2023-01-07 ENCOUNTER — Ambulatory Visit (INDEPENDENT_AMBULATORY_CARE_PROVIDER_SITE_OTHER): Payer: Medicaid Other | Admitting: General Practice

## 2023-01-07 ENCOUNTER — Other Ambulatory Visit: Payer: Medicaid Other

## 2023-01-07 VITALS — BP 113/74 | HR 94 | Wt 173.7 lb

## 2023-01-07 DIAGNOSIS — R030 Elevated blood-pressure reading, without diagnosis of hypertension: Secondary | ICD-10-CM

## 2023-01-07 NOTE — Progress Notes (Signed)
Subjective:  Angela Calderon is a 22 y.o. female here for BP check.   Hypertension ROS: home BP monitoring in range of 140's systolic over 90's diastolic, no TIA's, no chest pain on exertion, no dyspnea on exertion, and no swelling of ankles.    Objective:  LMP  (Approximate) Comment: December 2023  Appearance alert, well appearing, and in no distress and oriented to person, place, and time. General exam BP noted to be well controlled today in office.    Assessment:   Blood Pressure well controlled.   Plan:  Current treatment plan is effective, no change in therapy. Orders and follow up as documented in patient record..  PCR, CMP and CBC done today per Sharen Counter, CNM. Will discuss further treatment once lab results are received.

## 2023-01-08 LAB — PROTEIN / CREATININE RATIO, URINE
Creatinine, Urine: 59.4 mg/dL
Protein, Ur: 8.8 mg/dL
Protein/Creat Ratio: 148 mg/g{creat} (ref 0–200)

## 2023-01-08 LAB — COMPREHENSIVE METABOLIC PANEL
ALT: 12 [IU]/L (ref 0–32)
AST: 15 [IU]/L (ref 0–40)
Albumin: 3.6 g/dL — ABNORMAL LOW (ref 4.0–5.0)
Alkaline Phosphatase: 169 [IU]/L — ABNORMAL HIGH (ref 44–121)
BUN/Creatinine Ratio: 9 (ref 9–23)
BUN: 5 mg/dL — ABNORMAL LOW (ref 6–20)
Bilirubin Total: 0.3 mg/dL (ref 0.0–1.2)
CO2: 20 mmol/L (ref 20–29)
Calcium: 8.9 mg/dL (ref 8.7–10.2)
Chloride: 103 mmol/L (ref 96–106)
Creatinine, Ser: 0.56 mg/dL — ABNORMAL LOW (ref 0.57–1.00)
Globulin, Total: 2.5 g/dL (ref 1.5–4.5)
Glucose: 96 mg/dL (ref 70–99)
Potassium: 4.5 mmol/L (ref 3.5–5.2)
Sodium: 135 mmol/L (ref 134–144)
Total Protein: 6.1 g/dL (ref 6.0–8.5)
eGFR: 131 mL/min/{1.73_m2} (ref >59)

## 2023-01-08 LAB — CBC
Hematocrit: 30.9 % — ABNORMAL LOW (ref 34.0–46.6)
Hemoglobin: 10.1 g/dL — ABNORMAL LOW (ref 11.1–15.9)
MCH: 29.4 pg (ref 26.6–33.0)
MCHC: 32.7 g/dL (ref 31.5–35.7)
MCV: 90 fL (ref 79–97)
Platelets: 359 10*3/uL (ref 150–450)
RBC: 3.43 x10E6/uL — ABNORMAL LOW (ref 3.77–5.28)
RDW: 12.8 % (ref 11.7–15.4)
WBC: 10.8 10*3/uL (ref 3.4–10.8)

## 2023-01-09 IMAGING — US US MFM OB FOLLOW-UP
1 series · 13 of 28 positions shown · non-contrast
Comparison: none

[Series 1: us mfm ob follow-up · 49 acquisitions, 13 frames shown]
[im 2/49]
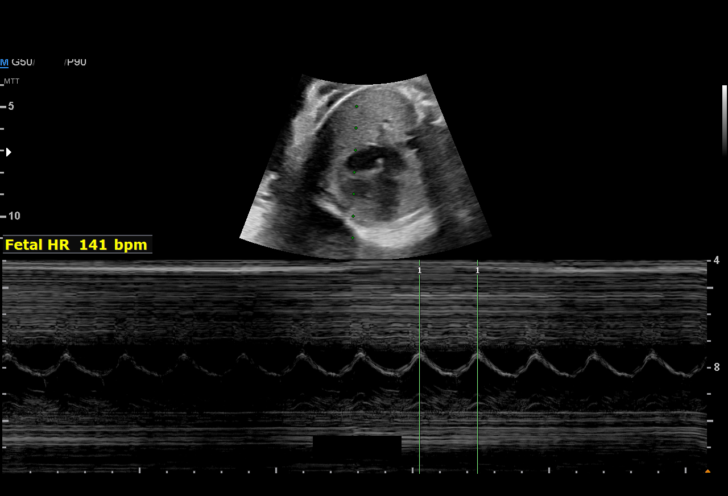
[im 6/49]
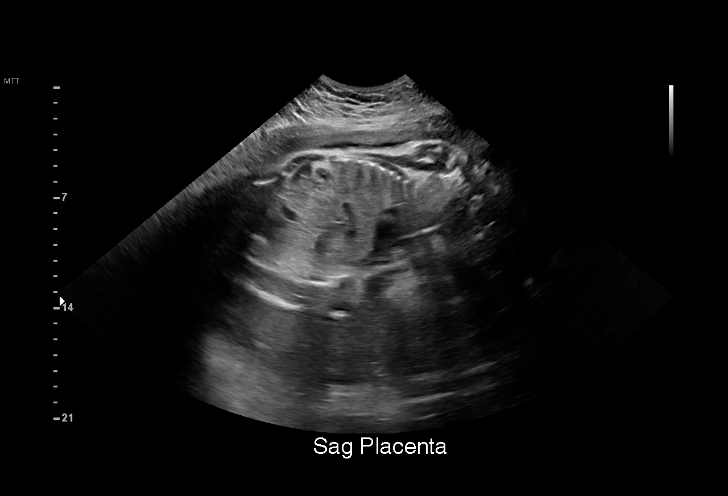
[im 9/49]
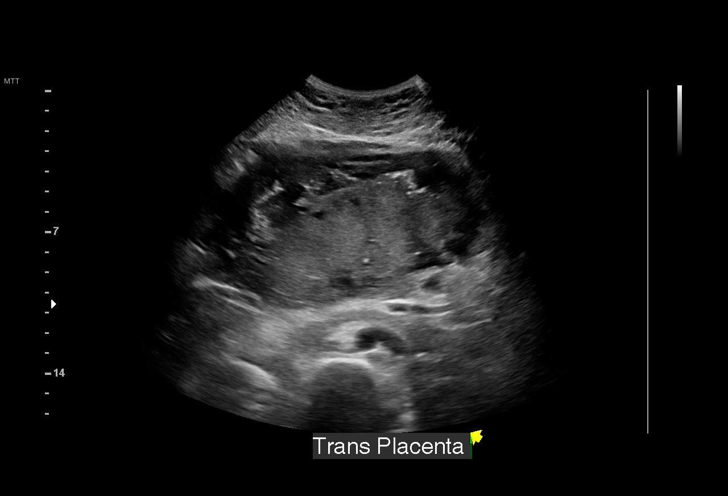
[im 13/49]
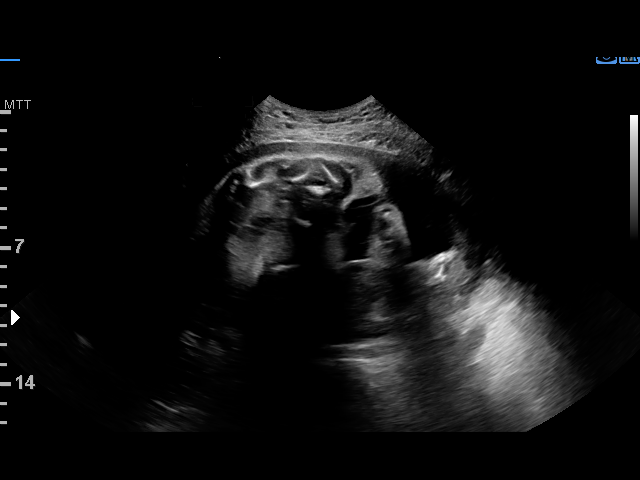
[im 17/49]
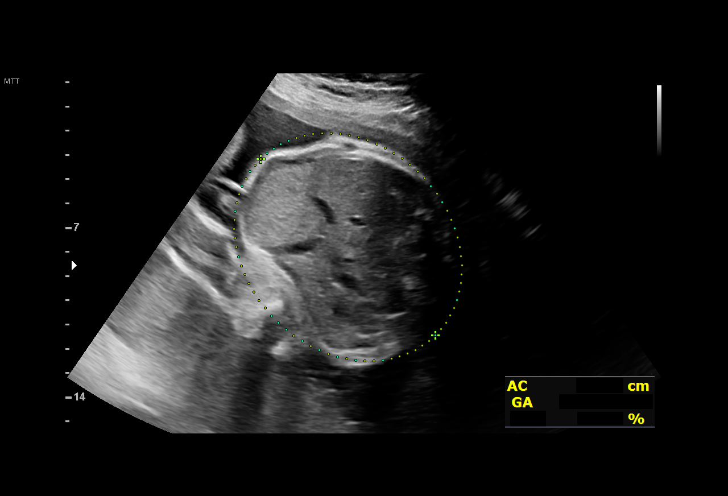
[im 20/49]
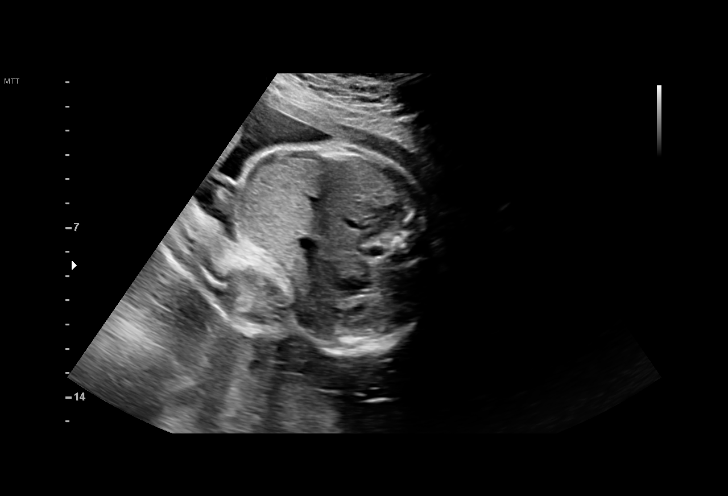
[im 25/49]
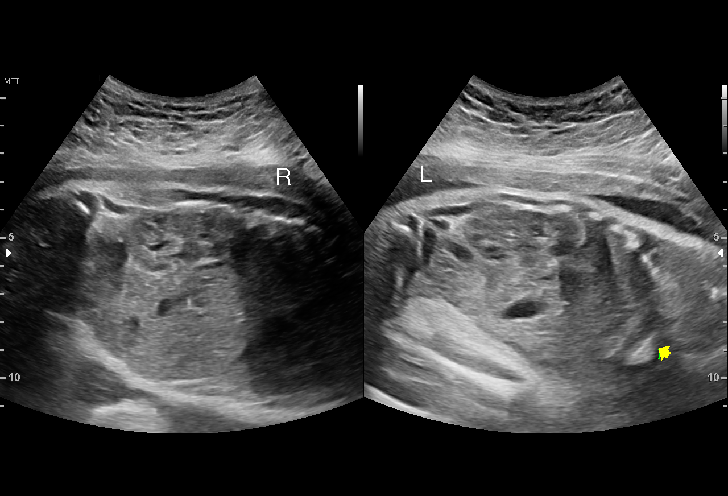
[im 29/49]
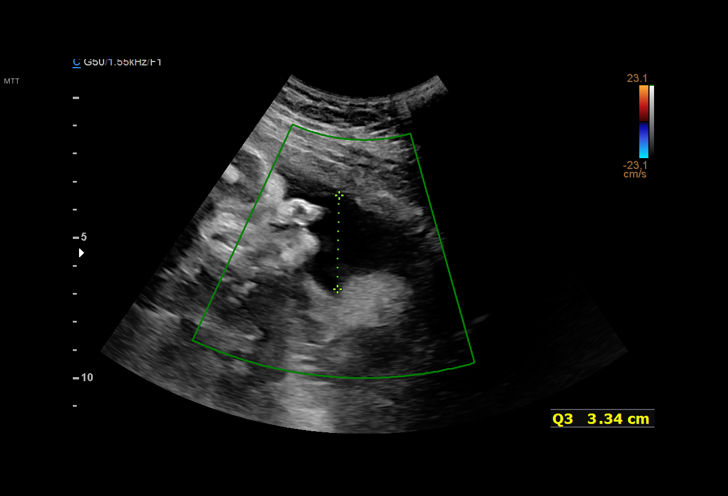
[im 33/49]
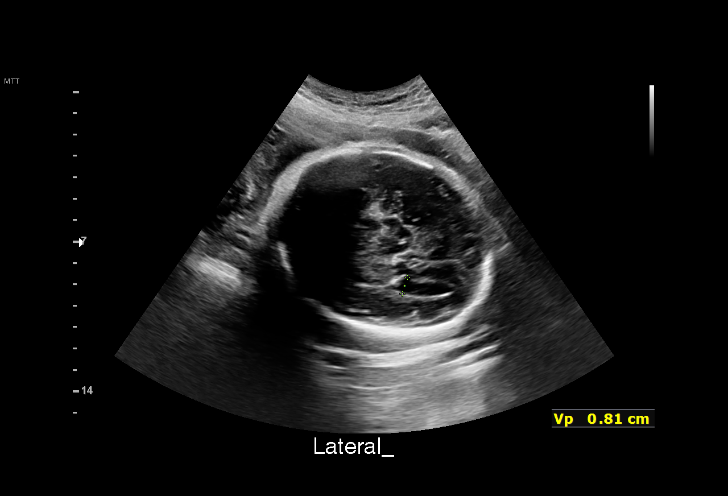
[im 36/49]
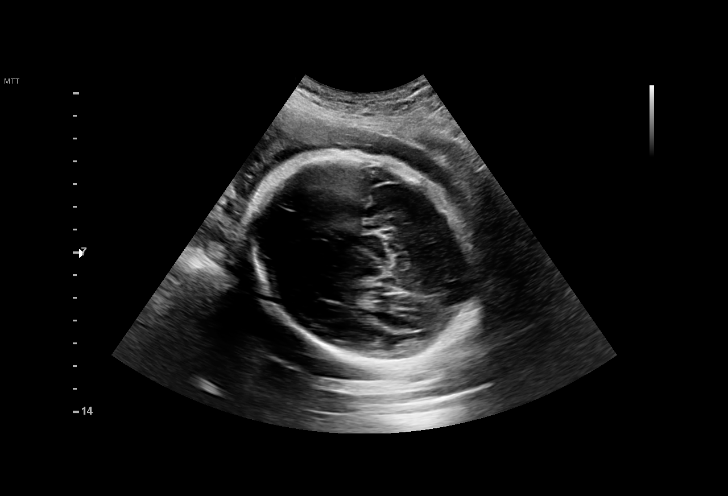
[im 40/49]
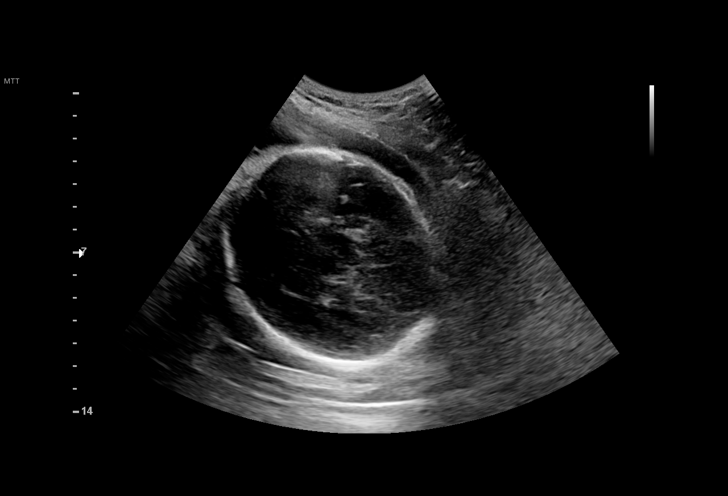
[im 43/49]
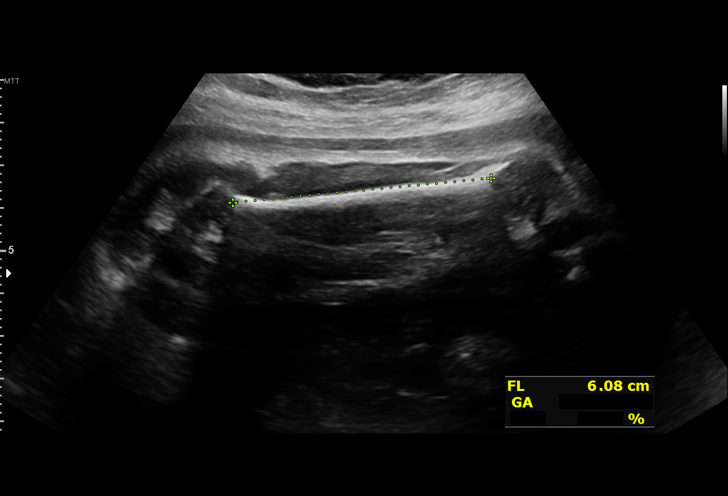
[im 47/49]
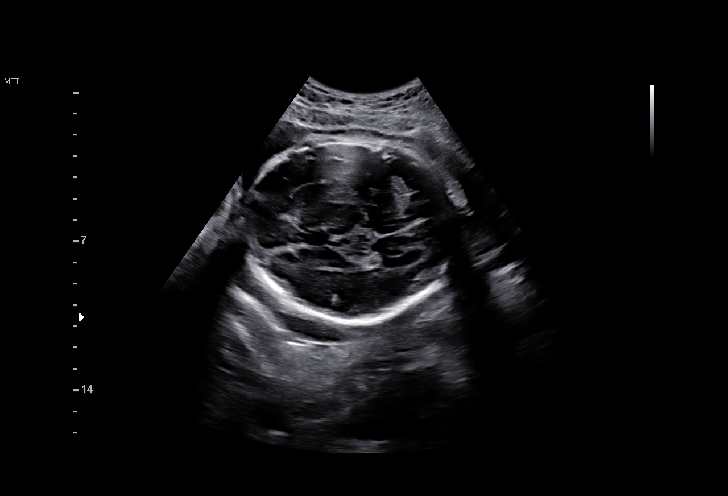

[13 of 28 positions shown; findings below may reference images not displayed]

Name:       DORNU SANDY              Visit Date: 06/09/2021 [REDACTED]

Indications

 Size-Date Discrepancy
 31 weeks gestation of pregnancy
 Medical complication of pregnancy (COVID
 + [DATE])
 LR NIPS - Male, Neg Horizon, Neg AFP
 Encounter for other antenatal screening
 follow-up
Fetal Evaluation

 Num Of Fetuses:         1
 Fetal Heart Rate(bpm):  141
 Cardiac Activity:       Observed
 Presentation:           Cephalic
 Placenta:               Posterior
 P. Cord Insertion:      Previously Visualized

 Amniotic Fluid
 AFI FV:      Within normal limits

 AFI Sum(cm)     %Tile       Largest Pocket(cm)
 16.2            58

 RUQ(cm)       RLQ(cm)       LUQ(cm)        LLQ(cm)

Biometry
 BPD:      83.7  mm     G. Age:  33w 5d         89  %    CI:        76.36   %    70 - 86
                                                         FL/HC:      20.1   %    19.1 -
 HC:      303.5  mm     G. Age:  33w 5d         64  %    HC/AC:      1.06        0.96 -
 AC:      286.2  mm     G. Age:  32w 4d         71  %    FL/BPD:     72.8   %    71 - 87
 FL:       60.9  mm     G. Age:  31w 4d         31  %    FL/AC:      21.3   %    20 - 24
 LV:        8.1  mm

 Est. FW:    0555  gm      4 lb 6 oz     62  %
OB History

 Gravidity:    2          SAB:   1
 Living:       0
Gestational Age

 LMP:           31w 6d        Date:  03/03/21                 EDD:   10/29/20
 U/S Today:     32w 6d                                        EDD:   08/05/21
 Best:          31w 6d     Det. By:  LMP  (07/29/21)          EDD:   10/29/20
Anatomy

 Cranium:               Appears normal         LVOT:                   Previously seen
 Cavum:                 Appears normal         Aortic Arch:            Previously seen
 Ventricles:            Appears normal         Ductal Arch:            Previously seen
 Choroid Plexus:        Previously seen        Diaphragm:              Appears normal
 Cerebellum:            Previously seen        Stomach:                Appears normal, left
                                                                       sided
 Posterior Fossa:       Previously seen        Abdomen:                Previously seen
 Nuchal Fold:           Not applicable (>20    Abdominal Wall:         Previously seen
                        wks GA)
 Face:                  Orbits and profile     Cord Vessels:           Previously seen
                        previously seen
 Lips:                  Previously seen        Kidneys:                Appear normal
 Palate:                Previously seen        Bladder:                Appears normal
 Thoracic:              Appears normal         Spine:                  Previously seen
 Heart:                 Appears normal         Upper Extremities:      Previously seen
                        (4CH, axis, and
                        situs)
 RVOT:                  Previously seen        Lower Extremities:      Previously seen

 Other:  Male gender previously seen. VC, 3VV, 3VTV, Nasal bone, lenses,
         Heels/feet and open hands/5th digits previously visualized.
         Technically difficult due to advanced GA and fetal position.
Cervix Uterus Adnexa

 Cervix
 Not visualized (advanced GA >26wks)

 Uterus
 No abnormality visualized.

 Right Ovary
 Within normal limits.
 Left Ovary
 Within normal limits.

 Cul De Sac
 No free fluid seen.

 Adnexa
 No abnormality visualized.
Comments

 This patient was seen for a follow up growth scan as her
 fundal heights were measuring 3 weeks behind her dates.
 She denies any other problems in her current pregnancy and
 has screened negative for gestational diabetes.
 She was informed that the fetal growth and amniotic fluid
 level appears appropriate for her gestational age.  The overall
 EFW measures at the 62nd percentile for her gestational age.
 As her fundal heights are measuring less than her dates, a
 follow-up growth scan was scheduled in 4 weeks.

## 2023-01-12 ENCOUNTER — Other Ambulatory Visit: Payer: Self-pay

## 2023-01-12 DIAGNOSIS — B379 Candidiasis, unspecified: Secondary | ICD-10-CM

## 2023-01-12 MED ORDER — FLUCONAZOLE 150 MG PO TABS: 150.0000 mg | ORAL_TABLET | Freq: Every day | ORAL | 0 refills | Status: DC

## 2023-01-18 ENCOUNTER — Encounter: Payer: Self-pay | Admitting: Advanced Practice Midwife

## 2023-01-24 ENCOUNTER — Encounter: Payer: Self-pay | Admitting: Advanced Practice Midwife

## 2023-01-24 ENCOUNTER — Telehealth: Payer: Medicaid Other | Admitting: Advanced Practice Midwife

## 2023-01-24 VITALS — BP 143/90

## 2023-01-24 DIAGNOSIS — Z3480 Encounter for supervision of other normal pregnancy, unspecified trimester: Secondary | ICD-10-CM

## 2023-01-24 DIAGNOSIS — O133 Gestational [pregnancy-induced] hypertension without significant proteinuria, third trimester: Secondary | ICD-10-CM | POA: Insufficient documentation

## 2023-01-24 DIAGNOSIS — Z3A35 35 weeks gestation of pregnancy: Secondary | ICD-10-CM

## 2023-01-24 NOTE — Progress Notes (Signed)
   PRENATAL VISIT NOTE  Subjective:  Angela Calderon is a 22 y.o. G3P1011 at [redacted]w[redacted]d being seen today for ongoing prenatal care.  She is currently monitored for the following issues for this low-risk pregnancy and has Rh negative state in antepartum period; Supervision of other normal pregnancy, antepartum; Low lying placenta, antepartum; History of gestational hypertension; and Pain of pelvic girdle on their problem list.  Patient reports no complaints.  Contractions: Irritability. Vag. Bleeding: None.  Movement: Present. Denies leaking of fluid.   The following portions of the patient's history were reviewed and updated as appropriate: allergies, current medications, past family history, past medical history, past social history, past surgical history and problem list.   Objective:   Vitals:   01/24/23 1410  BP: (!) 143/90   BP at home/work: 01/17/23:   152/91                               01/20/23:  137/92                                10/14//24: 143/90   Fetal Status:     Movement: Present     General:  Alert, oriented and cooperative. Patient is in no acute distress.  Skin: Skin is warm and dry. No rash noted.   Cardiovascular: Normal heart rate noted  Respiratory: Normal respiratory effort, no problems with respiration noted  Abdomen: Soft, gravid, appropriate for gestational age.  Pain/Pressure: Present     Pelvic: Cervical exam deferred        Extremities: Normal range of motion.  Edema: Trace  Mental Status: Normal mood and affect. Normal behavior. Normal judgment and thought content.   Assessment and Plan:  Pregnancy: G3P1011 at [redacted]w[redacted]d 1. Supervision of other normal pregnancy, antepartum --Pt reports good fetal movement, denies cramping, LOF, or vaginal bleeding  --Anticipatory guidance about next visits/weeks of pregnancy given.   2. Gestational hypertension, third trimester --Pt with persistently elevated BPs at home and at work.  BP checks in the office have been  wnl.  Home cuff and work cuff are consistent.  H/a and some visual changes, resolve easily and vision changes with sparkling, not true scotoma.  Warning signs/reasons to go to MAU reviewed. --BP 10/7 152/91, 10/10 147/92, 10/14 143/90.   --In office visit next week for BP check --IOL at 37 weeks, scheduled, orders placed - Reviewed s/sx of PEC  3. [redacted] weeks gestation of pregnancy   Preterm labor symptoms and general obstetric precautions including but not limited to vaginal bleeding, contractions, leaking of fluid and fetal movement were reviewed in detail with the patient. Please refer to After Visit Summary for other counseling recommendations.   Return for As scheduled.  Future Appointments  Date Time Provider Department Center  02/01/2023 11:15 AM Leftwich-Kirby, Wilmer Floor, CNM [facility] None  02/05/2023 12:00 AM [facility] SCHED ROOM [facility] None    Sharen Counter, CNM

## 2023-01-27 ENCOUNTER — Inpatient Hospital Stay (HOSPITAL_COMMUNITY)
Admission: AD | Admit: 2023-01-27 | Discharge: 2023-01-27 | Disposition: A | Discharge status: Home / Self Care | Payer: Medicaid Other | Attending: Obstetrics & Gynecology | Admitting: Obstetrics & Gynecology

## 2023-01-27 ENCOUNTER — Encounter (HOSPITAL_COMMUNITY): Payer: Self-pay | Admitting: Obstetrics & Gynecology

## 2023-01-27 DIAGNOSIS — B9689 Other specified bacterial agents as the cause of diseases classified elsewhere: Secondary | ICD-10-CM | POA: Insufficient documentation

## 2023-01-27 DIAGNOSIS — O4703 False labor before 37 completed weeks of gestation, third trimester: Secondary | ICD-10-CM | POA: Insufficient documentation

## 2023-01-27 DIAGNOSIS — O23593 Infection of other part of genital tract in pregnancy, third trimester: Secondary | ICD-10-CM | POA: Insufficient documentation

## 2023-01-27 DIAGNOSIS — Z0371 Encounter for suspected problem with amniotic cavity and membrane ruled out: Secondary | ICD-10-CM

## 2023-01-27 DIAGNOSIS — N760 Acute vaginitis: Secondary | ICD-10-CM | POA: Insufficient documentation

## 2023-01-27 DIAGNOSIS — Z3A35 35 weeks gestation of pregnancy: Secondary | ICD-10-CM | POA: Insufficient documentation

## 2023-01-27 DIAGNOSIS — O09293 Supervision of pregnancy with other poor reproductive or obstetric history, third trimester: Secondary | ICD-10-CM | POA: Insufficient documentation

## 2023-01-27 LAB — WET PREP, GENITAL
Sperm: NONE SEEN
Trich, Wet Prep: NONE SEEN
WBC, Wet Prep HPF POC: >=10 — AB (ref <10)
Yeast Wet Prep HPF POC: NONE SEEN

## 2023-01-27 LAB — RUPTURE OF MEMBRANE (ROM)PLUS: Rom Plus: NEGATIVE

## 2023-01-27 LAB — POCT FERN TEST: POCT Fern Test: NEGATIVE

## 2023-01-27 MED ORDER — METRONIDAZOLE 500 MG PO TABS
500.0000 mg | ORAL_TABLET | Freq: Once | ORAL | Status: AC
  Administered 2023-01-27: 500 mg via ORAL
  Filled 2023-01-27: qty 1

## 2023-01-27 MED ORDER — METRONIDAZOLE 500 MG PO TABS: 500.0000 mg | ORAL_TABLET | Freq: Two times a day (BID) | ORAL | 0 refills | Status: AC

## 2023-01-27 NOTE — MAU Note (Signed)
..  Angela Calderon is a 22 y.o. at [redacted]w[redacted]d here in MAU reporting: around 1200 she had a gush of clear watery fluid. Has continued to leak out since then. Denies VB. Ctx every 10 minutes. +FM.    Pain score: 7 Vitals:   01/27/23 1607  BP: 127/71  Pulse: (!) 102  Resp: 14  Temp: 98.2 F (36.8 C)  SpO2: 100%     FHT:142 Lab orders placed from triage:   fern

## 2023-01-27 NOTE — MAU Provider Note (Signed)
Chief Complaint: Rupture of Membranes and Contractions   Event Date/Time   First Provider Initiated Contact with Patient 01/27/23 1659      SUBJECTIVE HPI: Angela Calderon is a 22 y.o. G3P1011 at [redacted]w[redacted]d by early ultrasound who presents to maternity admissions reporting concern for ROM.  Patient notes a trickle of clear fluid around noon. Has since continued to feel damp and leak since. No vaginal itching, VB, fever/chills, urinary symptoms. Rare cramping. +FM.  HPI  Past Medical History:  Diagnosis Date   Contraception management 09/05/2020   Gestational hypertension 07/01/2021   History of miscarriage 11/25/2020   Itching 05/15/2021   normal bile acids 05/15/21   Pregnancy induced hypertension    UTI (urinary tract infection)    Past Surgical History:  Procedure Laterality Date   TONSILLECTOMY AND ADENOIDECTOMY Bilateral 09/29/2012   Procedure: TONSILLECTOMY AND ADENOIDECTOMY;  Surgeon: Melvenia Beam, MD;  Location: [facility] OR;  Service: ENT;  Laterality: Bilateral;   Social History   Socioeconomic History   Marital status: Single    Spouse name: Not on file   Number of children: Not on file   Years of education: Not on file   Highest education level: Not on file  Occupational History   Not on file  Tobacco Use   Smoking status: Never    Passive exposure: Yes   Smokeless tobacco: Never  Vaping Use   Vaping status: Never Used  Substance and Sexual Activity   Alcohol use: Not Currently   Drug use: No   Sexual activity: Yes    Partners: Male    Comment: last ic 1 week ago  Other Topics Concern   Not on file  Social History Narrative   ** Merged History Encounter **       Social Determinants of Health   Financial Resource Strain: Not on file  Food Insecurity: Not on file  Transportation Needs: Not on file  Physical Activity: Not on file  Stress: Not on file  Social Connections: Unknown (08/25/2021)   Received from [facility], [facility]   Social Network     Social Network: Not on file  Intimate Partner Violence: Not At Risk (09/09/2022)   Received from [facility], [facility]   HITS    Over the last 12 months how often did your partner physically hurt you?: 1    Over the last 12 months how often did your partner insult you or talk down to you?: 1    Over the last 12 months how often did your partner threaten you with physical harm?: 1    Over the last 12 months how often did your partner scream or curse at you?: 1   No current facility-administered medications on file prior to encounter.   Current Outpatient Medications on File Prior to Encounter  Medication Sig Dispense Refill   aspirin EC 81 MG tablet Take 1 tablet (81 mg total) by mouth daily. Swallow whole. 30 tablet 5   ondansetron (ZOFRAN-ODT) 4 MG disintegrating tablet Take 4 mg by mouth every 8 (eight) hours as needed for nausea or vomiting.     sertraline (ZOLOFT) 50 MG tablet Take 2 tablets (100 mg total) by mouth daily. 60 tablet 11   acetaminophen (TYLENOL) 325 MG tablet Take 2 tablets (650 mg total) by mouth every 4 (four) hours as needed (for pain scale < 4). 60 tablet 0   Blood Pressure Monitor DEVI Please check blood pressure once a week. 1 each 0  cyclobenzaprine (FLEXERIL) 10 MG tablet Take 1 tablet (10 mg total) by mouth 2 (two) times daily as needed for muscle spasms. 20 tablet 0   cyclobenzaprine (FLEXERIL) 5 MG tablet Take 1 tablet (5 mg total) by mouth 3 (three) times daily as needed for muscle spasms. 20 tablet 0   fluconazole (DIFLUCAN) 150 MG tablet Take 1 tablet (150 mg total) by mouth daily. Please call the clinic if your symptoms do not improve within 3-4 days. (Patient not taking: Reported on 01/24/2023) 1 tablet 0   Allergies  Allergen Reactions   Latex Itching    ROS:  Pertinent positives/negatives listed above.  I have reviewed patient's Past Medical Hx, Surgical Hx, Family Hx, Social Hx, medications and allergies.   Physical Exam  Patient Vitals  for the past 24 hrs:  BP Temp Temp src Pulse Resp SpO2  01/27/23 1607 127/71 98.2 F (36.8 C) Oral (!) 102 14 100 %   Constitutional: Well-developed, well-nourished female in no acute distress  Cardiovascular: normal rate Respiratory: normal effort GI: Abd soft, non-tender. Pos BS x 4 MS: Extremities nontender, no edema, normal ROM Neurologic: Alert and oriented x 4.  GU: Neg CVAT. PELVIC EXAM: Cervix pink, visually slightly open, without lesion, mildly friable os, copious white creamy discharge, vaginal walls and external genitalia normal. No bleeding, no pooling Cervix: 1/50/ballotable  FHT:  Baseline 130 , moderate variability, accelerations present, no decelerations Contractions: UI, rare ctx  LAB RESULTS Results for orders placed or performed during the hospital encounter of 01/27/23 (from the past 24 hour(s))  Fern Test     Status: Normal   Collection Time: 01/27/23  4:32 PM  Result Value Ref Range   POCT Fern Test Negative = intact amniotic membranes   Rupture of Membrane (ROM) Plus     Status: None   Collection Time: 01/27/23  5:23 PM  Result Value Ref Range   Rom Plus NEGATIVE   Wet prep, genital     Status: Abnormal   Collection Time: 01/27/23  5:23 PM  Result Value Ref Range   Yeast Wet Prep HPF POC NONE SEEN NONE SEEN   Trich, Wet Prep NONE SEEN NONE SEEN   Clue Cells Wet Prep HPF POC PRESENT (A) NONE SEEN   WBC, Wet Prep HPF POC >=10 (A) <10   Sperm NONE SEEN     --/--/O NEG, O NEG (07/24 2122)  IMAGING No results found.  MAU Management/MDM: Orders Placed This Encounter  Procedures   Wet prep, genital   Rupture of Membrane (ROM) Plus   Fern Test   Discharge patient    Meds ordered this encounter  Medications   metroNIDAZOLE (FLAGYL) tablet 500 mg   metroNIDAZOLE (FLAGYL) 500 MG tablet    Sig: Take 1 tablet (500 mg total) by mouth 2 (two) times daily for 13 doses.    Dispense:  13 tablet    Refill:  0    Patient presented with concern for PPROM.  Found to be intact with no pooling, negative fern, negative ROM plus. Did find BV. Will treat with flagyl x7 days. Return precautions for preterm labor given.  Reassuring fetal status throughout.  ASSESSMENT 1. BV (bacterial vaginosis)   2. Encounter for suspected premature rupture of amniotic membranes, with rupture of membranes not found   3. [redacted] weeks gestation of pregnancy     PLAN Discharge home with strict return precautions. Allergies as of 01/27/2023       Reactions   Latex Itching  Medication List     STOP taking these medications    fluconazole 150 MG tablet Commonly known as: Diflucan       TAKE these medications    acetaminophen 325 MG tablet Commonly known as: Tylenol Take 2 tablets (650 mg total) by mouth every 4 (four) hours as needed (for pain scale < 4).   aspirin EC 81 MG tablet Take 1 tablet (81 mg total) by mouth daily. Swallow whole.   Blood Pressure Monitor Devi Please check blood pressure once a week.   cyclobenzaprine 5 MG tablet Commonly known as: FLEXERIL Take 1 tablet (5 mg total) by mouth 3 (three) times daily as needed for muscle spasms.   cyclobenzaprine 10 MG tablet Commonly known as: FLEXERIL Take 1 tablet (10 mg total) by mouth 2 (two) times daily as needed for muscle spasms.   metroNIDAZOLE 500 MG tablet Commonly known as: FLAGYL Take 1 tablet (500 mg total) by mouth 2 (two) times daily for 13 doses.   ondansetron 4 MG disintegrating tablet Commonly known as: ZOFRAN-ODT Take 4 mg by mouth every 8 (eight) hours as needed for nausea or vomiting.   sertraline 50 MG tablet Commonly known as: Zoloft Take 2 tablets (100 mg total) by mouth daily.         Wylene Simmer, MD OB Fellow 01/27/2023  6:54 PM

## 2023-01-29 NOTE — Plan of Care (Signed)
[facility] Tonsillectomy/Adenoidectomy, Postoperative PEDS care plan entered in error.

## 2023-01-31 ENCOUNTER — Encounter (HOSPITAL_COMMUNITY): Payer: Self-pay

## 2023-01-31 ENCOUNTER — Telehealth (HOSPITAL_COMMUNITY): Payer: Self-pay | Admitting: *Deleted

## 2023-01-31 LAB — GC/CHLAMYDIA PROBE AMP (~~LOC~~) NOT AT ARMC
Chlamydia: NEGATIVE
Comment: NEGATIVE
Comment: NORMAL
Neisseria Gonorrhea: NEGATIVE

## 2023-01-31 NOTE — Telephone Encounter (Signed)
Preadmission screen  

## 2023-02-01 ENCOUNTER — Telehealth (HOSPITAL_COMMUNITY): Payer: Self-pay | Admitting: *Deleted

## 2023-02-01 ENCOUNTER — Encounter (HOSPITAL_COMMUNITY): Payer: Self-pay | Admitting: *Deleted

## 2023-02-01 ENCOUNTER — Ambulatory Visit (INDEPENDENT_AMBULATORY_CARE_PROVIDER_SITE_OTHER): Payer: Medicaid Other | Admitting: Advanced Practice Midwife

## 2023-02-01 ENCOUNTER — Other Ambulatory Visit (HOSPITAL_COMMUNITY)
Admission: RE | Admit: 2023-02-01 | Discharge: 2023-02-01 | Disposition: A | Discharge status: Home / Self Care | Payer: Medicaid Other | Source: Ambulatory Visit | Attending: Advanced Practice Midwife | Admitting: Advanced Practice Midwife

## 2023-02-01 VITALS — BP 120/78 | HR 71 | Wt 175.4 lb

## 2023-02-01 DIAGNOSIS — Z3480 Encounter for supervision of other normal pregnancy, unspecified trimester: Secondary | ICD-10-CM | POA: Diagnosis not present

## 2023-02-01 DIAGNOSIS — O133 Gestational [pregnancy-induced] hypertension without significant proteinuria, third trimester: Secondary | ICD-10-CM

## 2023-02-01 DIAGNOSIS — Z3A36 36 weeks gestation of pregnancy: Secondary | ICD-10-CM

## 2023-02-01 NOTE — Progress Notes (Signed)
   PRENATAL VISIT NOTE  Subjective:  Angela Calderon is a 22 y.o. G3P1011 at [redacted]w[redacted]d being seen today for ongoing prenatal care.  She is currently monitored for the following issues for this low-risk pregnancy and has Rh negative state in antepartum period; Supervision of other normal pregnancy, antepartum; Low lying placenta, antepartum; History of gestational hypertension; Pain of pelvic girdle; and Gestational hypertension, third trimester on their problem list.  Patient reports headache.  Contractions: Not present. Vag. Bleeding: None.  Movement: Present. Denies leaking of fluid.   The following portions of the patient's history were reviewed and updated as appropriate: allergies, current medications, past family history, past medical history, past social history, past surgical history and problem list.   Objective:   Vitals:   02/01/23 1048  BP: 120/78  Pulse: 71  Weight: 175 lb 6.4 oz (79.6 kg)    Fetal Status: Fetal Heart Rate (bpm): 138   Movement: Present     General:  Alert, oriented and cooperative. Patient is in no acute distress.  Skin: Skin is warm and dry. No rash noted.   Cardiovascular: Normal heart rate noted  Respiratory: Normal respiratory effort, no problems with respiration noted  Abdomen: Soft, gravid, appropriate for gestational age.  Pain/Pressure: Present     Pelvic: Cervical exam deferred        Extremities: Normal range of motion.  Edema: Trace  Mental Status: Normal mood and affect. Normal behavior. Normal judgment and thought content.   Assessment and Plan:  Pregnancy: G3P1011 at [redacted]w[redacted]d 1. Supervision of other normal pregnancy, antepartum --Anticipatory guidance about next visits/weeks of pregnancy given.   - Culture, beta strep (group b only) --GCC negative on 01/27/23  2. [redacted] weeks gestation of pregnancy   3. Gestational hypertension, third trimester --Persistent elevated BP at home and intermittent h/a --Continue with plan for IOL at 37  weeks --Normotensive in office today  Term labor symptoms and general obstetric precautions including but not limited to vaginal bleeding, contractions, leaking of fluid and fetal movement were reviewed in detail with the patient. Please refer to After Visit Summary for other counseling recommendations.   Return for As scheduled.  Future Appointments  Date Time Provider Department Center  02/04/2023  6:30 AM [facility] SCHED ROOM [facility] None    Sharen Counter, CNM

## 2023-02-01 NOTE — Progress Notes (Signed)
Pt reports fetal movement with some pressure. 

## 2023-02-01 NOTE — Telephone Encounter (Signed)
Preadmission screen  

## 2023-02-02 LAB — CERVICOVAGINAL ANCILLARY ONLY
Chlamydia: NEGATIVE
Comment: NEGATIVE
Comment: NEGATIVE
Comment: NORMAL
Neisseria Gonorrhea: NEGATIVE
Trichomonas: NEGATIVE

## 2023-02-04 ENCOUNTER — Inpatient Hospital Stay (HOSPITAL_COMMUNITY): Payer: Medicaid Other | Admitting: Anesthesiology

## 2023-02-04 ENCOUNTER — Inpatient Hospital Stay (HOSPITAL_COMMUNITY)
Admission: RE | Admit: 2023-02-04 | Payer: Medicaid Other | Source: Ambulatory Visit | Attending: Family Medicine | Admitting: Family Medicine

## 2023-02-04 ENCOUNTER — Inpatient Hospital Stay (HOSPITAL_COMMUNITY)
Admission: AD | Admit: 2023-02-04 | Discharge: 2023-02-06 | DRG: 807 | Disposition: A | Discharge status: Home / Self Care | Payer: Medicaid Other | Attending: Family Medicine | Admitting: Family Medicine

## 2023-02-04 ENCOUNTER — Other Ambulatory Visit: Payer: Self-pay

## 2023-02-04 ENCOUNTER — Encounter (HOSPITAL_COMMUNITY): Payer: Self-pay | Admitting: Family Medicine

## 2023-02-04 DIAGNOSIS — Z8049 Family history of malignant neoplasm of other genital organs: Secondary | ICD-10-CM | POA: Diagnosis not present

## 2023-02-04 DIAGNOSIS — O9902 Anemia complicating childbirth: Secondary | ICD-10-CM | POA: Diagnosis present

## 2023-02-04 DIAGNOSIS — Z8249 Family history of ischemic heart disease and other diseases of the circulatory system: Secondary | ICD-10-CM

## 2023-02-04 DIAGNOSIS — Z8041 Family history of malignant neoplasm of ovary: Secondary | ICD-10-CM

## 2023-02-04 DIAGNOSIS — O134 Gestational [pregnancy-induced] hypertension without significant proteinuria, complicating childbirth: Secondary | ICD-10-CM | POA: Diagnosis not present

## 2023-02-04 DIAGNOSIS — Z7982 Long term (current) use of aspirin: Secondary | ICD-10-CM | POA: Diagnosis not present

## 2023-02-04 DIAGNOSIS — O4443 Low lying placenta NOS or without hemorrhage, third trimester: Secondary | ICD-10-CM | POA: Diagnosis not present

## 2023-02-04 DIAGNOSIS — Z79899 Other long term (current) drug therapy: Secondary | ICD-10-CM

## 2023-02-04 DIAGNOSIS — O139 Gestational [pregnancy-induced] hypertension without significant proteinuria, unspecified trimester: Secondary | ICD-10-CM | POA: Diagnosis present

## 2023-02-04 DIAGNOSIS — Z91040 Latex allergy status: Secondary | ICD-10-CM | POA: Diagnosis not present

## 2023-02-04 DIAGNOSIS — O133 Gestational [pregnancy-induced] hypertension without significant proteinuria, third trimester: Principal | ICD-10-CM

## 2023-02-04 DIAGNOSIS — Z3A37 37 weeks gestation of pregnancy: Secondary | ICD-10-CM

## 2023-02-04 DIAGNOSIS — Z833 Family history of diabetes mellitus: Secondary | ICD-10-CM

## 2023-02-04 DIAGNOSIS — Z6791 Unspecified blood type, Rh negative: Secondary | ICD-10-CM | POA: Diagnosis not present

## 2023-02-04 HISTORY — DX: Unspecified asthma, uncomplicated: J45.909

## 2023-02-04 LAB — CBC
HCT: 31.3 % — ABNORMAL LOW (ref 36.0–46.0)
HCT: 30.5 % — ABNORMAL LOW (ref 36.0–46.0)
Hemoglobin: 10.4 g/dL — ABNORMAL LOW (ref 12.0–15.0)
Hemoglobin: 10 g/dL — ABNORMAL LOW (ref 12.0–15.0)
MCH: 28.7 pg (ref 26.0–34.0)
MCH: 28.1 pg (ref 26.0–34.0)
MCHC: 33.2 g/dL (ref 30.0–36.0)
MCHC: 32.8 g/dL (ref 30.0–36.0)
MCV: 86.2 fL (ref 80.0–100.0)
MCV: 85.7 fL (ref 80.0–100.0)
Platelets: 314 10*3/uL (ref 150–400)
Platelets: 312 10*3/uL (ref 150–400)
RBC: 3.63 MIL/uL — ABNORMAL LOW (ref 3.87–5.11)
RBC: 3.56 MIL/uL — ABNORMAL LOW (ref 3.87–5.11)
RDW: 13.7 % (ref 11.5–15.5)
RDW: 13.7 % (ref 11.5–15.5)
WBC: 11 10*3/uL — ABNORMAL HIGH (ref 4.0–10.5)
WBC: 13.9 10*3/uL — ABNORMAL HIGH (ref 4.0–10.5)
nRBC: 0 % (ref 0.0–0.2)
nRBC: 0 % (ref 0.0–0.2)

## 2023-02-04 LAB — CBC WITH DIFFERENTIAL/PLATELET
Abs Immature Granulocytes: 0.11 10*3/uL — ABNORMAL HIGH (ref 0.00–0.07)
Basophils Absolute: 0.1 10*3/uL (ref 0.0–0.1)
Basophils Relative: 0 %
Eosinophils Absolute: 0 10*3/uL (ref 0.0–0.5)
Eosinophils Relative: 0 %
HCT: 30.3 % — ABNORMAL LOW (ref 36.0–46.0)
Hemoglobin: 9.7 g/dL — ABNORMAL LOW (ref 12.0–15.0)
Immature Granulocytes: 1 %
Lymphocytes Relative: 11 %
Lymphs Abs: 1.8 10*3/uL (ref 0.7–4.0)
MCH: 27.4 pg (ref 26.0–34.0)
MCHC: 32 g/dL (ref 30.0–36.0)
MCV: 85.6 fL (ref 80.0–100.0)
Monocytes Absolute: 0.9 10*3/uL (ref 0.1–1.0)
Monocytes Relative: 6 %
Neutro Abs: 13.4 10*3/uL — ABNORMAL HIGH (ref 1.7–7.7)
Neutrophils Relative %: 82 %
Platelets: 265 10*3/uL (ref 150–400)
RBC: 3.54 MIL/uL — ABNORMAL LOW (ref 3.87–5.11)
RDW: 13.7 % (ref 11.5–15.5)
WBC: 16.4 10*3/uL — ABNORMAL HIGH (ref 4.0–10.5)
nRBC: 0 % (ref 0.0–0.2)

## 2023-02-04 LAB — GROUP B STREP BY PCR: Group B strep by PCR: NEGATIVE

## 2023-02-04 LAB — TYPE AND SCREEN
ABO/RH(D): O NEG
Antibody Screen: NEGATIVE

## 2023-02-04 LAB — PROTEIN / CREATININE RATIO, URINE
Creatinine, Urine: 171 mg/dL
Protein Creatinine Ratio: 0.11 mg/mg{creat} (ref 0.00–0.15)
Total Protein, Urine: 18 mg/dL

## 2023-02-04 LAB — COMPREHENSIVE METABOLIC PANEL
ALT: 15 U/L (ref 0–44)
AST: 23 U/L (ref 15–41)
Albumin: 2.5 g/dL — ABNORMAL LOW (ref 3.5–5.0)
Alkaline Phosphatase: 143 U/L — ABNORMAL HIGH (ref 38–126)
Anion gap: 10 (ref 5–15)
BUN: 6 mg/dL (ref 6–20)
CO2: 18 mmol/L — ABNORMAL LOW (ref 22–32)
Calcium: 8.5 mg/dL — ABNORMAL LOW (ref 8.9–10.3)
Chloride: 109 mmol/L (ref 98–111)
Creatinine, Ser: 0.7 mg/dL (ref 0.44–1.00)
GFR, Estimated: >60 mL/min (ref >60)
Glucose, Bld: 75 mg/dL (ref 70–99)
Potassium: 4 mmol/L (ref 3.5–5.1)
Sodium: 137 mmol/L (ref 135–145)
Total Bilirubin: 0.5 mg/dL (ref 0.3–1.2)
Total Protein: 5.9 g/dL — ABNORMAL LOW (ref 6.5–8.1)

## 2023-02-04 LAB — RPR: RPR Ser Ql: NONREACTIVE

## 2023-02-04 MED ORDER — WITCH HAZEL-GLYCERIN EX PADS: 1.0000 | MEDICATED_PAD | CUTANEOUS | Status: DC | PRN

## 2023-02-04 MED ORDER — LACTATED RINGERS IV SOLN: INTRAVENOUS | Status: DC

## 2023-02-04 MED ORDER — LACTATED RINGERS IV SOLN: 500.0000 mL | INTRAVENOUS | Status: DC | PRN

## 2023-02-04 MED ORDER — ONDANSETRON HCL 4 MG/2ML IJ SOLN: 4.0000 mg | INTRAMUSCULAR | Status: DC | PRN

## 2023-02-04 MED ORDER — COCONUT OIL OIL: 1.0000 | TOPICAL_OIL | Status: DC | PRN

## 2023-02-04 MED ORDER — OXYCODONE-ACETAMINOPHEN 5-325 MG PO TABS: 2.0000 | ORAL_TABLET | ORAL | Status: DC | PRN

## 2023-02-04 MED ORDER — IBUPROFEN 600 MG PO TABS
600.0000 mg | ORAL_TABLET | Freq: Four times a day (QID) | ORAL | Status: DC
Start: 2023-02-05 — End: 2023-02-06
  Administered 2023-02-04 – 2023-02-06 (×6): 600 mg via ORAL
  Filled 2023-02-04 (×6): qty 1

## 2023-02-04 MED ORDER — SENNOSIDES-DOCUSATE SODIUM 8.6-50 MG PO TABS
2.0000 | ORAL_TABLET | Freq: Every day | ORAL | Status: DC
  Administered 2023-02-05: 2 via ORAL
  Filled 2023-02-04: qty 2

## 2023-02-04 MED ORDER — ACETAMINOPHEN 325 MG PO TABS
650.0000 mg | ORAL_TABLET | ORAL | Status: DC | PRN
  Administered 2023-02-05: 650 mg via ORAL
  Filled 2023-02-04: qty 2

## 2023-02-04 MED ORDER — DIBUCAINE (PERIANAL) 1 % EX OINT: 1.0000 | TOPICAL_OINTMENT | CUTANEOUS | Status: DC | PRN

## 2023-02-04 MED ORDER — OXYTOCIN-SODIUM CHLORIDE 30-0.9 UT/500ML-% IV SOLN
2.5000 [IU]/h | INTRAVENOUS | Status: DC
  Administered 2023-02-04: 2.5 [IU]/h via INTRAVENOUS

## 2023-02-04 MED ORDER — TETANUS-DIPHTH-ACELL PERTUSSIS 5-2.5-18.5 LF-MCG/0.5 IM SUSY: 0.5000 mL | PREFILLED_SYRINGE | Freq: Once | INTRAMUSCULAR | Status: DC

## 2023-02-04 MED ORDER — TERBUTALINE SULFATE 1 MG/ML IJ SOLN: 0.2500 mg | Freq: Once | INTRAMUSCULAR | Status: DC | PRN

## 2023-02-04 MED ORDER — ACETAMINOPHEN 325 MG PO TABS: 650.0000 mg | ORAL_TABLET | ORAL | Status: DC | PRN

## 2023-02-04 MED ORDER — ONDANSETRON HCL 4 MG/2ML IJ SOLN: 4.0000 mg | Freq: Four times a day (QID) | INTRAMUSCULAR | Status: DC | PRN

## 2023-02-04 MED ORDER — LIDOCAINE-EPINEPHRINE (PF) 2 %-1:200000 IJ SOLN
INTRAMUSCULAR | Status: DC | PRN
  Administered 2023-02-04: 5 mL via EPIDURAL

## 2023-02-04 MED ORDER — OXYTOCIN BOLUS FROM INFUSION
333.0000 mL | Freq: Once | INTRAVENOUS | Status: AC
  Administered 2023-02-04: 333 mL via INTRAVENOUS

## 2023-02-04 MED ORDER — ONDANSETRON HCL 4 MG PO TABS: 4.0000 mg | ORAL_TABLET | ORAL | Status: DC | PRN

## 2023-02-04 MED ORDER — PHENYLEPHRINE 80 MCG/ML (10ML) SYRINGE FOR IV PUSH (FOR BLOOD PRESSURE SUPPORT): 80.0000 ug | PREFILLED_SYRINGE | INTRAVENOUS | Status: DC | PRN

## 2023-02-04 MED ORDER — DIPHENHYDRAMINE HCL 25 MG PO CAPS: 25.0000 mg | ORAL_CAPSULE | Freq: Four times a day (QID) | ORAL | Status: DC | PRN

## 2023-02-04 MED ORDER — LIDOCAINE HCL (PF) 1 % IJ SOLN: 30.0000 mL | INTRAMUSCULAR | Status: DC | PRN

## 2023-02-04 MED ORDER — SERTRALINE HCL 100 MG PO TABS
100.0000 mg | ORAL_TABLET | Freq: Every day | ORAL | Status: DC
  Administered 2023-02-04 – 2023-02-05 (×2): 100 mg via ORAL
  Filled 2023-02-04 (×2): qty 1

## 2023-02-04 MED ORDER — MISOPROSTOL 50MCG HALF TABLET
50.0000 ug | ORAL_TABLET | Freq: Once | ORAL | Status: AC
  Administered 2023-02-04: 50 ug via ORAL
  Filled 2023-02-04: qty 1

## 2023-02-04 MED ORDER — OXYTOCIN-SODIUM CHLORIDE 30-0.9 UT/500ML-% IV SOLN
1.0000 m[IU]/min | INTRAVENOUS | Status: DC
  Administered 2023-02-04 (×2): 2 m[IU]/min via INTRAVENOUS
  Filled 2023-02-04: qty 500

## 2023-02-04 MED ORDER — PRENATAL MULTIVITAMIN CH
1.0000 | ORAL_TABLET | Freq: Every day | ORAL | Status: DC
  Filled 2023-02-04: qty 1

## 2023-02-04 MED ORDER — MISOPROSTOL 25 MCG QUARTER TABLET
25.0000 ug | ORAL_TABLET | Freq: Once | ORAL | Status: AC
  Administered 2023-02-04: 25 ug via VAGINAL
  Filled 2023-02-04: qty 1

## 2023-02-04 MED ORDER — BENZOCAINE-MENTHOL 20-0.5 % EX AERO
1.0000 | INHALATION_SPRAY | CUTANEOUS | Status: DC | PRN
  Administered 2023-02-05: 1 via TOPICAL
  Filled 2023-02-04: qty 56

## 2023-02-04 MED ORDER — EPHEDRINE 5 MG/ML INJ: 10.0000 mg | INTRAVENOUS | Status: DC | PRN

## 2023-02-04 MED ORDER — OXYCODONE-ACETAMINOPHEN 5-325 MG PO TABS: 1.0000 | ORAL_TABLET | ORAL | Status: DC | PRN

## 2023-02-04 MED ORDER — FENTANYL CITRATE (PF) 100 MCG/2ML IJ SOLN
50.0000 ug | INTRAMUSCULAR | Status: DC | PRN
Start: 2023-02-04 — End: 2023-02-04
  Administered 2023-02-04: 50 ug via INTRAVENOUS
  Administered 2023-02-04: 100 ug via INTRAVENOUS
  Administered 2023-02-04: 50 ug via INTRAVENOUS
  Administered 2023-02-04: 100 ug via INTRAVENOUS
  Filled 2023-02-04 (×4): qty 2

## 2023-02-04 MED ORDER — FENTANYL-BUPIVACAINE-NACL 0.5-0.125-0.9 MG/250ML-% EP SOLN
12.0000 mL/h | EPIDURAL | Status: DC | PRN
Start: 2023-02-04 — End: 2023-02-04
  Administered 2023-02-04: 12 mL/h via EPIDURAL
  Filled 2023-02-04: qty 250

## 2023-02-04 MED ORDER — SIMETHICONE 80 MG PO CHEW: 80.0000 mg | CHEWABLE_TABLET | ORAL | Status: DC | PRN

## 2023-02-04 MED ORDER — DIPHENHYDRAMINE HCL 50 MG/ML IJ SOLN
12.5000 mg | INTRAMUSCULAR | Status: DC | PRN
  Administered 2023-02-04: 12.5 mg via INTRAVENOUS
  Filled 2023-02-04: qty 1

## 2023-02-04 MED ORDER — SOD CITRATE-CITRIC ACID 500-334 MG/5ML PO SOLN: 30.0000 mL | ORAL | Status: DC | PRN

## 2023-02-04 MED ORDER — ZOLPIDEM TARTRATE 5 MG PO TABS: 5.0000 mg | ORAL_TABLET | Freq: Every evening | ORAL | Status: DC | PRN

## 2023-02-04 NOTE — Progress Notes (Signed)
Angela Calderon is a 22 y.o. G3P1011 at [redacted]w[redacted]d by LMP admitted for induction of labor due to [facility].  Subjective: Pt comfortable, feeling mild cramping following Cytotec initial dose.  Husband at bedside for support.  Objective: BP 116/72   Pulse 66   Temp 98.6 F (37 C) (Oral)   Resp 16   Ht 5\' 3"  (1.6 m)   Wt 79.2 kg   LMP  (Approximate) Comment: December 2023  BMI 30.95 kg/m  No intake/output data recorded. No intake/output data recorded.  FHT:  FHR: 135 bpm, variability: moderate,  accelerations:  Present,  decelerations:  Absent UC:   regular, every 2-3 minutes SVE:   Dilation: 2.5 Effacement (%): 50 Station: -2 Exam by:: Leftwich-Kirby, CNM  Labs: Lab Results  Component Value Date   WBC 11.0 (H) 02/04/2023   HGB 10.4 (L) 02/04/2023   HCT 31.3 (L) 02/04/2023   MCV 86.2 02/04/2023   PLT 314 02/04/2023    Assessment / Plan: Induction of labor due to [facility],  progressing well on pitocin S/P Cytotec oral and vaginal initial dose   Labor: Cervix favorable for Pitocin and AROM, GBS unknown (office testing not resulted) and PCR pending.  Will start Pitocin now, AROM if GBS negative or after adequate Tx if positive.  Preeclampsia:  labs stable Fetal Wellbeing:  Category I Pain Control:  Labor support without medications I/D:   GBS unknown, pending Anticipated MOD:  NSVD  Sharen Counter, CNM 02/04/2023, 12:15 PM

## 2023-02-04 NOTE — Progress Notes (Signed)
Angela Calderon is a 22 y.o. G3P1011 at [redacted]w[redacted]d admitted for induction of labor due to [facility].  Subjective: Pt feeling more painful contractions, not well controlled with low dose of IV Fentanyl (50 mcg).    Objective: BP 113/76   Pulse 79   Temp 98.5 F (36.9 C) (Oral)   Resp 16   Ht 5\' 3"  (1.6 m)   Wt 79.2 kg   LMP  (Approximate) Comment: December 2023  BMI 30.95 kg/m  No intake/output data recorded. No intake/output data recorded.  FHT:  FHR: 135 bpm, variability: moderate,  accelerations:  Present,  decelerations:  Absent UC:   regular, every 2-3 minutes SVE:   Dilation: 4 Effacement (%): 60 Station: -2 Exam by:: Sharen Counter, CNM  Labs: Lab Results  Component Value Date   WBC 11.0 (H) 02/04/2023   HGB 10.4 (L) 02/04/2023   HCT 31.3 (L) 02/04/2023   MCV 86.2 02/04/2023   PLT 314 02/04/2023    Assessment / Plan: IOL progressing normally GBS negative result Pitocin initiated after previous exam 4 +hours following Cytotec dose  Labor:  Discussed options  and pt opted for AROM at this time. AROM with clear fluid.  Cervix now 4/60/-2, vertex position noted.  Pt desires to try full dose of 100 mcg Fentanyl x 1 dose then considering epidural. Preeclampsia:  labs stable Fetal Wellbeing:  Category I Pain Control:  IV pain meds I/D:   GBS neg Anticipated MOD:  NSVD  Sharen Counter, CNM 02/04/2023, 3:44 PM

## 2023-02-04 NOTE — H&P (Addendum)
OBSTETRIC ADMISSION HISTORY AND PHYSICAL  Angela Calderon is a 22 y.o. female G3P1011 with IUP at [redacted]w[redacted]d by LMP confirmed with first trimester [place] presenting for IOL indicated for gHTN. She reports +FMs, No LOF, no VB, no blurry vision, headaches or peripheral edema, and RUQ pain.  She is undecided about feeding. She requests BTL for birth control. She received her prenatal care at  [facility]    Dating: By LMP c/w 1st trimester [place]  --->  Estimated Date of Delivery: 02/25/23  Sono:   @[redacted]w[redacted]d , CWD, normal anatomy, cephalic presentation, 1739g, 95% EFW   Prenatal History/Complications:   Patient Active Problem List   Diagnosis Date Noted   Gestational hypertension 02/04/2023   Gestational hypertension, third trimester 01/24/2023   Pain of pelvic girdle 01/03/2023   Low lying placenta, antepartum 10/05/2022   History of gestational hypertension 10/05/2022   Supervision of other normal pregnancy, antepartum 08/09/2022   Rh negative state in antepartum period 12/13/2020     Past Medical History: Past Medical History:  Diagnosis Date   Asthma    Contraception management 09/05/2020   Gestational hypertension 07/01/2021   History of miscarriage 11/25/2020   Itching 05/15/2021   normal bile acids 05/15/21   Pregnancy induced hypertension    UTI (urinary tract infection)     Past Surgical History: Past Surgical History:  Procedure Laterality Date   TONSILLECTOMY AND ADENOIDECTOMY Bilateral 09/29/2012   Procedure: TONSILLECTOMY AND ADENOIDECTOMY;  Surgeon: Melvenia Beam, MD;  Location: [facility] OR;  Service: ENT;  Laterality: Bilateral;    Obstetrical History: OB History     Gravida  3   Para  1   Term  1   Preterm  0   AB  1   Living  1      SAB  1   IAB  0   Ectopic  0   Multiple  0   Live Births  1           Social History Social History   Socioeconomic History   Marital status: Single    Spouse name: Not on file   Number of children: Not on file   Years  of education: Not on file   Highest education level: Not on file  Occupational History   Not on file  Tobacco Use   Smoking status: Never    Passive exposure: Yes   Smokeless tobacco: Never  Vaping Use   Vaping status: Never Used  Substance and Sexual Activity   Alcohol use: Not Currently   Drug use: No   Sexual activity: Yes    Partners: Male    Comment: last ic 1 week ago  Other Topics Concern   Not on file  Social History Narrative   ** Merged History Encounter **       Social Determinants of Health   Financial Resource Strain: Not on file  Food Insecurity: No Food Insecurity (02/04/2023)   Hunger Vital Sign    Worried About Running Out of Food in the Last Year: Never true    Ran Out of Food in the Last Year: Never true  Transportation Needs: No Transportation Needs (02/04/2023)   PRAPARE - Administrator, Civil Service (Medical): No    Lack of Transportation (Non-Medical): No  Physical Activity: Not on file  Stress: Not on file  Social Connections: Unknown (08/25/2021)   [facility]   Social Network    Social Network: Not on file  Family History: Family History  Problem Relation Age of Onset   Cervical cancer Mother    Ovarian cancer Paternal Grandmother    Diabetes Paternal Grandfather    Congestive Heart Failure Paternal Grandfather     Allergies: Allergies  Allergen Reactions   Latex Itching    Medications Prior to Admission  Medication Sig Dispense Refill Last Dose   aspirin EC 81 MG tablet Take 1 tablet (81 mg total) by mouth daily. Swallow whole. 30 tablet 5 02/03/2023   sertraline (ZOLOFT) 50 MG tablet Take 2 tablets (100 mg total) by mouth daily. 60 tablet 11 02/03/2023   acetaminophen (TYLENOL) 325 MG tablet Take 2 tablets (650 mg total) by mouth every 4 (four) hours as needed (for pain scale < 4). 60 tablet 0    Blood Pressure Monitor DEVI Please check blood pressure once a week. 1 each 0     cyclobenzaprine (FLEXERIL) 10 MG tablet Take 1 tablet (10 mg total) by mouth 2 (two) times daily as needed for muscle spasms. 20 tablet 0    cyclobenzaprine (FLEXERIL) 5 MG tablet Take 1 tablet (5 mg total) by mouth 3 (three) times daily as needed for muscle spasms. 20 tablet 0    ondansetron (ZOFRAN-ODT) 4 MG disintegrating tablet Take 4 mg by mouth every 8 (eight) hours as needed for nausea or vomiting.        Review of Systems   All systems reviewed and negative except as stated in HPI  Blood pressure 122/80, pulse 75, temperature 98.6 F (37 C), temperature source Oral, resp. rate 16, height 5\' 3"  (1.6 m), weight 79.2 kg, not currently breastfeeding. General appearance: alert, cooperative, and no distress Lungs: clear to auscultation bilaterally Heart: regular rate and rhythm Abdomen: soft, non-tender; bowel sounds normal Extremities: Homans sign is negative, no sign of DVT Presentation: cephalic Fetal monitoringBaseline: 125 bpm, Variability: Good {> 6 bpm), Accelerations: Reactive, and Decelerations: Absent Tocometry: Irregular ctx  Dilation: 2 Effacement (%): Thick Station: -3 Exam by:: Purvis Kilts, RN   Prenatal labs: ABO, Rh: --/--/O NEG, O NEG (07/24 2122) Antibody: POS, POS (07/24 2122) Rubella: 2.97 (04/29 1446) RPR: Non Reactive (08/19 0834)  HBsAg: Negative (04/29 1446)  HIV: Non Reactive (08/19 0834)  GBS:   unknown -- obtaining PCR  Prenatal Transfer Tool  Maternal Diabetes: No Genetic Screening: Normal Maternal Ultrasounds/Referrals: Normal Fetal Ultrasounds or other Referrals:  None Maternal Substance Abuse:  No Significant Maternal Medications:  Meds include: Zoloft Significant Maternal Lab Results:  Rh negative Number of Prenatal Visits:greater than 3 verified prenatal visits   No results found for this or any previous visit (from the past 24 hour(s)).  Patient Active Problem List   Diagnosis Date Noted   Gestational hypertension 02/04/2023    Gestational hypertension, third trimester 01/24/2023   Pain of pelvic girdle 01/03/2023   Low lying placenta, antepartum 10/05/2022   History of gestational hypertension 10/05/2022   Supervision of other normal pregnancy, antepartum 08/09/2022   Rh negative state in antepartum period 12/13/2020    Assessment/Plan:  Angela Calderon is a 22 y.o. G3P1011 at [redacted]w[redacted]d here for IOL indicated for gHTN  #Labor: IOL with dual cytotec. Plan to offer AROM/pit as indicated and able. Expect cervical change to NSVD.  #Pain: Epidural #FWB: Cat I tracing  #ID:  GBS pending, PCR obtained  awaiting results  #MOF: Formula #MOC: Undecided   This patient's plan of care has been discussed with Dr. Lucianne Muss. Please see attestation.    Dorma Russell  Candelaria Stagers, MD  02/04/2023, 7:28 AM  Attestation of Supervision of Resident:  I confirm that I have verified the information documented in the resident's note and that I have also personally reperformed the history, physical exam and all medical decision making activities.  I have verified that all services and findings are accurately documented in this resident's note; and I agree with management and plan as outlined in the documentation. I have also made any necessary editorial changes.  Sundra Aland, MD OB Fellow, Faculty Practice [facility], Center for [facility]

## 2023-02-04 NOTE — Discharge Summary (Signed)
Postpartum Discharge Summary  Date of Service updated***     Patient Name: Angela Calderon DOB: [birth year] MRN: [id number]  Date of admission: 02/04/2023 Delivery date:02/04/2023 Delivering provider: Sharen Counter A Date of discharge: 02/04/2023  Admitting diagnosis: Gestational hypertension [O13.9] Intrauterine pregnancy: [redacted]w[redacted]d     Secondary diagnosis:  Active Problems:   Gestational hypertension   Vaginal delivery  Additional problems: *** None   Discharge diagnosis: Term Pregnancy Delivered                                              Post partum procedures:rhogam Augmentation: AROM, Pitocin, and Cytotec Complications: None  Hospital course: Induction of Labor With Vaginal Delivery   22 y.o. yo G3P1011 at [redacted]w[redacted]d was admitted to the hospital 02/04/2023 for induction of labor.  Indication for induction:  GHTN .  Patient had an uncomplicated labor course. Membrane Rupture Time/Date: 3:30 PM,02/04/2023  Delivery Method:Vaginal, Spontaneous Operative Delivery:N/A Episiotomy: None Lacerations:  None Details of delivery can be found in separate delivery note.  Patient had a postpartum course complicated by***. Patient is discharged home 02/04/23.  Newborn Data: Birth date:02/04/2023 Birth time:8:05 PM Gender:Female Living status:Living Apgars:9 ,9  Weight:   Magnesium Sulfate received: No BMZ received: No Rhophylac:Yes MMR:No T-DaP: declines Flu: No RSV Vaccine received: No Transfusion:No  Immunizations received: There is no immunization history for the selected administration types on file for this patient.  Physical exam  Vitals:   02/04/23 1930 02/04/23 2015 02/04/23 2030 02/04/23 2049  BP: 113/71 (!) 124/58 127/72 (!) 113/59  Pulse: 72 73 88 71  Resp:  15    Temp:      TempSrc:      SpO2:      Weight:      Height:       General: {Exam; general:21111117} Lochia: {Desc; appropriate/inappropriate:30686::"appropriate"} Uterine Fundus:  {Desc; firm/soft:30687} Incision: {Exam; incision:21111123} DVT Evaluation: {Exam; dvt:2111122} Labs: Lab Results  Component Value Date   WBC 13.9 (H) 02/04/2023   HGB 10.0 (L) 02/04/2023   HCT 30.5 (L) 02/04/2023   MCV 85.7 02/04/2023   PLT 312 02/04/2023      Latest Ref Rng & Units 02/04/2023    5:46 AM  CMP  Glucose 70 - 99 mg/dL 75   BUN 6 - 20 mg/dL 6   Creatinine 9.56 - 2.13 mg/dL 0.86   Sodium 578 - 469 mmol/L 137   Potassium 3.5 - 5.1 mmol/L 4.0   Chloride 98 - 111 mmol/L 109   CO2 22 - 32 mmol/L 18   Calcium 8.9 - 10.3 mg/dL 8.5   Total Protein 6.5 - 8.1 g/dL 5.9   Total Bilirubin 0.3 - 1.2 mg/dL 0.5   Alkaline Phos 38 - 126 U/L 143   AST 15 - 41 U/L 23   ALT 0 - 44 U/L 15    Edinburgh Score:    08/31/2021    9:32 AM  Edinburgh Postnatal Depression Scale Screening Tool  I have been able to laugh and see the funny side of things. 0  I have looked forward with enjoyment to things. 0  I have blamed myself unnecessarily when things went wrong. 0  I have been anxious or worried for no good reason. 0  I have felt scared or panicky for no good reason. 0  Things have been getting on top of me.  0  I have been so unhappy that I have had difficulty sleeping. 0  I have felt sad or miserable. 0  I have been so unhappy that I have been crying. 0  The thought of harming myself has occurred to me. 0  Edinburgh Postnatal Depression Scale Total 0   No data recorded  After visit meds:  Allergies as of 02/04/2023       Reactions   Latex Itching     Med Rec must be completed prior to using this [facility]***        Discharge home in stable condition Infant Feeding: {Baby feeding:23562} Infant Disposition:{CHL IP OB HOME WITH RUEAVW:09811} Discharge instruction: per After Visit Summary and Postpartum booklet. Activity: Advance as tolerated. Pelvic rest for 6 weeks.  Diet: {OB BJYN:[id number]} Future Appointments:No future appointments. Follow up Visit:  Message  sent to [facility] on 02/04/23:  Please schedule this patient for a In person postpartum visit in  4 weeks  with the following provider:  Sharen Counter, CNM . Additional Postpartum F/U:Postpartum Depression checkup in 2 weeks, virtual, with Misty Stanley Low risk pregnancy complicated by: HTN Delivery mode:  Vaginal, Spontaneous Anticipated Birth Control:   vasectomy   02/04/2023 Sharen Counter, CNM

## 2023-02-04 NOTE — Anesthesia Preprocedure Evaluation (Signed)
Anesthesia Evaluation  Patient identified by MRN, date of birth, ID band Patient awake    Reviewed: Allergy & Precautions, NPO status , Patient's Chart, lab work & pertinent test results  Airway Mallampati: II  TM Distance: >3 FB Neck ROM: Full    Dental no notable dental hx.    Pulmonary asthma    Pulmonary exam normal breath sounds clear to auscultation       Cardiovascular hypertension (gHTN), Normal cardiovascular exam Rhythm:Regular Rate:Normal     Neuro/Psych negative neurological ROS  negative psych ROS   GI/Hepatic negative GI ROS, Neg liver ROS,,,  Endo/Other  negative endocrine ROS    Renal/GU negative Renal ROS  negative genitourinary   Musculoskeletal negative musculoskeletal ROS (+)    Abdominal   Peds  Hematology  (+) Blood dyscrasia, anemia   Anesthesia Other Findings IOL for gHTN  Reproductive/Obstetrics (+) Pregnancy                             Anesthesia Physical Anesthesia Plan  ASA: 3  Anesthesia Plan: Epidural   Post-op Pain Management:    Induction:   PONV Risk Score and Plan: Treatment may vary due to age or medical condition  Airway Management Planned: Natural Airway  Additional Equipment:   Intra-op Plan:   Post-operative Plan:   Informed Consent: I have reviewed the patients History and Physical, chart, labs and discussed the procedure including the risks, benefits and alternatives for the proposed anesthesia with the patient or authorized representative who has indicated his/her understanding and acceptance.       Plan Discussed with: Anesthesiologist  Anesthesia Plan Comments: (Patient identified. Risks, benefits, options discussed with patient including but not limited to bleeding, infection, nerve damage, paralysis, failed block, incomplete pain control, headache, blood pressure changes, nausea, vomiting, reactions to medication, itching,  and post partum back pain. Confirmed with bedside nurse the patient's most recent platelet count. Confirmed with the patient that they are not taking any anticoagulation, have any bleeding history or any family history of bleeding disorders. Patient expressed understanding and wishes to proceed. All questions were answered. )       Anesthesia Quick Evaluation

## 2023-02-04 NOTE — Anesthesia Procedure Notes (Signed)
Epidural Patient location during procedure: OB Start time: 02/04/2023 5:05 PM End time: 02/04/2023 5:15 PM  Staffing Anesthesiologist: Elmer Picker, MD Performed: anesthesiologist   Preanesthetic Checklist Completed: patient identified, IV checked, risks and benefits discussed, monitors and equipment checked, pre-op evaluation and timeout performed  Epidural Patient position: sitting Prep: DuraPrep and site prepped and draped Patient monitoring: continuous pulse ox, blood pressure, heart rate and cardiac monitor Approach: midline Location: L3-L4 Injection technique: LOR air  Needle:  Needle type: Tuohy  Needle gauge: 17 G Needle length: 9 cm Needle insertion depth: 6 cm Catheter type: closed end flexible Catheter size: 19 Gauge Catheter at skin depth: 11 cm Test dose: negative  Assessment Sensory level: T8 Events: blood not aspirated, no cerebrospinal fluid, injection not painful, no injection resistance, no paresthesia and negative IV test  Additional Notes Patient identified. Risks/Benefits/Options discussed with patient including but not limited to bleeding, infection, nerve damage, paralysis, failed block, incomplete pain control, headache, blood pressure changes, nausea, vomiting, reactions to medication both or allergic, itching and postpartum back pain. Confirmed with bedside nurse the patient's most recent platelet count. Confirmed with patient that they are not currently taking any anticoagulation, have any bleeding history or any family history of bleeding disorders. Patient expressed understanding and wished to proceed. All questions were answered. Sterile technique was used throughout the entire procedure. Please see nursing notes for vital signs. Test dose was given through epidural catheter and negative prior to continuing to dose epidural or start infusion. Warning signs of high block given to the patient including shortness of breath, tingling/numbness in  hands, complete motor block, or any concerning symptoms with instructions to call for help. Patient was given instructions on fall risk and not to get out of bed. All questions and concerns addressed with instructions to call with any issues or inadequate analgesia.  Reason for block:procedure for pain

## 2023-02-05 ENCOUNTER — Inpatient Hospital Stay (HOSPITAL_COMMUNITY): Payer: Medicaid Other

## 2023-02-05 LAB — CULTURE, BETA STREP (GROUP B ONLY): Strep Gp B Culture: NEGATIVE

## 2023-02-05 MED ORDER — FUROSEMIDE 20 MG PO TABS
20.0000 mg | ORAL_TABLET | Freq: Every day | ORAL | Status: DC
  Administered 2023-02-05: 20 mg via ORAL
  Filled 2023-02-05: qty 1

## 2023-02-05 NOTE — Anesthesia Postprocedure Evaluation (Signed)
Anesthesia Post Note  Patient: Angela Calderon  Procedure(s) Performed: AN AD HOC LABOR EPIDURAL     Anesthesia Type: Epidural Anesthetic complications: no   No notable events documented.  Last Vitals:  Vitals:   02/05/23 0545 02/05/23 0945  BP: 122/79 119/68  Pulse: 75 81  Resp:  18  Temp: 37.1 C 36.9 C  SpO2: 97% 98%    Last Pain:  Vitals:   02/05/23 0945  TempSrc: Oral  PainSc: 4    Pain Goal:                   Sonda Primes

## 2023-02-05 NOTE — Progress Notes (Signed)
Post Partum Day 1 Subjective: no complaints, up ad lib, voiding, tolerating PO, and + flatus  Objective: Blood pressure 119/68, pulse 81, temperature 98.4 F (36.9 C), temperature source Oral, resp. rate 18, height 5\' 3"  (1.6 m), weight 79.2 kg, SpO2 98%, unknown if currently breastfeeding.  Physical Exam:  General: alert, cooperative, and no distress Lochia: appropriate Uterine Fundus: firm Incision:  DVT Evaluation: No evidence of DVT seen on physical exam.  Recent Labs    02/04/23 1607 02/04/23 2124  HGB 10.0* 9.7*  HCT 30.5* 30.3*    Assessment/Plan: Plan for discharge tomorrow, Breastfeeding, and Contraception vasectomy Add lasix for fluid management pp   LOS: 1 day   Lazaro Arms, MD 02/05/2023, 10:53 AM

## 2023-02-06 IMAGING — US US MFM OB FOLLOW-UP
1 series · 13 of 28 positions shown · non-contrast
Comparison: none

[Series 1: us mfm ob follow-up · 13 of 30 slices shown]
[im 2/30]
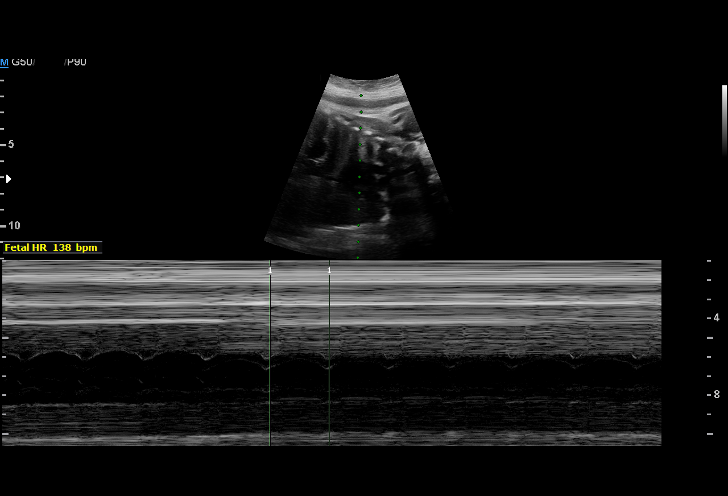
[im 4/30]
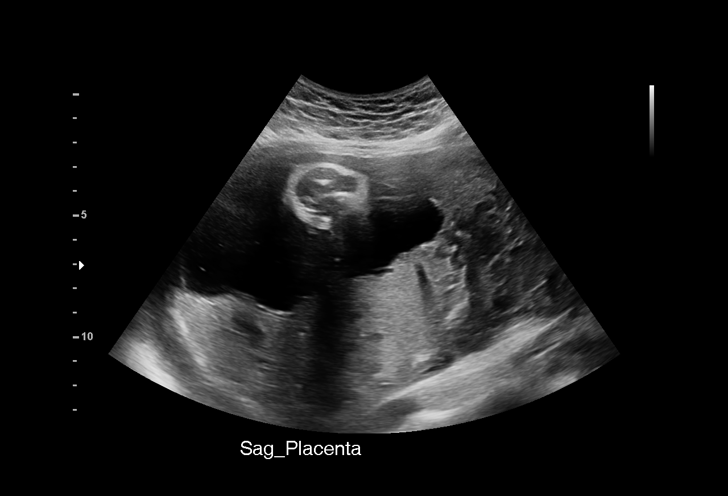
[im 6/30]
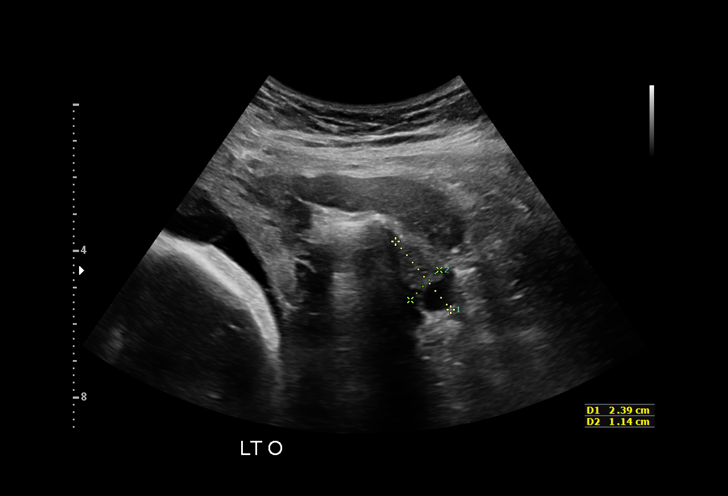
[im 8/30]
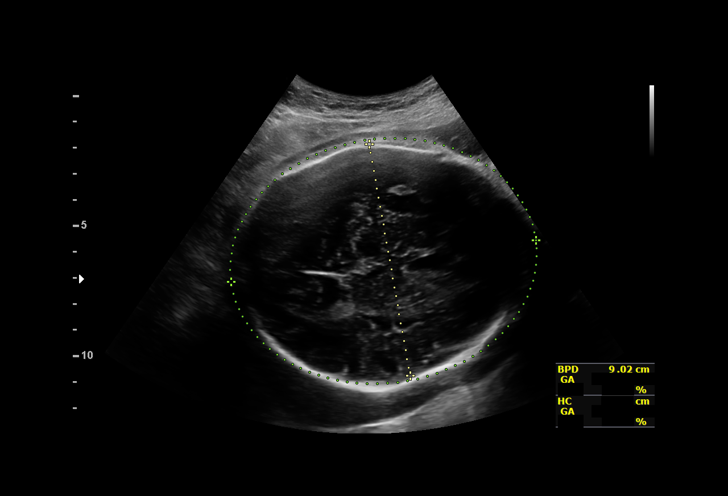
[im 10/30]
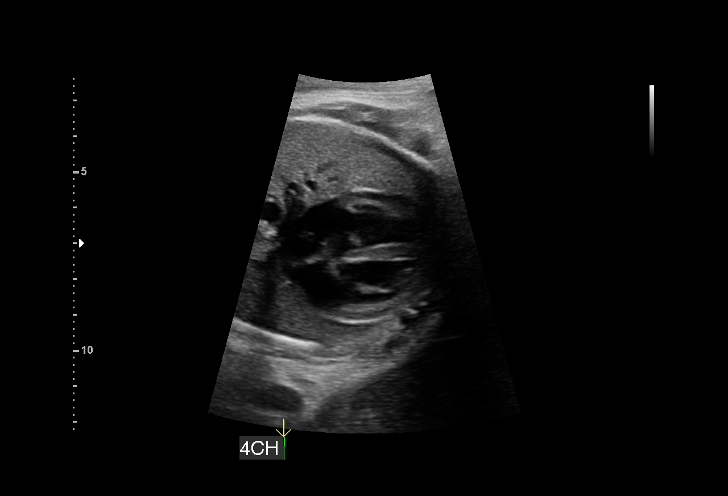
[im 12/30]
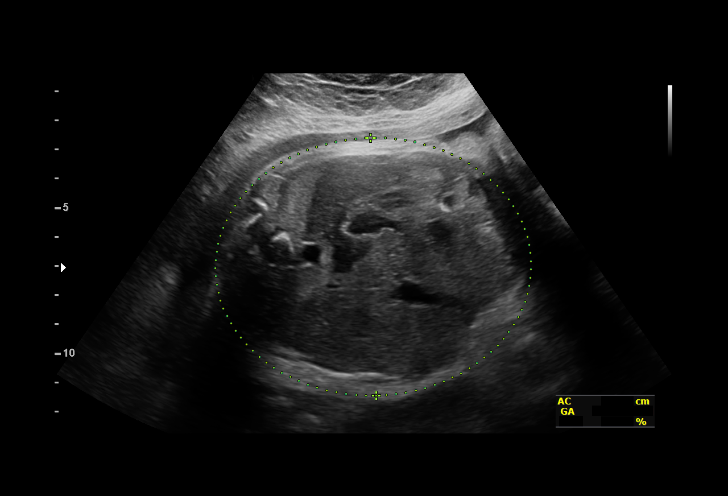
[im 16/30]
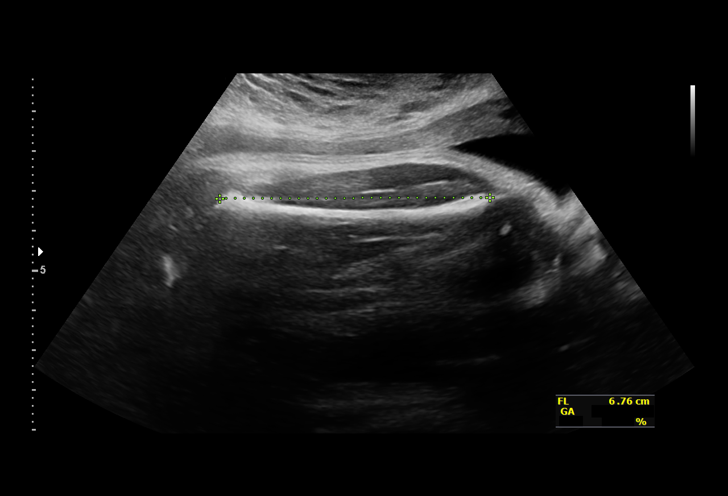
[im 18/30]
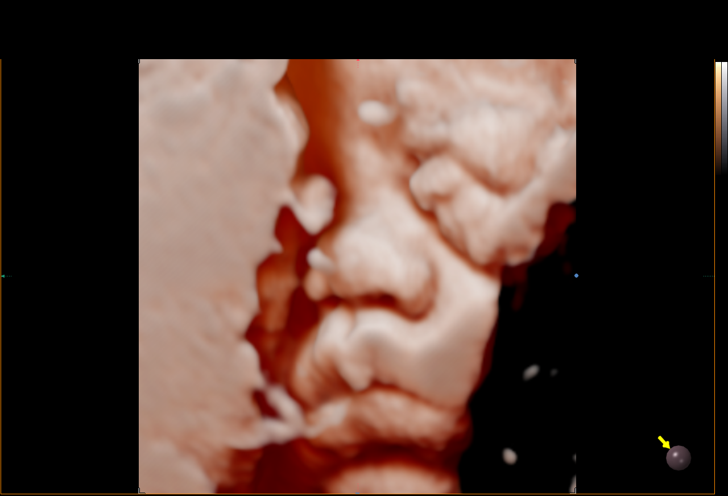
[im 20/30]
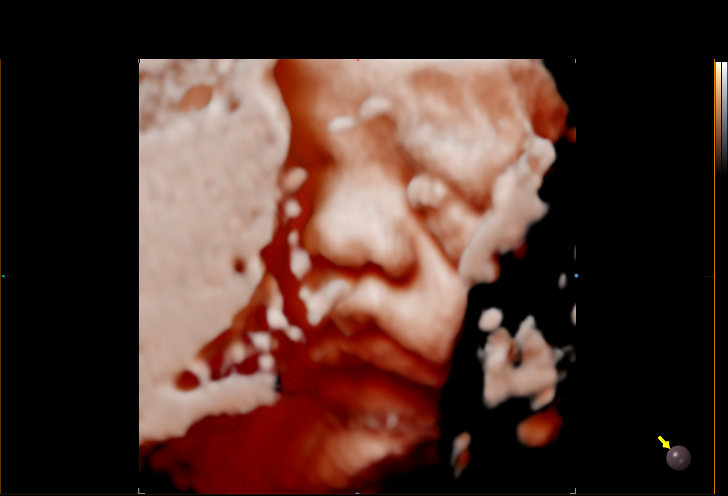
[im 22/30]
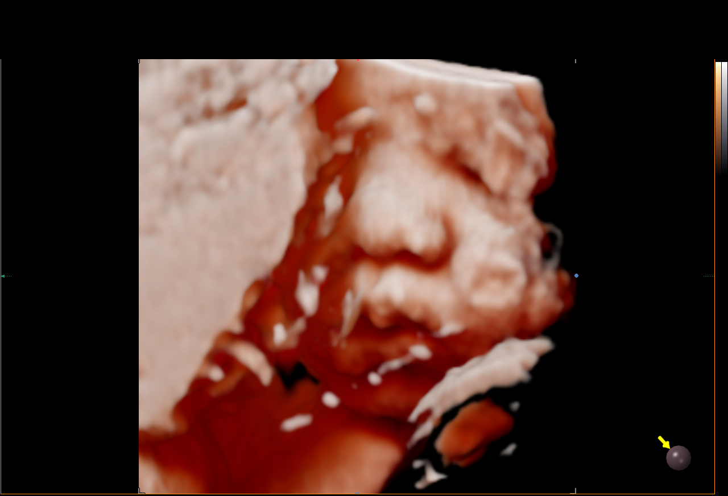
[im 24/30]
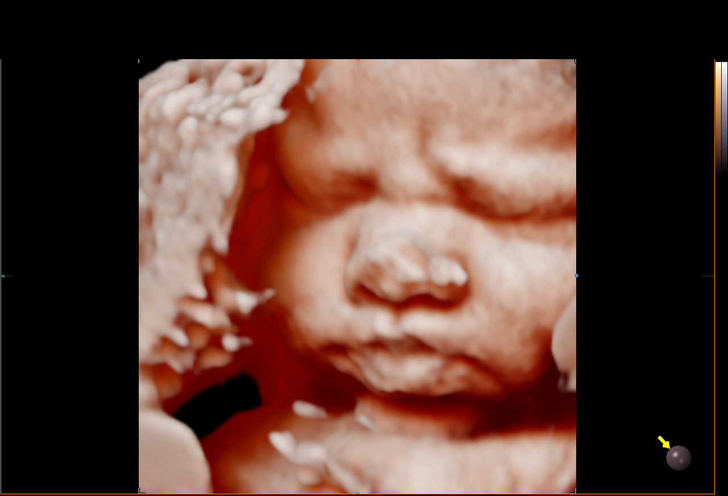
[im 26/30]
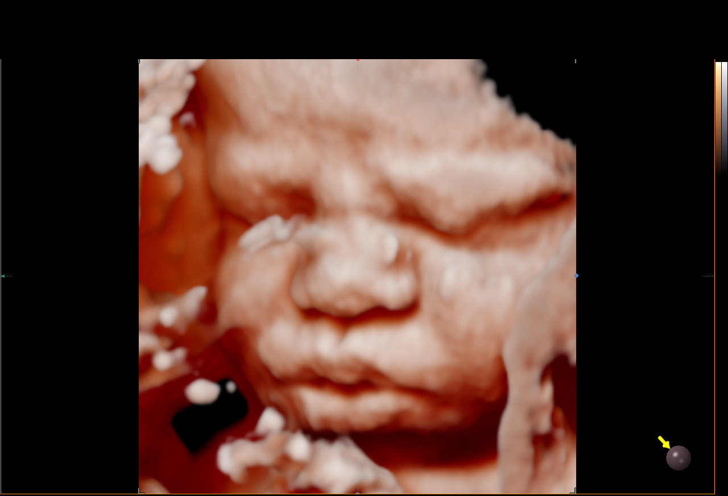
[im 28/30]
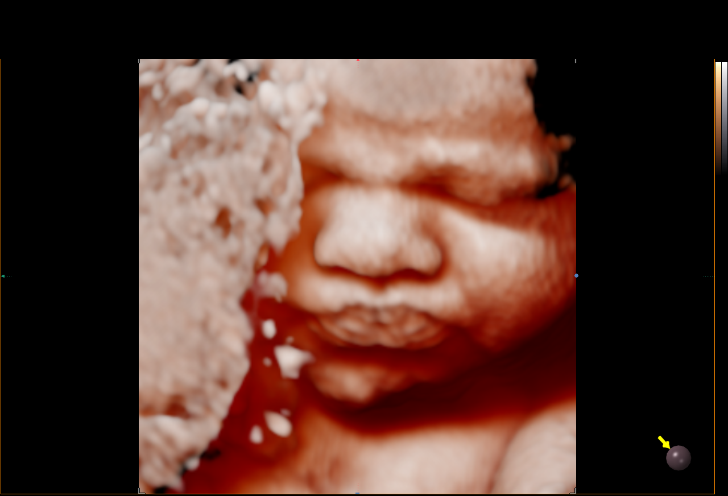

[13 of 28 positions shown; findings below may reference images not displayed]

Name:       PAULUS N CEEJAY              Visit Date: 07/07/2021 [REDACTED]

Indications

 Gestational hypertension without significant
 proteinuria, third trimester
 Size-Date Discrepancy
 Medical complication of pregnancy (COVID
 + [DATE])
 LR NIPS - Male, Neg Horizon, Neg AFP
 35 weeks gestation of pregnancy
Fetal Evaluation

 Num Of Fetuses:         1
 Fetal Heart Rate(bpm):  138
 Cardiac Activity:       Observed
 Presentation:           Cephalic
 Placenta:               Posterior
 P. Cord Insertion:      Previously Visualized

 Amniotic Fluid
 AFI FV:      Within normal limits

 AFI Sum(cm)     %Tile       Largest Pocket(cm)
 14.98           55

 RUQ(cm)       RLQ(cm)       LUQ(cm)        LLQ(cm)

Biometry
 BPD:      90.8  mm     G. Age:  36w 6d         82  %    CI:        75.01   %    70 - 86
                                                         FL/HC:      20.4   %    20.1 -
 HC:      332.6  mm     G. Age:  38w 0d         71  %    HC/AC:      1.08        0.93 -
 AC:      307.3  mm     G. Age:  34w 5d         26  %    FL/BPD:     74.7   %    71 - 87
 FL:       67.8  mm     G. Age:  34w 6d         20  %    FL/AC:      22.1   %    20 - 24
 LV:          4  mm

 Est. FW:    1007  gm    5 lb 13 oz      34  %
OB History

 Gravidity:    2          SAB:   1
 Living:       0
Gestational Age

 LMP:           35w 6d        Date:  03/03/21                 EDD:   10/29/20
 U/S Today:     36w 1d                                        EDD:   08/05/21
 Best:          35w 6d     Det. By:  LMP  (08/03/21)          EDD:   10/29/20
Anatomy

 Cranium:               Appears normal         LVOT:                   Previously seen
 Cavum:                 Previously seen        Aortic Arch:            Previously seen
 Ventricles:            Appears normal         Ductal Arch:            Previously seen
 Choroid Plexus:        Previously seen        Diaphragm:              Previously seen
 Cerebellum:            Previously seen        Stomach:                Appears normal, left
                                                                       sided
 Posterior Fossa:       Previously seen        Abdomen:                Previously seen
 Nuchal Fold:           Not applicable (>20    Abdominal Wall:         Previously seen
                        wks GA)
 Face:                  Orbits and profile     Cord Vessels:           Previously seen
                        previously seen
 Lips:                  Previously seen        Kidneys:                Appear normal
 Palate:                Previously seen        Bladder:                Appears normal
 Thoracic:              Previously seen        Spine:                  Previously seen
 Heart:                 Appears normal         Upper Extremities:      Previously seen
                        (4CH, axis, and
                        situs)
 RVOT:                  Previously seen        Lower Extremities:      Previously seen

 Other:  Male gender previously seen. VC, 3VV, 3VTV, Nasal bone, lenses,
         Heels/feet and open hands/5th digits previously visualized.
         Technically difficult due to advanced GA and fetal position.
Cervix Uterus Adnexa

 Cervix
 Not visualized (advanced GA >93wks)

 Right Ovary
 Visualized.

 Left Ovary
 Visualized.
Comments

 This patient was seen for a follow up growth scan due to
 gestational hypertension and as her fundal heights have been
 measuring less than her dates.
 She was informed that the fetal growth and amniotic fluid
 level appears appropriate for her gestational age.
 Due to gestational hypertension, she already has a delivery
 scheduled on 08/05/21 (at 37 weeks).

## 2023-02-06 MED ORDER — SENNOSIDES-DOCUSATE SODIUM 8.6-50 MG PO TABS: 2.0000 | ORAL_TABLET | Freq: Every day | ORAL | 0 refills | Status: DC

## 2023-02-06 MED ORDER — ACETAMINOPHEN 325 MG PO TABS: 650.0000 mg | ORAL_TABLET | ORAL | 0 refills | Status: AC | PRN

## 2023-02-06 MED ORDER — IBUPROFEN 600 MG PO TABS: 600.0000 mg | ORAL_TABLET | Freq: Four times a day (QID) | ORAL | 0 refills | Status: AC

## 2023-02-06 MED ORDER — FUROSEMIDE 20 MG PO TABS: 20.0000 mg | ORAL_TABLET | Freq: Every day | ORAL | 0 refills | Status: DC

## 2023-02-06 NOTE — Discharge Instructions (Signed)
WHAT TO LOOK OUT FOR: Fever of 100.4 or above Mastitis: feels like flu and breasts hurt Infection: increased pain, swelling or redness Blood clots golf ball size or larger Postpartum depression   Congratulations on your newest addition!

## 2023-02-07 ENCOUNTER — Other Ambulatory Visit: Payer: Self-pay | Admitting: Advanced Practice Midwife

## 2023-02-07 DIAGNOSIS — O9089 Other complications of the puerperium, not elsewhere classified: Secondary | ICD-10-CM

## 2023-02-07 MED ORDER — OXYCODONE HCL 5 MG PO TABS: 5.0000 mg | ORAL_TABLET | Freq: Four times a day (QID) | ORAL | 0 refills | Status: DC | PRN

## 2023-02-07 NOTE — Progress Notes (Unsigned)
Pt contacted me via MyChart with postpartum cramping not completely resolved with ibuprofen and Tylenol.  Lochia small, and no other concerns.  Rx for Oxycodone 5 mg Q 6 hours PRN x 10 tabs sent to pt pharmacy. F/U if pain not resolved in a few days or any increase in bleeding, n/v, or fever/chills.  Pt has close follow up.  Mood check virtual visit in 2 weeks with me in the office then PP visit in 6 weeks.

## 2023-02-21 ENCOUNTER — Telehealth: Payer: Medicaid Other | Admitting: Advanced Practice Midwife

## 2023-02-21 VITALS — Ht 63.0 in | Wt 155.0 lb

## 2023-02-21 DIAGNOSIS — Z9189 Other specified personal risk factors, not elsewhere classified: Secondary | ICD-10-CM

## 2023-02-21 NOTE — Progress Notes (Signed)
    POSTPARTUM VIRTUAL VISIT ENCOUNTER NOTE  Provider location: Center for [facility]   Patient location: Home  I connected with Angela Calderon on 02/21/23 at 11:15 AM EST by MyChart Video Encounter and verified that I am speaking with the correct person using two identifiers.   I discussed the limitations, risks, security and privacy concerns of performing an evaluation and management service virtually and the availability of in person appointments. I also discussed with the patient that there may be a patient responsible charge related to this service. The patient expressed understanding and agreed to proceed.   History:  Angela Calderon is a 22 y.o. G67P2012 female  with vaginal delivery on 02/04/23 with visit today for postpartum mood check with hx peripartum depression/anxiety. She reports light lochia, with some change in color to orange, but no pain and no odor, irritation, or itching.      Past Medical History:  Diagnosis Date   Asthma    Contraception management 09/05/2020   Gestational hypertension 07/01/2021   History of miscarriage 11/25/2020   Itching 05/15/2021   normal bile acids 05/15/21   Pregnancy induced hypertension    UTI (urinary tract infection)    Past Surgical History:  Procedure Laterality Date   TONSILLECTOMY AND ADENOIDECTOMY Bilateral 09/29/2012   Procedure: TONSILLECTOMY AND ADENOIDECTOMY;  Surgeon: Melvenia Beam, MD;  Location: [facility] OR;  Service: ENT;  Laterality: Bilateral;   The following portions of the patient's history were reviewed and updated as appropriate: allergies, current medications, past family history, past medical history, past social history, past surgical history and problem list.   Health Maintenance:  Normal pap and negative HRHPV on 11/10/20.    Review of Systems:  Pertinent items noted in HPI and remainder of comprehensive ROS otherwise negative.  Physical Exam:   General:  Alert, oriented and cooperative.  Patient appears to be in no acute distress.  Mental Status: Normal mood and affect. Normal behavior. Normal judgment and thought content.   Respiratory: Normal respiratory effort, no problems with respiration noted  Rest of physical exam deferred due to type of encounter  Labs and Imaging No results found for this or any previous visit (from the past 336 hour(s)). No results found.     Assessment and Plan:     1. At risk for postpartum depression --Pt with anxiety/depression, on Zoloft prior to and during pregnancy --Reports she is doing well now, good support at home --Reviewed warning signs/reasons to seek care for mood changes --Reviewed pt lochia and expected color changes, reviewed reasons to seek care  --Pt to keep postpartum in person appt on 03/07/23.      I discussed the assessment and treatment plan with the patient. The patient was provided an opportunity to ask questions and all were answered. The patient agreed with the plan and demonstrated an understanding of the instructions.   The patient was advised to call back or seek an in-person evaluation/go to the ED if the symptoms worsen or if the condition fails to improve as anticipated.  I provided 10 minutes of face-to-face time during this encounter.  There was some difficulty with the video and sound so the second half of the appt was conducted over the phone but the visit initiated as a video visit.   Sharen Counter, CNM Center for [employer], [facility]

## 2023-03-07 ENCOUNTER — Other Ambulatory Visit: Payer: Self-pay | Admitting: Advanced Practice Midwife

## 2023-03-07 ENCOUNTER — Ambulatory Visit: Payer: Medicaid Other | Admitting: Advanced Practice Midwife

## 2023-03-07 ENCOUNTER — Encounter: Payer: Self-pay | Admitting: Advanced Practice Midwife

## 2023-03-07 DIAGNOSIS — F418 Other specified anxiety disorders: Secondary | ICD-10-CM | POA: Diagnosis not present

## 2023-03-07 DIAGNOSIS — O99345 Other mental disorders complicating the puerperium: Secondary | ICD-10-CM

## 2023-03-07 MED ORDER — SERTRALINE HCL 50 MG PO TABS: 150.0000 mg | ORAL_TABLET | Freq: Every day | ORAL | 11 refills | Status: DC

## 2023-03-07 NOTE — Progress Notes (Signed)
Post Partum Visit Note  Angela Calderon is a 22 y.o. G41P2012 female who presents for a postpartum visit. She is 4 weeks postpartum following a normal spontaneous vaginal delivery.  I have fully reviewed the prenatal and intrapartum course. The delivery was at 37 gestational weeks.  Anesthesia: epidural. Postpartum course has been good. Baby is doing well. Baby is feeding by bottle - Nutramigen . Bleeding staining only. Bowel function is normal. Bladder function is normal. Patient is sexually active. Contraception method is vasectomy. Postpartum depression screening: negative.   The pregnancy intention screening data noted above was reviewed. Potential methods of contraception were discussed. The patient elected to proceed with No data recorded.   Edinburgh Postnatal Depression Scale - 03/07/23 0948       Edinburgh Postnatal Depression Scale:  In the Past 7 Days   I have been able to laugh and see the funny side of things. 0    I have looked forward with enjoyment to things. 0    I have blamed myself unnecessarily when things went wrong. 0    I have been anxious or worried for no good reason. 0    I have felt scared or panicky for no good reason. 0    Things have been getting on top of me. 0    I have been so unhappy that I have had difficulty sleeping. 0    I have felt sad or miserable. 0    I have been so unhappy that I have been crying. 0    The thought of harming myself has occurred to me. 0    Edinburgh Postnatal Depression Scale Total 0             Health Maintenance Due  Topic Date Due   HPV VACCINES (1 - 3-dose series) Never done   DTaP/Tdap/Td (1 - Tdap) Never done   INFLUENZA VACCINE  Never done   COVID-19 Vaccine (1 - 2023-24 season) Never done    The following portions of the patient's history were reviewed and updated as appropriate: allergies, current medications, past family history, past medical history, past social history, past surgical history, and  problem list.  Review of Systems Pertinent items noted in HPI and remainder of comprehensive ROS otherwise negative.  Objective:  BP 126/81   Pulse 73   Ht 5\' 3"  (1.6 m)   Wt 163 lb (73.9 kg)   LMP  (Approximate) Comment: December 2023  Breastfeeding No   BMI 28.87 kg/m    VS reviewed, nursing note reviewed,  Constitutional: well developed, well nourished, no distress HEENT: normocephalic CV: normal rate Pulm/chest wall: normal effort Abdomen: soft Neuro: alert and oriented x 3 Skin: warm, dry Psych: affect normal  Assessment:   1. Postpartum anxiety --Pt denies depression, reports Zoloft works well for that but does have episodes of anxiety and irritability.  Discussed options, including Buspar or Vistaril, changing Zoloft to Celexa or other medicine to better cover anxiety, or increasing current dose of Zoloft. --Pt to start a new contraceptive (see below) so will just increase Zoloft for now and f/u in 3 months. - sertraline (ZOLOFT) 50 MG tablet; Take 3 tablets (150 mg total) by mouth daily.  Dispense: 60 tablet; Refill: 11  2. Postpartum care following vaginal delivery --Doing well, bonding well with baby, good support at home   Plan:   Essential components of care per ACOG recommendations:  1.  Mood and well being: Patient with negative depression screening today.  Reviewed local resources for support.  - Patient tobacco use? No.   - hx of drug use? No.    2. Infant care and feeding:  -Patient currently breastmilk feeding? No.  -Social determinants of health (SDOH) reviewed in EPIC. No concerns   3. Sexuality, contraception and birth spacing - Patient does not want a pregnancy in the next year.   - Reviewed reproductive life planning. Reviewed contraceptive methods based on pt preferences and effectiveness.  Patient desired Vasectomy today.   - Discussed birth spacing of 18 months  4. Sleep and fatigue -Encouraged family/partner/community support of 4 hrs of  uninterrupted sleep to help with mood and fatigue  5. Physical Recovery  - Discussed patients delivery and complications. She describes her labor as good. - Patient had a Vaginal, no problems at delivery. Patient had  no  laceration. Perineal healing reviewed. Patient expressed understanding - Patient has urinary incontinence? No. - Patient is safe to resume physical and sexual activity  6.  Health Maintenance - HM due items addressed Yes - Last pap smear  Diagnosis  Date Value Ref Range Status  11/10/2020   Final   - Negative for intraepithelial lesion or malignancy (NILM)   Pap smear not done at today's visit.  -Breast Cancer screening indicated? No.   7. Chronic Disease/Pregnancy Condition follow up: None  - PCP follow up  Sharen Counter, CNM Center for [employer], [facility]

## 2023-03-11 ENCOUNTER — Encounter: Payer: Self-pay | Admitting: Advanced Practice Midwife

## 2023-03-21 MED ORDER — SERTRALINE HCL 100 MG PO TABS: 150.0000 mg | ORAL_TABLET | Freq: Every day | ORAL | 11 refills | Status: DC

## 2023-04-20 ENCOUNTER — Other Ambulatory Visit: Payer: Self-pay | Admitting: Advanced Practice Midwife

## 2023-04-20 DIAGNOSIS — Z3009 Encounter for other general counseling and advice on contraception: Secondary | ICD-10-CM

## 2023-04-20 DIAGNOSIS — F418 Other specified anxiety disorders: Secondary | ICD-10-CM

## 2023-04-20 MED ORDER — BUSPIRONE HCL 5 MG PO TABS: 5.0000 mg | ORAL_TABLET | Freq: Three times a day (TID) | ORAL | 2 refills | Status: DC

## 2023-04-20 MED ORDER — NEXTSTELLIS 3-14.2 MG PO TABS: ORAL_TABLET | ORAL | 4 refills | Status: AC

## 2023-04-20 NOTE — Progress Notes (Signed)
 Pt sent a MyChart message requesting Rx for Nexstellis, and renewal of Buspar for anxiety, which were discussed at postpartum visit on 03/07/23.  Rx sent, and MyChart message sent to patient. Pt to f/u in office in 2-3 months.

## 2023-05-12 ENCOUNTER — Other Ambulatory Visit: Payer: Self-pay

## 2023-05-12 MED ORDER — SOLOSEC 2 G PO PACK: 1.0000 | PACK | Freq: Once | ORAL | 0 refills | Status: AC

## 2023-05-12 NOTE — Progress Notes (Signed)
Sent solosec rx, per provider orders from staff message

## 2023-05-13 ENCOUNTER — Other Ambulatory Visit: Payer: Self-pay | Admitting: Obstetrics and Gynecology

## 2023-05-13 ENCOUNTER — Other Ambulatory Visit: Payer: Self-pay | Admitting: Advanced Practice Midwife

## 2023-05-13 DIAGNOSIS — F418 Other specified anxiety disorders: Secondary | ICD-10-CM

## 2023-05-14 ENCOUNTER — Other Ambulatory Visit: Payer: Self-pay | Admitting: Advanced Practice Midwife

## 2023-05-14 DIAGNOSIS — O99345 Other mental disorders complicating the puerperium: Secondary | ICD-10-CM

## 2023-05-14 MED ORDER — SERTRALINE HCL 100 MG PO TABS: 200.0000 mg | ORAL_TABLET | Freq: Every day | ORAL | 3 refills | Status: DC

## 2023-05-14 NOTE — Progress Notes (Signed)
Pt reported increased anxiety via MyChart message.  Pt is taking 150 daily of Zoloft. Discussed options and Zoloft dose increased to 200 mg daily and referral made for behavioral health.  F/U in office with me in 1-2 months.

## 2023-06-02 ENCOUNTER — Encounter: Payer: Self-pay | Admitting: Advanced Practice Midwife

## 2023-06-02 ENCOUNTER — Telehealth: Payer: Self-pay | Admitting: Advanced Practice Midwife

## 2023-06-02 ENCOUNTER — Other Ambulatory Visit: Payer: Self-pay | Admitting: Advanced Practice Midwife

## 2023-06-02 MED ORDER — BUSPIRONE HCL 10 MG PO TABS: 10.0000 mg | ORAL_TABLET | Freq: Three times a day (TID) | ORAL | 2 refills | Status: DC | PRN

## 2023-06-02 NOTE — Progress Notes (Signed)
Rx for Buspar increased to 10 mg TID PRN, see telephone call message

## 2023-06-02 NOTE — Telephone Encounter (Signed)
Pt sent Mychart message and I called to discuss patient concerns.  Pt wanted to speak directly with provider and not leave message for nursing/office staff to forward to provider regarding personal issues at home.  I have increased her Buspar Rx to 10 mg TID from 5 mg and sent a message for her to come to the office next week for a vaginal swab/STI and infection testing.  I also sent a list of behavioral health providers for patient to explore/make appt.  She already has a referral to [facility].  Pt with increased reasons for anxiety, but denies any thoughts of harming herself or others.

## 2023-06-07 ENCOUNTER — Encounter: Payer: Self-pay | Admitting: Advanced Practice Midwife

## 2023-06-07 ENCOUNTER — Other Ambulatory Visit (HOSPITAL_COMMUNITY)
Admission: RE | Admit: 2023-06-07 | Discharge: 2023-06-07 | Disposition: A | Discharge status: Home / Self Care | Payer: Medicaid Other | Source: Ambulatory Visit | Attending: Advanced Practice Midwife | Admitting: Advanced Practice Midwife

## 2023-06-07 ENCOUNTER — Ambulatory Visit: Payer: Medicaid Other | Admitting: Advanced Practice Midwife

## 2023-06-07 VITALS — BP 126/86 | HR 78 | Ht 63.0 in | Wt 164.0 lb

## 2023-06-07 DIAGNOSIS — N926 Irregular menstruation, unspecified: Secondary | ICD-10-CM | POA: Diagnosis not present

## 2023-06-07 DIAGNOSIS — Z202 Contact with and (suspected) exposure to infections with a predominantly sexual mode of transmission: Secondary | ICD-10-CM

## 2023-06-07 DIAGNOSIS — F418 Other specified anxiety disorders: Secondary | ICD-10-CM | POA: Diagnosis not present

## 2023-06-07 DIAGNOSIS — O99345 Other mental disorders complicating the puerperium: Secondary | ICD-10-CM

## 2023-06-07 LAB — POCT URINE PREGNANCY: Preg Test, Ur: NEGATIVE

## 2023-06-07 NOTE — Progress Notes (Unsigned)
   GYNECOLOGY PROGRESS NOTE  History:  23 y.o. [id number] presents to [facility] office today for problem gyn visit. She reports increased discharge with greenish color.  She recently found out about her husband being unfaithful.  She has filed for divorce and is tearful today but coping well. She is taking  Zoloft 150 mg, because pharmacy will not fill 200 mg recently prescribed yet.  She takes Buspar for breakthrough anxiety. She has a list of counselors and plans to make an appointment.  She denies h/a, dizziness, shortness of breath, n/v, or fever/chills.    The following portions of the patient's history were reviewed and updated as appropriate: allergies, current medications, past family history, past medical history, past social history, past surgical history and problem list. Last pap smear on 11/10/20 was normal.  Health Maintenance Due  Topic Date Due   HPV VACCINES (1 - 3-dose series) Never done   DTaP/Tdap/Td (1 - Tdap) Never done   INFLUENZA VACCINE  Never done   COVID-19 Vaccine (1 - 2024-25 season) Never done     Review of Systems:  Pertinent items are noted in HPI.   Objective:  Physical Exam Blood pressure 126/86, pulse 78, height 5\' 3"  (1.6 m), weight 164 lb (74.4 kg), last menstrual period 04/27/2023, not currently breastfeeding. VS reviewed, nursing note reviewed,  Constitutional: well developed, well nourished, no distress HEENT: normocephalic CV: normal rate Pulm/chest wall: normal effort Breast Exam: deferred Abdomen: soft Neuro: alert and oriented x 3 Skin: warm, dry Psych: affect normal Pelvic exam: Deferred. Self swab performed.   Assessment & Plan:  1. Encounter for assessment of STD exposure (Primary)  - Cervicovaginal ancillary only( [place]) - HIV antibody (with reflex) - RPR - Hepatitis C Antibody - Hepatitis B Surface AntiGEN  2. Irregular menses  - POCT urine pregnancy   3. Postpartum anxiety --Medications adjusted after recent  telephone/MyChart visits.  Provider to contact pharmacy and find out how to get 200 mg Zoloft filled and pt to increase and take 200 mg starting today.   --Make appt with counselor as soon as possible --Notify our office or seek help if symptoms worsen   No follow-ups on file.   Sharen Counter, CNM 1:16 PM

## 2023-06-07 NOTE — Progress Notes (Unsigned)
 Spotting for menses. OCs x 3 months c/o vaginal irritation with abnormal discharge green in color with odor at times. Concerned she is pregnant. Requesting test today.

## 2023-06-08 ENCOUNTER — Encounter: Payer: Self-pay | Admitting: Advanced Practice Midwife

## 2023-06-08 LAB — HIV ANTIBODY (ROUTINE TESTING W REFLEX): HIV Screen 4th Generation wRfx: NONREACTIVE

## 2023-06-08 LAB — CERVICOVAGINAL ANCILLARY ONLY
Bacterial Vaginitis (gardnerella): NEGATIVE
Candida Glabrata: NEGATIVE
Candida Vaginitis: NEGATIVE
Chlamydia: NEGATIVE
Comment: NEGATIVE
Comment: NEGATIVE
Comment: NEGATIVE
Comment: NEGATIVE
Comment: NEGATIVE
Comment: NORMAL
Neisseria Gonorrhea: NEGATIVE
Trichomonas: NEGATIVE

## 2023-06-08 LAB — HEPATITIS B SURFACE ANTIGEN: Hepatitis B Surface Ag: NEGATIVE

## 2023-06-08 LAB — HEPATITIS C ANTIBODY: Hep C Virus Ab: NONREACTIVE

## 2023-06-08 LAB — RPR: RPR Ser Ql: NONREACTIVE

## 2023-06-27 ENCOUNTER — Other Ambulatory Visit: Payer: Self-pay | Admitting: Advanced Practice Midwife

## 2023-07-18 ENCOUNTER — Ambulatory Visit (HOSPITAL_COMMUNITY): Payer: Self-pay | Admitting: Family

## 2023-07-19 ENCOUNTER — Ambulatory Visit (HOSPITAL_COMMUNITY): Admitting: Family

## 2023-07-19 DIAGNOSIS — F4323 Adjustment disorder with mixed anxiety and depressed mood: Secondary | ICD-10-CM

## 2023-07-19 DIAGNOSIS — F331 Major depressive disorder, recurrent, moderate: Secondary | ICD-10-CM

## 2023-07-19 MED ORDER — ARIPIPRAZOLE 2 MG PO TABS: 2.0000 mg | ORAL_TABLET | Freq: Every day | ORAL | 0 refills | Status: DC

## 2023-07-19 NOTE — Progress Notes (Cosign Needed Addendum)
 Virtual Visit via Video Note  I connected with Angela Calderon on 07/19/23 at  7:00 AM EDT by a video enabled telemedicine application and verified that I am speaking with the correct person using two identifiers.  Location: Patient: Home Provider: Home Office    I discussed the limitations of evaluation and management by telemedicine and the availability of in person appointments. The patient expressed understanding and agreed to proceed.    I discussed the assessment and treatment plan with the patient. The patient was provided an opportunity to ask questions and all were answered. The patient agreed with the plan and demonstrated an understanding of the instructions.   The patient was advised to call back or seek an in-person evaluation if the symptoms worsen or if the condition fails to improve as anticipated.  I provided 20 minutes of non-face-to-face time during this encounter.   Angela Rack, NP    Psychiatric Initial Adult Assessment   Patient Identification: Angela Calderon MRN:  [id number] Date of Evaluation:  07/19/2023 Referral Source: Primary care provider- Sharen Counter Chief Complaint: Intrusive thoughts and mood irritability Visit Diagnosis:    ICD-10-CM   1. Adjustment disorder with mixed anxiety and depressed mood  F43.23       History of Present Illness:  Angela Calderon 23 year old female presents to establish care.  She was seen and evaluated via care agility.  She reports she was referred by her primary care provider due to mood irritability, worsening depression and intrusive thoughts.  Reports she is currently prescribed Zoloft 200 mg.  In recent was initiated on BuSpar however,  has been taking intermittently. "As needed" Angela Calderon reported her primary care provider told her to start taking BuSpar 3 times daily.  She reports a history related to postpartum anxiety, depression and possibly obsessive-compulsive disorder?  Denied that she is ever been  diagnosed with attention deficit disorder.  Does report poor concentration and inability to focus at times.  Angela Calderon reports multiple psychosocial stressors that she is dealing with currently.  States she has 2 children under 2.  States she has a 6-year-old and a 30-month-old child.  States she is had custody of her 39 year old brother since she has been 62 years old.  reports both of her parents struggle with substance abuse.  Denied additional family support.  Reported a recent break-up.  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  Reports intrusive thoughts related to wanting to bang her head into the table due to frustration and feeling overwhelmed.  States that she often is short tempered with her children.  Angela Calderon denies that she is ever been followed by therapy or psychiatry in the past.  Denied previous inpatient admissions.  Denied history related to self injures behaviors or suicide attempts.  Reports occasional alcohol use.  Denied illicit drug use. Chart reviewed documented patient has tried Celexa,Vistaril and Buspar.    Angela Calderon is walking around her house. she is alert/oriented x 4; calm/cooperative; and mood congruent with affect.  Patient is speaking in a clear tone at moderate volume, and normal pace; with good eye contact.  Discussed initiating Abilify 2 mg daily.  Denied that she is currently breast-feeding at this time.  Her thought process is coherent and relevant; There is no indication that she is currently responding to internal/external stimuli or experiencing delusional thought content.  Patient denies suicidal/self-harm/homicidal ideation, psychosis, and paranoia.  Patient has remained calm throughout  assessment and has answered questions appropriately.   Associated Signs/Symptoms: Depression Symptoms:  depressed mood, insomnia, difficulty concentrating, (Hypo) Manic Symptoms:  Distractibility, Anxiety  Symptoms:  Excessive Worry, Psychotic Symptoms:  Hallucinations: None PTSD Symptoms: NA  Past Psychiatric History:   Previous Psychotropic Medications: Yes   Substance Abuse History in the last 12 months:  No.  Consequences of Substance Abuse: NA  Past Medical History:  Past Medical History:  Diagnosis Date   Asthma    Contraception management 09/05/2020   Gestational hypertension 07/01/2021   History of miscarriage 11/25/2020   Itching 05/15/2021   normal bile acids 05/15/21   Pregnancy induced hypertension    UTI (urinary tract infection)     Past Surgical History:  Procedure Laterality Date   TONSILLECTOMY AND ADENOIDECTOMY Bilateral 09/29/2012   Procedure: TONSILLECTOMY AND ADENOIDECTOMY;  Surgeon: Melvenia Beam, MD;  Location: [facility] OR;  Service: ENT;  Laterality: Bilateral;    Family Psychiatric History: Mother and father- substance abuse   Family History:  Family History  Problem Relation Age of Onset   Cervical cancer Mother    Ovarian cancer Paternal Grandmother    Diabetes Paternal Grandfather    Congestive Heart Failure Paternal Grandfather     Social History:   Social History   Socioeconomic History   Marital status: Single    Spouse name: Not on file   Number of children: Not on file   Years of education: Not on file   Highest education level: Not on file  Occupational History   Not on file  Tobacco Use   Smoking status: Never    Passive exposure: Yes   Smokeless tobacco: Never  Vaping Use   Vaping status: Never Used  Substance and Sexual Activity   Alcohol use: Not Currently   Drug use: No   Sexual activity: Yes    Partners: Male    Comment: last ic 1 week ago  Other Topics Concern   Not on file  Social History Narrative   ** Merged History Encounter **       Social Drivers of Health   Financial Resource Strain: Not on file  Food Insecurity: No Food Insecurity (02/04/2023)   Hunger Vital Sign    Worried About Running Out of Food in  the Last Year: Never true    Ran Out of Food in the Last Year: Never true  Transportation Needs: No Transportation Needs (02/04/2023)   PRAPARE - Administrator, Civil Service (Medical): No    Lack of Transportation (Non-Medical): No  Physical Activity: Not on file  Stress: Not on file  Social Connections: Unknown (08/25/2021)   [facility]   Social Network    Social Network: Not on file    Additional Social History:   Allergies:   Allergies  Allergen Reactions   Latex Itching    Metabolic Disorder Labs: No results found for: "HGBA1C", "MPG" No results found for: "PROLACTIN" No results found for: "CHOL", "TRIG", "HDL", "CHOLHDL", "VLDL", "LDLCALC" No results found for: "TSH"  Therapeutic Level Labs: No results found for: "LITHIUM" No results found for: "CBMZ" No results found for: "VALPROATE"  Current Medications: Current Outpatient Medications  Medication Sig Dispense Refill   acetaminophen (TYLENOL) 325 MG tablet Take 2 tablets (650 mg total) by mouth every 4 (four) hours as needed (for pain scale < 4). 60 tablet 0   busPIRone (BUSPAR) 10 MG tablet Take 1 tablet (10 mg total) by mouth 3 (three) times  daily as needed. 90 tablet 2   Drospirenone-Estetrol (NEXTSTELLIS) 3-14.2 MG TABS Take 1 tablet daily 84 tablet 4   ibuprofen (ADVIL) 600 MG tablet Take 1 tablet (600 mg total) by mouth every 6 (six) hours. (Patient not taking: Reported on 06/07/2023) 30 tablet 0   sertraline (ZOLOFT) 100 MG tablet Take 2 tablets (200 mg total) by mouth daily. 60 tablet 3   No current facility-administered medications for this visit.    Musculoskeletal: Strength & Muscle Tone: within normal limits Gait & Station: normal Patient leans: N/A  Psychiatric Specialty Exam: Review of Systems  Psychiatric/Behavioral:  Positive for decreased concentration and sleep disturbance. The patient is nervous/anxious.   All other systems reviewed and are  negative.   not currently breastfeeding.There is no height or weight on file to calculate BMI.  General Appearance: Casual  Eye Contact:  Good  Speech:  Clear and Coherent  Volume:  Normal  Mood:  Anxious and Depressed  Affect:  Congruent  Thought Process:  Coherent  Orientation:  Full (Time, Place, and Person)  Thought Content:  Logical  Suicidal Thoughts:  No  Homicidal Thoughts:  No  Memory:  Immediate;   Good Recent;   Good  Judgement:  Good  Insight:  Good  Psychomotor Activity:  Normal  Concentration:  Concentration: Good  Recall:  Good  Fund of Knowledge:Good  Language: Good  Akathisia:  No  Handed:  Right  AIMS (if indicated):  not done  Assets:  Communication Skills  ADL's:  Intact  Cognition: WNL  Sleep:  Fair reported she is up and and down with sleep due to her children   Screenings: GAD-7    Flowsheet Row Routine Prenatal from 11/29/2022 in [facility] for [facility] at [place] Initial Prenatal from 08/25/2022 in [facility] for [facility] at [place] Clinical Support from 08/09/2022 in [facility] for [facility] Healthcare at [facility] Routine Prenatal from 05/15/2021 in [facility] for [facility] at [place]  Total GAD-7 Score 0 1 3 0      PHQ2-9    Flowsheet Row Office Visit from [facility]/20[facility] in [facility] Routine Prenatal from 11/29/2022 in [facility] for [facility] at [place] Initial Prenatal from 08/25/2022 in [facility] for [facility] Healthcare at [place] Clinical Support from 08/09/2022 in [facility] for [facility] Healthcare at [facility] Routine Prenatal from 05/15/2021 in [facility] for [facility] at [facility] Total Score 2 0 0 0 0  PHQ-9 Total Score -- 0 3 0 4      Flowsheet Row Admission (Discharged) from 02/04/2023 in [place] 5S Mother Baby Unit Admission (Discharged) from 01/27/2023 in [place] 1S Maternity Assessment Unit Admission  (Discharged) from 12/26/2022 in [facility] 1S Maternity Assessment Unit  C-SSRS RISK CATEGORY No Risk No Risk No Risk       Assessment and Plan: Angela Calderon 23 year old female presents to establish care.  Reports she was referred by her primary care provider due to multiple stressors.  States she is currently prescribed Zoloft 200 mg and BuSpar 15 mg 3 times daily.  States she does not feel that the medication is helping with her mood or her symptoms.  States her most pressing symptoms is related to feelings of being overwhelmed and frustrated.  Discussed consideration for following up with therapy services as opposed to medication adjustment.  Patient reports she is interested in adjunct medication.  Will follow-up in office for GeneSight testing.  Collaboration of Care: Medication Management AEB Abilify 2 mg -Consideration for therapy services i.e. parenting support groups -Follow-up GeneSight testing -Follow-up in office 1 month   Patient/Guardian was advised Release of Information must be obtained prior to any record release in order to collaborate their care with an outside provider. Patient/Guardian was advised if they have not already done so to contact the registration department to sign all necessary forms in order for [place] to release information regarding their care.   Consent: Patient/Guardian gives verbal consent for treatment and assignment of benefits for services provided during this visit. Patient/Guardian expressed understanding and agreed to proceed.   Angela Rack, NP 4/8/20257:23 AM

## 2023-08-25 ENCOUNTER — Other Ambulatory Visit (HOSPITAL_COMMUNITY): Payer: Self-pay | Admitting: Family

## 2023-08-30 ENCOUNTER — Other Ambulatory Visit (HOSPITAL_COMMUNITY): Payer: Self-pay

## 2023-08-30 MED ORDER — ARIPIPRAZOLE 2 MG PO TABS
2.0000 mg | ORAL_TABLET | Freq: Every day | ORAL | 0 refills | Status: DC
Start: 2023-08-30 — End: 2023-09-14

## 2023-09-14 ENCOUNTER — Telehealth (HOSPITAL_COMMUNITY): Admitting: Family

## 2023-09-14 DIAGNOSIS — F331 Major depressive disorder, recurrent, moderate: Secondary | ICD-10-CM

## 2023-09-14 MED ORDER — ARIPIPRAZOLE 5 MG PO TABS: 5.0000 mg | ORAL_TABLET | Freq: Every day | ORAL | 0 refills | Status: DC

## 2023-09-14 NOTE — Progress Notes (Addendum)
 Virtual Visit via Video Note  I connected with Angela Calderon on 09/14/23 at  8:00 AM EDT by a video enabled telemedicine application and verified that I am speaking with the correct person using two identifiers.  Location: Patient: Set designer Provider: Office  I discussed the limitations of evaluation and management by telemedicine and the availability of in person appointments. The patient expressed understanding and agreed to proceed.   I discussed the assessment and treatment plan with the patient. The patient was provided an opportunity to ask questions and all were answered. The patient agreed with the plan and demonstrated an understanding of the instructions.   The patient was advised to call back or seek an in-person evaluation if the symptoms worsen or if the condition fails to improve as anticipated.  I provided 15 minutes of non-face-to-face time during this encounter.   Levester Reagin, NP   [facility] MD/PA/NP OP Progress Note  09/14/2023 8:45 AM Angela Calderon  MRN:  [id number]  Chief Complaint: Medication management  HPI: Angela Calderon 23 year old female who presents for medication management follow-up appointment.  Currently she is prescribed Zoloft  200 mg daily which she reports she has been taking and tolerating well.  Per previous visit patient was initiated on Abilify  2 mg for adjunct therapy she reports " I think it is helping him in a better place."  Reports some ongoing lingering depression.  Amendable to increasing Abilify  2 mg to Abilify  5 mg daily.    Angela Calderon Reports some sleep disturbance however she attributes that due to her children.  States day-to-day functions have improved.  No concerns related to suicidal or homicidal ideations.  Denies auditory or visual hallucinations.  States she was unable to follow-up with [employer] testing as she states she started to feel better on medication so she would like to hold off on additional testing at this time.  Discussed  following up 3 months for medication management adherence/tolerability.  Support, encouragement and reassurance was provided.   Visit Diagnosis:    ICD-10-CM   1. Moderate episode of recurrent major depressive disorder (HCC)  F33.1       Past Psychiatric History:   Past Medical History:  Past Medical History:  Diagnosis Date   Asthma    Contraception management 09/05/2020   Gestational hypertension 07/01/2021   History of miscarriage 11/25/2020   Itching 05/15/2021   normal bile acids 05/15/21   Pregnancy induced hypertension    UTI (urinary tract infection)     Past Surgical History:  Procedure Laterality Date   TONSILLECTOMY AND ADENOIDECTOMY Bilateral 09/29/2012   Procedure: TONSILLECTOMY AND ADENOIDECTOMY;  Surgeon: Littie Rife, MD;  Location: [facility] OR;  Service: ENT;  Laterality: Bilateral;    Family Psychiatric History:   Family History:  Family History  Problem Relation Age of Onset   Cervical cancer Mother    Ovarian cancer Paternal Grandmother    Diabetes Paternal Grandfather    Congestive Heart Failure Paternal Grandfather     Social History:  Social History   Socioeconomic History   Marital status: Single    Spouse name: Not on file   Number of children: Not on file   Years of education: Not on file   Highest education level: Not on file  Occupational History   Not on file  Tobacco Use   Smoking status: Never    Passive exposure: Yes   Smokeless tobacco: Never  Vaping Use   Vaping status: Never Used  Substance and Sexual Activity  Alcohol use: Not Currently   Drug use: No   Sexual activity: Yes    Partners: Male    Comment: last ic 1 week ago  Other Topics Concern   Not on file  Social History Narrative   ** Merged History Encounter **       Social Drivers of Health   Financial Resource Strain: Not on file  Food Insecurity: No Food Insecurity (02/04/2023)   Hunger Vital Sign    Worried About Running Out of Food in the Last Year: Never  true    Ran Out of Food in the Last Year: Never true  Transportation Needs: No Transportation Needs (02/04/2023)   PRAPARE - Administrator, Civil Service (Medical): No    Lack of Transportation (Non-Medical): No  Physical Activity: Not on file  Stress: Not on file  Social Connections: Unknown (08/25/2021)   [facility]   Social Network    Social Network: Not on file    Allergies:  Allergies  Allergen Reactions   Latex Itching    Metabolic Disorder Labs: No results found for: "HGBA1C", "MPG" No results found for: "PROLACTIN" No results found for: "CHOL", "TRIG", "HDL", "CHOLHDL", "VLDL", "LDLCALC" No results found for: "TSH"  Therapeutic Level Labs: No results found for: "LITHIUM" No results found for: "VALPROATE" No results found for: "CBMZ"  Current Medications: Current Outpatient Medications  Medication Sig Dispense Refill   acetaminophen  (TYLENOL ) 325 MG tablet Take 2 tablets (650 mg total) by mouth every 4 (four) hours as needed (for pain scale < 4). 60 tablet 0   ARIPiprazole  (ABILIFY ) 2 MG tablet Take 1 tablet (2 mg total) by mouth daily. 30 tablet 0   busPIRone  (BUSPAR ) 10 MG tablet Take 1 tablet (10 mg total) by mouth 3 (three) times daily as needed. 90 tablet 2   Drospirenone -Estetrol (NEXTSTELLIS ) 3-14.2 MG TABS Take 1 tablet daily 84 tablet 4   ibuprofen  (ADVIL ) 600 MG tablet Take 1 tablet (600 mg total) by mouth every 6 (six) hours. (Patient not taking: Reported on 06/07/2023) 30 tablet 0   sertraline  (ZOLOFT ) 100 MG tablet Take 2 tablets (200 mg total) by mouth daily. 60 tablet 3   No current facility-administered medications for this visit.     Musculoskeletal: Virtual assessment Psychiatric Specialty Exam: Review of Systems  not currently breastfeeding.There is no height or weight on file to calculate BMI.  General Appearance: Casual  Eye Contact:  Good  Speech:  Clear and Coherent  Volume:  Normal  Mood:   Anxious and Depressed  Affect:  Congruent  Thought Process:  Coherent  Orientation:  Full (Time, Place, and Person)  Thought Content: Logical   Suicidal Thoughts:  No  Homicidal Thoughts:  No  Memory:  Recent;   Good Remote;   Good  Judgement:  Good  Insight:  Good  Psychomotor Activity:  Normal  Concentration:  Concentration: Good  Recall:  Good  Fund of Knowledge: Good  Language: Good  Akathisia:  No  Handed:  Right  AIMS (if indicated): done  Assets:  Communication Skills Desire for Improvement Resilience Social Support  ADL's:  Intact  Cognition: WNL  Sleep:  Fair   Screenings: GAD-7    Flowsheet Row Routine Prenatal from 11/[facility]/2022 in [facility] Healthcare at [place] Initial Prenatal from 08/25/2022 in [facility] Healthcare at [place] Clinical Support from 08/09/2022 in [facility] for [facility] Healthcare at [facility] Routine Prenatal from  05/15/2021 in [facility] for [facility] Healthcare at [facility]  Total GAD-7 Score 0 1 3 0      PHQ2-9    Flowsheet Row [facility] Routine Prenatal from 11/29/2022 [facility] Healthcare at [facility] Initial Prenatal from 08/25/2022 in [facility] for [facility] at [facility] Clinical Support from 08/09/2022 in [facility] for [facility] at [facility] Routine Prenatal from 05/15/2021 in [facility] for [facility] Healthcare at [facility] Total Score 2 0 0 0 0  PHQ-9 Total Score -- 0 3 0 4      Flowsheet Row Admission (Discharged) from 02/04/2023 in [place] 5S Mother Baby Unit Admission (Discharged) from 01/27/2023 in [place] 1S Maternity Assessment Unit Admission (Discharged) from 12/26/2022 in [facility] 1S Maternity Assessment Unit  C-SSRS RISK CATEGORY No Risk No Risk No Risk        Assessment and Plan: Angela Calderon 23 year old Caucasian female presents for medication management  follow-up appointment.  Currently prescribed Zoloft  200 mg daily adjunct therapy with Abilify  2 mg which she reports she has been taking tolerating well.  States she could use slight increase in Abilify .  Discussed titration with Abilify  2 mg to 5 mg daily.  Patient to follow-up 3 months for medication management appointment.  No concerns related to suicidal or homicidal ideations.  Denies auditory or visual hallucinations.  Collaboration of Care: Collaboration of Care: Medication Management AEB continue Zoloft  200 mg daily and Abilify  5 mg daily  Patient/Guardian was advised Release of Information must be obtained prior to any record release in order to collaborate their care with an outside provider. Patient/Guardian was advised if they have not already done so to contact the registration department to sign all necessary forms in order for us  to release information regarding their care.   Consent: Patient/Guardian gives verbal consent for treatment and assignment of benefits for services provided during this visit. Patient/Guardian expressed understanding and agreed to proceed.    Levester Reagin, NP 09/14/2023, 8:45 AM

## 2023-11-04 ENCOUNTER — Encounter: Payer: Self-pay | Admitting: Advanced Practice Midwife

## 2023-11-06 DIAGNOSIS — N3000 Acute cystitis without hematuria: Secondary | ICD-10-CM | POA: Diagnosis not present

## 2023-11-06 DIAGNOSIS — N390 Urinary tract infection, site not specified: Secondary | ICD-10-CM | POA: Diagnosis not present

## 2023-12-14 ENCOUNTER — Telehealth (HOSPITAL_BASED_OUTPATIENT_CLINIC_OR_DEPARTMENT_OTHER): Admitting: Family

## 2023-12-14 DIAGNOSIS — F331 Major depressive disorder, recurrent, moderate: Secondary | ICD-10-CM

## 2023-12-14 DIAGNOSIS — F411 Generalized anxiety disorder: Secondary | ICD-10-CM

## 2023-12-14 MED ORDER — HYDROXYZINE HCL 10 MG PO TABS: 10.0000 mg | ORAL_TABLET | Freq: Three times a day (TID) | ORAL | 0 refills | Status: AC | PRN

## 2023-12-14 NOTE — Progress Notes (Signed)
 Virtual Visit via Video Note  I connected with Angela Calderon on 12/14/23 at  8:30 AM EDT by a video enabled telemedicine application and verified that I am speaking with the correct person using two identifiers.  Location: Patient: Home  Provider: Office    I discussed the limitations of evaluation and management by telemedicine and the availability of in person appointments. The patient expressed understanding and agreed to proceed.  I discussed the assessment and treatment plan with the patient. The patient was provided an opportunity to ask questions and all were answered. The patient agreed with the plan and demonstrated an understanding of the instructions.   The patient was advised to call back or seek an in-person evaluation if the symptoms worsen or if the condition fails to improve as anticipated.  I provided 15 minutes of non-face-to-face time during this encounter.   Staci LOISE Kerns, NP   [facility] MD/PA/NP OP Progress Note  12/14/2023 10:25 AM Angela Calderon  MRN:  [id number]  Chief Complaint: medication management    HPI: Angela Calderon 23 year old female who presents for medication management follow-up appointment.  Per previous visit patient Abilify  2 mg was increased to 5 mg.  Which she reports she has been taking tolerating well.  States overall her mood depression has improved.  Does have additional concerns with panic/anxiety attacks that occur roughly once a week.   I get overwhelmed and stimulated roughly once a week.  states she is seeking medication to help with her symptoms.  Discussed following up with therapy services for additional coping skills.  States she does not have time during her day as she has 2 children under 34 years old.  Coupled with attending classes.  Discussed consideration for therapy in the future.  She was receptive to plan.  Discussed initiating hydroxyzine  10 to 20 mg as needed as needed.  Patient was amendable to plan.  No concerns  related to suicidal or homicidal ideations.  Sherryle does have concerns related to excessive weight gain.  Discussed following up with primary care for additional workup i.e. thyroid disorder and/or any other metabolic disorders that may be affected by excessive weight gain.  May need referral to nutritionist after she is seen by her primary care provider.  She was receptive to plan.  Patient to follow-up 4 months for medication management appointment.  Reports a good appetite.  States she is rested well throughout the night.  Support,  and encouragement reassurance was provided.   Visit Diagnosis:    ICD-10-CM   1. Moderate episode of recurrent major depressive disorder (HCC)  F33.1     2. GAD (generalized anxiety disorder)  F41.1       Past Psychiatric History:   Past Medical History:  Past Medical History:  Diagnosis Date   Asthma    Contraception management 09/05/2020   Gestational hypertension 07/01/2021   History of miscarriage 11/25/2020   Itching 05/15/2021   normal bile acids 05/15/21   Pregnancy induced hypertension    UTI (urinary tract infection)     Past Surgical History:  Procedure Laterality Date   TONSILLECTOMY AND ADENOIDECTOMY Bilateral 09/29/2012   Procedure: TONSILLECTOMY AND ADENOIDECTOMY;  Surgeon: Merilee Kraft, MD;  Location: [facility] OR;  Service: ENT;  Laterality: Bilateral;    Family Psychiatric History:   Family History:  Family History  Problem Relation Age of Onset   Cervical cancer Mother    Ovarian cancer Paternal Grandmother    Diabetes Paternal Grandfather    Congestive  Heart Failure Paternal Grandfather     Social History:  Social History   Socioeconomic History   Marital status: Single    Spouse name: Not on file   Number of children: Not on file   Years of education: Not on file   Highest education level: Not on file  Occupational History   Not on file  Tobacco Use   Smoking status: Never    Passive exposure: Yes   Smokeless  tobacco: Never  Vaping Use   Vaping status: Never Used  Substance and Sexual Activity   Alcohol use: Not Currently   Drug use: No   Sexual activity: Yes    Partners: Male    Comment: last ic 1 week ago  Other Topics Concern   Not on file  Social History Narrative   ** Merged History Encounter **       Social Drivers of Health   Financial Resource Strain: Not on file  Food Insecurity: No Food Insecurity (02/04/2023)   Hunger Vital Sign    Worried About Running Out of Food in the Last Year: Never true    Ran Out of Food in the Last Year: Never true  Transportation Needs: No Transportation Needs (02/04/2023)   PRAPARE - Administrator, Civil Service (Medical): No    Lack of Transportation (Non-Medical): No  Physical Activity: Not on file  Stress: Not on file  Social Connections: Unknown (08/25/2021)   [facility] from [facility]   Social Network    Social Network: Not on file    Allergies:  Allergies  Allergen Reactions   Latex Itching    Metabolic Disorder Labs: No results found for: HGBA1C, MPG No results found for: PROLACTIN No results found for: CHOL, TRIG, HDL, CHOLHDL, VLDL, LDLCALC No results found for: TSH  Therapeutic Level Labs: No results found for: LITHIUM No results found for: VALPROATE No results found for: CBMZ  Current Medications: Current Outpatient Medications  Medication Sig Dispense Refill   hydrOXYzine  (ATARAX ) 10 MG tablet Take 1 tablet (10 mg total) by mouth 3 (three) times daily as needed. 60 tablet 0   acetaminophen  (TYLENOL ) 325 MG tablet Take 2 tablets (650 mg total) by mouth every 4 (four) hours as needed (for pain scale < 4). 60 tablet 0   ARIPiprazole  (ABILIFY ) 5 MG tablet Take 1 tablet (5 mg total) by mouth daily. 90 tablet 0   busPIRone  (BUSPAR ) 10 MG tablet Take 1 tablet (10 mg total) by mouth 3 (three) times daily as needed. 90 tablet 2   Drospirenone -Estetrol (NEXTSTELLIS ) 3-14.2 MG TABS Take  1 tablet daily 84 tablet 4   ibuprofen  (ADVIL ) 600 MG tablet Take 1 tablet (600 mg total) by mouth every 6 (six) hours. (Patient not taking: Reported on 06/07/2023) 30 tablet 0   sertraline  (ZOLOFT ) 100 MG tablet Take 2 tablets (200 mg total) by mouth daily. 60 tablet 3   No current facility-administered medications for this visit.     Musculoskeletal: Virtual assessment  Psychiatric Specialty Exam: Review of Systems  not currently breastfeeding.There is no height or weight on file to calculate BMI.  General Appearance: Casual  Eye Contact:  Good  Speech:  Clear and Coherent  Volume:  Normal  Mood:  Anxious and Depressed  Affect:  Congruent  Thought Process:  Coherent  Orientation:  Full (Time, Place, and Person)  Thought Content: Logical   Suicidal Thoughts:  No  Homicidal Thoughts:  No  Memory:  Immediate;   Good  Recent;   Good  Judgement:  Good  Insight:  Good  Psychomotor Activity:  Normal  Concentration:  Concentration: Good  Recall:  Good  Fund of Knowledge: Good  Language: Good  Akathisia:  No  Handed:  Right  AIMS (if indicated): done  Assets:  Communication Skills Desire for Improvement  ADL's:  Intact  Cognition: WNL  Sleep:  Good   Screenings: GAD-7    Flowsheet Row Routine Prenatal from 11/29/2022 in [facility] for [facility] Healthcare at [place] Initial Prenatal from 08/25/2022 in [facility] for [facility] Healthcare at [place] Clinical Support from 08/09/2022 [facility] Healthcare at [facility] Routine Prenatal from 05/15/2021 in [facility] for [facility] Healthcare at [place]  Total GAD-7 Score 0 1 3 0   PHQ2-9    Flowsheet Row Office Visit [facility] Routine Prenatal from 11/29/2022 in [facility] for [facility] Healthcare at [place] Initial Prenatal from 08[facility]25/2022 in [facility] for [facility] Healthcare at [place] Clinical Support from 08/09/2022 in [facility] for [facility] Healthcare at [facility] Routine Prenatal from 05/15/2021 in [facility] at [facility] Total Score 2 0 0 0 0  PHQ-9 Total Score -- 0 3 0 4   Flowsheet Row Admission (Discharged) from 02/04/2023 in [place] 5S Mother Baby Unit Admission (Discharged) from 01/27/2023 in [place] 1S Maternity Assessment Unit Admission (Discharged) from 12/26/2022 in [facility] 1S Maternity Assessment Unit  C-SSRS RISK CATEGORY No Risk No Risk No Risk     Assessment and Plan:  Angela Calderon is a 23 year old female who presents for medication management follow-up appointment.  Per previous assessment patient was initiated and increased on Abilify  which she reports she has been taking and tolerating well.  States she feels her depression symptoms are under control with new medication regimen.  No concerns related to suicidal or homicidal ideations.  Does have concerns with excessive weight gain.  Discussed following up with primary care to rule out any metabolic disorders and monitoring caloric intake.  She appeared receptive to plan.  Support, encouragement and reassurance was provided.  Collaboration of Care: Collaboration of Care: Medication Management AEB continue Abilify  5 mg and Zoloft  200 mg daily follow-up 4 months for medication adherence/tolerability.  Patient/Guardian was advised Release of Information must be obtained prior to any record release in order to collaborate their care with an outside provider. Patient/Guardian was advised if they have not already done so to contact the registration department to sign all necessary forms in order for us  to release information regarding their care.   Consent: Patient/Guardian gives verbal consent for treatment and assignment of benefits for services provided during this visit. Patient/Guardian expressed understanding and agreed to proceed.    Staci LOISE Kerns, NP 12/14/2023, 10:25 AM

## 2024-01-10 ENCOUNTER — Encounter: Payer: Self-pay | Admitting: Advanced Practice Midwife

## 2024-01-11 ENCOUNTER — Other Ambulatory Visit: Payer: Self-pay | Admitting: Advanced Practice Midwife

## 2024-01-11 MED ORDER — SERTRALINE HCL 100 MG PO TABS: 200.0000 mg | ORAL_TABLET | Freq: Every day | ORAL | 6 refills | Status: AC

## 2024-01-11 MED ORDER — SERTRALINE HCL 100 MG PO TABS: 200.0000 mg | ORAL_TABLET | Freq: Every day | ORAL | 6 refills | Status: DC

## 2024-01-13 DIAGNOSIS — Z6833 Body mass index (BMI) 33.0-33.9, adult: Secondary | ICD-10-CM | POA: Diagnosis not present

## 2024-01-13 DIAGNOSIS — E559 Vitamin D deficiency, unspecified: Secondary | ICD-10-CM | POA: Diagnosis not present

## 2024-01-13 DIAGNOSIS — R635 Abnormal weight gain: Secondary | ICD-10-CM | POA: Diagnosis not present

## 2024-01-13 DIAGNOSIS — Z131 Encounter for screening for diabetes mellitus: Secondary | ICD-10-CM | POA: Diagnosis not present

## 2024-01-13 DIAGNOSIS — Z1329 Encounter for screening for other suspected endocrine disorder: Secondary | ICD-10-CM | POA: Diagnosis not present

## 2024-01-13 DIAGNOSIS — E782 Mixed hyperlipidemia: Secondary | ICD-10-CM | POA: Diagnosis not present

## 2024-01-13 DIAGNOSIS — Z130 Encounter for screening for diseases of the blood and blood-forming organs and certain disorders involving the immune mechanism: Secondary | ICD-10-CM | POA: Diagnosis not present

## 2024-01-17 DIAGNOSIS — Z1331 Encounter for screening for depression: Secondary | ICD-10-CM | POA: Diagnosis not present

## 2024-01-17 DIAGNOSIS — Z6832 Body mass index (BMI) 32.0-32.9, adult: Secondary | ICD-10-CM | POA: Diagnosis not present

## 2024-01-17 DIAGNOSIS — Z1339 Encounter for screening examination for other mental health and behavioral disorders: Secondary | ICD-10-CM | POA: Diagnosis not present

## 2024-01-17 DIAGNOSIS — E559 Vitamin D deficiency, unspecified: Secondary | ICD-10-CM | POA: Diagnosis not present

## 2024-01-17 DIAGNOSIS — E782 Mixed hyperlipidemia: Secondary | ICD-10-CM | POA: Diagnosis not present

## 2024-01-17 DIAGNOSIS — R635 Abnormal weight gain: Secondary | ICD-10-CM | POA: Diagnosis not present

## 2024-01-20 DIAGNOSIS — Z6832 Body mass index (BMI) 32.0-32.9, adult: Secondary | ICD-10-CM | POA: Diagnosis not present

## 2024-01-20 DIAGNOSIS — E782 Mixed hyperlipidemia: Secondary | ICD-10-CM | POA: Diagnosis not present

## 2024-01-25 DIAGNOSIS — Z6832 Body mass index (BMI) 32.0-32.9, adult: Secondary | ICD-10-CM | POA: Diagnosis not present

## 2024-01-25 DIAGNOSIS — E782 Mixed hyperlipidemia: Secondary | ICD-10-CM | POA: Diagnosis not present

## 2024-02-03 DIAGNOSIS — E559 Vitamin D deficiency, unspecified: Secondary | ICD-10-CM | POA: Diagnosis not present

## 2024-02-03 DIAGNOSIS — Z6831 Body mass index (BMI) 31.0-31.9, adult: Secondary | ICD-10-CM | POA: Diagnosis not present

## 2024-02-10 DIAGNOSIS — E782 Mixed hyperlipidemia: Secondary | ICD-10-CM | POA: Diagnosis not present

## 2024-02-10 DIAGNOSIS — Z6831 Body mass index (BMI) 31.0-31.9, adult: Secondary | ICD-10-CM | POA: Diagnosis not present

## 2024-02-10 DIAGNOSIS — E559 Vitamin D deficiency, unspecified: Secondary | ICD-10-CM | POA: Diagnosis not present

## 2024-02-14 ENCOUNTER — Ambulatory Visit: Admitting: Advanced Practice Midwife

## 2024-02-17 DIAGNOSIS — E782 Mixed hyperlipidemia: Secondary | ICD-10-CM | POA: Diagnosis not present

## 2024-02-17 DIAGNOSIS — Z6832 Body mass index (BMI) 32.0-32.9, adult: Secondary | ICD-10-CM | POA: Diagnosis not present

## 2024-02-24 DIAGNOSIS — Z6831 Body mass index (BMI) 31.0-31.9, adult: Secondary | ICD-10-CM | POA: Diagnosis not present

## 2024-02-24 DIAGNOSIS — E559 Vitamin D deficiency, unspecified: Secondary | ICD-10-CM | POA: Diagnosis not present

## 2024-02-28 ENCOUNTER — Other Ambulatory Visit (HOSPITAL_COMMUNITY): Payer: Self-pay | Admitting: *Deleted

## 2024-02-28 MED ORDER — ARIPIPRAZOLE 5 MG PO TABS: 5.0000 mg | ORAL_TABLET | Freq: Every day | ORAL | 0 refills | Status: DC

## 2024-03-13 ENCOUNTER — Telehealth (HOSPITAL_COMMUNITY): Payer: Self-pay | Admitting: Family

## 2024-03-13 DIAGNOSIS — F411 Generalized anxiety disorder: Secondary | ICD-10-CM

## 2024-03-13 DIAGNOSIS — F331 Major depressive disorder, recurrent, moderate: Secondary | ICD-10-CM

## 2024-03-13 MED ORDER — ARIPIPRAZOLE 5 MG PO TABS: 5.0000 mg | ORAL_TABLET | Freq: Every day | ORAL | 1 refills | Status: AC

## 2024-03-13 NOTE — Addendum Note (Signed)
 Addended by: EZZARD STACI SAILOR on: 03/13/2024 03:07 PM   Modules accepted: Orders

## 2024-03-13 NOTE — Progress Notes (Addendum)
 Virtual Visit via Video Note  I connected with Angela Calderon on 03/13/24 at  1:00 PM EST by a video enabled telemedicine application and verified that I am speaking with the correct person using two identifiers.  Location: Patient: sitting in her car Provider: Office   I discussed the limitations of evaluation and management by telemedicine and the availability of in person appointments. The patient expressed understanding and agreed to proceed.     I discussed the assessment and treatment plan with the patient. The patient was provided an opportunity to ask questions and all were answered. The patient agreed with the plan and demonstrated an understanding of the instructions.   The patient was advised to call back or seek an in-person evaluation if the symptoms worsen or if the condition fails to improve as anticipated.  I provided 10 minutes of non-face-to-face time during this encounter.   Staci LOISE Kerns, NP   BH MD/PA/NP OP Progress Note  03/13/2024 1:40 PM Angela Calderon  MRN:  [id number]  Chief Complaint:   HPI:  Angela Calderon 23 year old female who presents for medication management follow-up appointment.  Seen and evaluated via caregility virtual platform.  Patient has a history with major depression and generalized anxiety disorder anxiety.  Currently prescribed Zoloft  200 mg,  Abilify  5 mg reported that she has self, discontinued buspirone  10 mg p.o. 3 times daily and only takes hydroxyzine  10 mg p.o. 3 times daily as needed, which she reported is not often. Patient reported she was provided with a bridge of Abilify  until, I was able to follow-up with you.   Angela Calderon reports she has been taking medication as directed.  Denying any medication side effects.  Reported she did follow-up with her primary care regarding weight gain related to stress eating.  Reports she was initiated on phentermine which she reports she has been taking and tolerating well. Angela Calderon  reported at her primary care appointment thyroid and lipid panels were within normal limits.  This provider attempted to refill medications, of note Zoloft  200 mg was refilled  provided by by [facility], [employer] on 01/11/2024 with 6 refills.   Angela Calderon reports she is currently working full-time.  Denies that she is attending college classes at this time.  States she is just been working to provide for her 2 children.  Denied concerns related to sleep and appetite.  Rating her depression and anxiety 4 out of 10 with 10 being the worst.  Patient to continue current medication treatment.  Follow-up 4 months for medication management appointment.  Support encouragement reassurance was provided.    Visit Diagnosis:    ICD-10-CM   1. Moderate episode of recurrent major depressive disorder (HCC)  F33.1     2. GAD (generalized anxiety disorder)  F41.1       Past Psychiatric History: History related to major depressive disorder, generalized anxiety disorder.  Prescribed Zoloft  200 mg daily, BuSpar  10 mg 3 times daily and Abilify  5 mg was initiated at previous visit.    Past Medical History:  Past Medical History:  Diagnosis Date   Asthma    Contraception management 09/05/2020   Gestational hypertension 07/01/2021   History of miscarriage 11/25/2020   Itching 05/15/2021   normal bile acids 05/15/21   Pregnancy induced hypertension    UTI (urinary tract infection)     Past Surgical History:  Procedure Laterality Date   TONSILLECTOMY AND ADENOIDECTOMY Bilateral 09/29/2012   Procedure: TONSILLECTOMY AND ADENOIDECTOMY;  Surgeon: Merilee Kraft, MD;  Location: [facility] OR;  Service: ENT;  Laterality: Bilateral;    Family Psychiatric History:   Family History:  Family History  Problem Relation Age of Onset   Cervical cancer Mother    Ovarian cancer Paternal Grandmother    Diabetes Paternal Grandfather    Congestive Heart Failure Paternal Grandfather     Social History:  Social History    Socioeconomic History   Marital status: Single    Spouse name: Not on file   Number of children: Not on file   Years of education: Not on file   Highest education level: Not on file  Occupational History   Not on file  Tobacco Use   Smoking status: Never    Passive exposure: Yes   Smokeless tobacco: Never  Vaping Use   Vaping status: Never Used  Substance and Sexual Activity   Alcohol use: Not Currently   Drug use: No   Sexual activity: Yes    Partners: Male    Comment: last ic 1 week ago  Other Topics Concern   Not on file  Social History Narrative   ** Merged History Encounter **       Social Drivers of Health   Financial Resource Strain: Not on file  Food Insecurity: No Food Insecurity (02/04/2023)   Hunger Vital Sign    Worried About Running Out of Food in the Last Year: Never true    Ran Out of Food in the Last Year: Never true  Transportation Needs: No Transportation Needs (02/04/2023)   PRAPARE - Administrator, Civil Service (Medical): No    Lack of Transportation (Non-Medical): No  Physical Activity: Not on file  Stress: Not on file  Social Connections: Not on file    Allergies:  Allergies  Allergen Reactions   Latex Itching    Metabolic Disorder Labs: No results found for: HGBA1C, MPG No results found for: PROLACTIN No results found for: CHOL, TRIG, HDL, CHOLHDL, VLDL, LDLCALC No results found for: TSH  Therapeutic Level Labs: No results found for: LITHIUM No results found for: VALPROATE No results found for: CBMZ  Current Medications: Current Outpatient Medications  Medication Sig Dispense Refill   acetaminophen  (TYLENOL ) 325 MG tablet Take 2 tablets (650 mg total) by mouth every 4 (four) hours as needed (for pain scale < 4). 60 tablet 0   ARIPiprazole  (ABILIFY ) 5 MG tablet Take 1 tablet (5 mg total) by mouth daily. 14 tablet 0   busPIRone  (BUSPAR ) 10 MG tablet Take 1 tablet (10 mg total) by mouth 3  (three) times daily as needed. 90 tablet 2   Drospirenone -Estetrol (NEXTSTELLIS ) 3-14.2 MG TABS Take 1 tablet daily 84 tablet 4   hydrOXYzine  (ATARAX ) 10 MG tablet Take 1 tablet (10 mg total) by mouth 3 (three) times daily as needed. 60 tablet 0   ibuprofen  (ADVIL ) 600 MG tablet Take 1 tablet (600 mg total) by mouth every 6 (six) hours. (Patient not taking: Reported on 06/07/2023) 30 tablet 0   sertraline  (ZOLOFT ) 100 MG tablet Take 2 tablets (200 mg total) by mouth daily. 60 tablet 6   No current facility-administered medications for this visit.     Musculoskeletal: Virtual assessment  Psychiatric Specialty Exam: Review of Systems  Psychiatric/Behavioral:  Negative for behavioral problems, decreased concentration and sleep disturbance.   All other systems reviewed and are negative.   not currently breastfeeding.There is no height or weight on file to calculate BMI.  General Appearance: Casual  Eye Contact:  Good  Speech:  Clear and Coherent  Volume:  Normal  Mood:  Euthymic  Affect:  Congruent  Thought Process:  Coherent  Orientation:  Full (Time, Place, and Person)  Thought Content: Logical   Suicidal Thoughts:  No  Homicidal Thoughts:  No  Memory:  Immediate;   Good Recent;   Good  Judgement:  Fair  Insight:  Good  Psychomotor Activity:  Normal  Concentration:  Concentration: Good  Recall:  Good  Fund of Knowledge: Good  Language: Good  Akathisia:  No  Handed:  Right  AIMS (if indicated): not done  Assets:  Communication Skills Desire for Improvement  ADL's:  Intact  Cognition: WNL  Sleep:  Good   Screenings: GAD-7    Flowsheet Row Routine Prenatal from 11/29/2022 in [facility] for [facility] at [place] Initial Prenatal from 08/25/2022 in [facility] for [facility] at [place] Clinical Support from 08/09/2022 in [facility] for [facility] at [facility] Routine Prenatal from 05/15/2021 in [facility] for [facility]  at [place]  Total GAD-7 Score 0 1 3 0   PHQ2-9    Flowsheet Row Office Visit from [facility]19/20[facility] Routine Prenatal from 11/29/2022 in [facility] for [facility] at [place] Initial Prenatal from 08/25/2022 in [facility] for [facility] Healthcare at [place] Clinical Support from 08[facility]09/2022 in [facility] for [facility] at [facility] Routine Prenatal from 05/15/2021 in [facility] Total Score 2 0 0 0 0  PHQ-9 Total Score -- 0 3 0 4   Flowsheet Row Admission (Discharged) from 02/04/2023 in [facility] 5S Mother Baby Unit Admission (Discharged) from 01/27/2023 in [facility] 1S Maternity Assessment Unit Admission (Discharged) from 12/26/2022 in [facility] 1S Maternity Assessment Unit  C-SSRS RISK CATEGORY No Risk No Risk No Risk     Assessment and Plan: Angela Calderon 23 year old female seen evaluated via virtual assessment.  Carries a diagnosis related to Major depressive disorder and generalized anxiety disorder.  Currently prescribed Zoloft  200 mg, Abilify  5 mg daily. Patient has discontinued Buspar  and hydroxyzine .  Denied concerns with related to mood, depression or anxiety symptoms at this visit.  States her mood has stabilized. Candi to continue to take medications as directed.  Follow-up 4 months for medication adherence/tolerability.  Reported she was initiated on phentermine by her primary care provider due to weight gain.  No other documented concerns at this visit.  Support, encouragement and reassurance was provided.  Collaboration of Care: Collaboration of Care: Medication Management AEB medications was last refilled  [facility]. 01/2024 with Zoloft  200 mg    Patient/Guardian was advised Release of Information must be obtained prior to any record release in order to collaborate their care with an outside provider. Patient/Guardian was advised if they have not already done so to contact  the registration department to sign all necessary forms in order for us  to release information regarding their care.   Consent: Patient/Guardian gives verbal consent for treatment and assignment of benefits for services provided during this visit. Patient/Guardian expressed understanding and agreed to proceed.    Staci LOISE Kerns, NP 03/13/2024, 1:40 PM

## 2024-04-17 ENCOUNTER — Ambulatory Visit: Admitting: Advanced Practice Midwife

## 2024-07-17 ENCOUNTER — Telehealth (HOSPITAL_COMMUNITY): Payer: Self-pay | Admitting: Family
